# Patient Record
Sex: Male | Born: 1955 | Race: White | Hispanic: No | Marital: Single | Smoking: Former smoker
Health system: Southern US, Community
[De-identification: ages and names within clinical notes are randomized; demographics above are authoritative.]

## PROBLEM LIST (undated history)

## (undated) DIAGNOSIS — E271 Primary adrenocortical insufficiency: Secondary | ICD-10-CM

## (undated) DIAGNOSIS — L97509 Non-pressure chronic ulcer of other part of unspecified foot with unspecified severity: Secondary | ICD-10-CM

## (undated) DIAGNOSIS — E11621 Type 2 diabetes mellitus with foot ulcer: Secondary | ICD-10-CM

## (undated) DIAGNOSIS — E119 Type 2 diabetes mellitus without complications: Secondary | ICD-10-CM

## (undated) DIAGNOSIS — K219 Gastro-esophageal reflux disease without esophagitis: Secondary | ICD-10-CM

## (undated) DIAGNOSIS — F319 Bipolar disorder, unspecified: Secondary | ICD-10-CM

## (undated) DIAGNOSIS — IMO0001 Reserved for inherently not codable concepts without codable children: Secondary | ICD-10-CM

## (undated) DIAGNOSIS — F4310 Post-traumatic stress disorder, unspecified: Secondary | ICD-10-CM

## (undated) DIAGNOSIS — M549 Dorsalgia, unspecified: Secondary | ICD-10-CM

## (undated) DIAGNOSIS — E1142 Type 2 diabetes mellitus with diabetic polyneuropathy: Secondary | ICD-10-CM

## (undated) DIAGNOSIS — Z8719 Personal history of other diseases of the digestive system: Secondary | ICD-10-CM

## (undated) DIAGNOSIS — G43909 Migraine, unspecified, not intractable, without status migrainosus: Secondary | ICD-10-CM

## (undated) DIAGNOSIS — G4733 Obstructive sleep apnea (adult) (pediatric): Secondary | ICD-10-CM

## (undated) DIAGNOSIS — Z8739 Personal history of other diseases of the musculoskeletal system and connective tissue: Secondary | ICD-10-CM

## (undated) DIAGNOSIS — J42 Unspecified chronic bronchitis: Secondary | ICD-10-CM

## (undated) DIAGNOSIS — F32A Depression, unspecified: Secondary | ICD-10-CM

## (undated) DIAGNOSIS — I10 Essential (primary) hypertension: Secondary | ICD-10-CM

## (undated) DIAGNOSIS — J449 Chronic obstructive pulmonary disease, unspecified: Secondary | ICD-10-CM

## (undated) DIAGNOSIS — Z8711 Personal history of peptic ulcer disease: Secondary | ICD-10-CM

## (undated) DIAGNOSIS — G8929 Other chronic pain: Secondary | ICD-10-CM

## (undated) DIAGNOSIS — F419 Anxiety disorder, unspecified: Secondary | ICD-10-CM

## (undated) DIAGNOSIS — M869 Osteomyelitis, unspecified: Secondary | ICD-10-CM

## (undated) DIAGNOSIS — F209 Schizophrenia, unspecified: Secondary | ICD-10-CM

## (undated) DIAGNOSIS — S069XAA Unspecified intracranial injury with loss of consciousness status unknown, initial encounter: Secondary | ICD-10-CM

## (undated) DIAGNOSIS — Z9989 Dependence on other enabling machines and devices: Secondary | ICD-10-CM

## (undated) DIAGNOSIS — S069X9A Unspecified intracranial injury with loss of consciousness of unspecified duration, initial encounter: Secondary | ICD-10-CM

## (undated) DIAGNOSIS — M1990 Unspecified osteoarthritis, unspecified site: Secondary | ICD-10-CM

## (undated) DIAGNOSIS — F329 Major depressive disorder, single episode, unspecified: Secondary | ICD-10-CM

## (undated) HISTORY — DX: Post-traumatic stress disorder, unspecified: F43.10

## (undated) HISTORY — PX: WRIST FRACTURE SURGERY: SHX121

## (undated) HISTORY — DX: Schizophrenia, unspecified: F20.9

## (undated) HISTORY — DX: Osteomyelitis, unspecified: M86.9

## (undated) HISTORY — DX: Anxiety disorder, unspecified: F41.9

## (undated) HISTORY — DX: Type 2 diabetes mellitus with foot ulcer: E11.621

## (undated) HISTORY — PX: ANKLE SURGERY: SHX546

## (undated) HISTORY — DX: Gastro-esophageal reflux disease without esophagitis: K21.9

## (undated) HISTORY — PX: BELOW KNEE LEG AMPUTATION: SUR23

## (undated) HISTORY — DX: Non-pressure chronic ulcer of other part of unspecified foot with unspecified severity: L97.509

## (undated) HISTORY — DX: Depression, unspecified: F32.A

## (undated) HISTORY — DX: Major depressive disorder, single episode, unspecified: F32.9

---

## 1998-04-15 ENCOUNTER — Emergency Department (HOSPITAL_COMMUNITY): Admission: EM | Admit: 1998-04-15 | Discharge: 1998-04-15 | Payer: Self-pay | Admitting: Emergency Medicine

## 1998-10-03 ENCOUNTER — Encounter: Payer: Self-pay | Admitting: Emergency Medicine

## 1998-10-03 ENCOUNTER — Emergency Department (HOSPITAL_COMMUNITY): Admission: EM | Admit: 1998-10-03 | Discharge: 1998-10-03 | Payer: Self-pay | Admitting: Emergency Medicine

## 1998-10-09 ENCOUNTER — Encounter: Admission: RE | Admit: 1998-10-09 | Discharge: 1998-10-28 | Payer: Self-pay | Admitting: Sports Medicine

## 1998-10-24 ENCOUNTER — Encounter: Payer: Self-pay | Admitting: Sports Medicine

## 1998-10-24 ENCOUNTER — Ambulatory Visit (HOSPITAL_COMMUNITY): Admission: RE | Admit: 1998-10-24 | Discharge: 1998-10-24 | Payer: Self-pay | Admitting: Sports Medicine

## 1998-11-01 ENCOUNTER — Encounter: Payer: Self-pay | Admitting: Sports Medicine

## 1998-11-01 ENCOUNTER — Ambulatory Visit (HOSPITAL_COMMUNITY): Admission: RE | Admit: 1998-11-01 | Discharge: 1998-11-01 | Payer: Self-pay | Admitting: Sports Medicine

## 1998-11-13 ENCOUNTER — Encounter: Admission: RE | Admit: 1998-11-13 | Discharge: 1998-12-03 | Payer: Self-pay | Admitting: Sports Medicine

## 1998-12-13 ENCOUNTER — Inpatient Hospital Stay (HOSPITAL_COMMUNITY): Admission: EM | Admit: 1998-12-13 | Discharge: 1998-12-17 | Payer: Self-pay | Admitting: Emergency Medicine

## 1998-12-13 ENCOUNTER — Encounter: Payer: Self-pay | Admitting: Emergency Medicine

## 1998-12-30 ENCOUNTER — Encounter: Admission: RE | Admit: 1998-12-30 | Discharge: 1999-02-26 | Payer: Self-pay | Admitting: Sports Medicine

## 1998-12-31 ENCOUNTER — Encounter: Admission: RE | Admit: 1998-12-31 | Discharge: 1998-12-31 | Payer: Self-pay | Admitting: Family Medicine

## 1999-05-20 ENCOUNTER — Ambulatory Visit (HOSPITAL_COMMUNITY): Admission: RE | Admit: 1999-05-20 | Discharge: 1999-05-20 | Payer: Self-pay | Admitting: Internal Medicine

## 1999-05-20 ENCOUNTER — Encounter: Payer: Self-pay | Admitting: Internal Medicine

## 1999-10-08 ENCOUNTER — Encounter: Payer: Self-pay | Admitting: Oral Surgery

## 1999-10-08 ENCOUNTER — Encounter: Admission: RE | Admit: 1999-10-08 | Discharge: 1999-10-08 | Payer: Self-pay | Admitting: Oral Surgery

## 2000-02-09 ENCOUNTER — Ambulatory Visit (HOSPITAL_COMMUNITY): Admission: RE | Admit: 2000-02-09 | Discharge: 2000-02-09 | Payer: Self-pay | Admitting: *Deleted

## 2004-01-20 ENCOUNTER — Ambulatory Visit: Payer: Self-pay | Admitting: Family Medicine

## 2004-05-10 ENCOUNTER — Ambulatory Visit: Payer: Self-pay | Admitting: Family Medicine

## 2010-10-01 ENCOUNTER — Emergency Department (HOSPITAL_COMMUNITY): Payer: Medicaid Other

## 2010-10-01 ENCOUNTER — Emergency Department (HOSPITAL_COMMUNITY)
Admission: EM | Admit: 2010-10-01 | Discharge: 2010-10-01 | Disposition: A | Discharge status: Home / Self Care | Payer: Medicaid Other | Attending: Emergency Medicine | Admitting: Emergency Medicine

## 2010-10-01 DIAGNOSIS — Z5900 Homelessness unspecified: Secondary | ICD-10-CM | POA: Insufficient documentation

## 2010-10-01 DIAGNOSIS — E1149 Type 2 diabetes mellitus with other diabetic neurological complication: Secondary | ICD-10-CM | POA: Insufficient documentation

## 2010-10-01 DIAGNOSIS — E1142 Type 2 diabetes mellitus with diabetic polyneuropathy: Secondary | ICD-10-CM | POA: Insufficient documentation

## 2010-10-01 LAB — DIFFERENTIAL
Basophils Absolute: 0 10*3/uL (ref 0.0–0.1)
Basophils Relative: 0 % (ref 0–1)
Eosinophils Absolute: 0.1 10*3/uL (ref 0.0–0.7)
Eosinophils Relative: 1 % (ref 0–5)
Lymphocytes Relative: 19 % (ref 12–46)
Lymphs Abs: 1.5 10*3/uL (ref 0.7–4.0)
Monocytes Absolute: 0.5 10*3/uL (ref 0.1–1.0)
Monocytes Relative: 6 % (ref 3–12)
Neutro Abs: 5.8 10*3/uL (ref 1.7–7.7)
Neutrophils Relative %: 74 % (ref 43–77)

## 2010-10-01 LAB — COMPREHENSIVE METABOLIC PANEL
ALT: 16 U/L (ref 0–53)
Alkaline Phosphatase: 92 U/L (ref 39–117)
BUN: 9 mg/dL (ref 6–23)
CO2: 22 mEq/L (ref 19–32)
Chloride: 86 mEq/L — ABNORMAL LOW (ref 96–112)
GFR calc Af Amer: >60 mL/min (ref >60)
GFR calc non Af Amer: >60 mL/min (ref >60)
Glucose, Bld: 590 mg/dL (ref 70–99)
Potassium: 4.4 mEq/L (ref 3.5–5.1)
Sodium: 122 mEq/L — ABNORMAL LOW (ref 135–145)
Total Bilirubin: 0.4 mg/dL (ref 0.3–1.2)
Total Protein: 6.3 g/dL (ref 6.0–8.3)

## 2010-10-01 LAB — CBC
HCT: 34.7 % — ABNORMAL LOW (ref 39.0–52.0)
Hemoglobin: 12 g/dL — ABNORMAL LOW (ref 13.0–17.0)
MCH: 28.8 pg (ref 26.0–34.0)
MCHC: 34.6 g/dL (ref 30.0–36.0)
MCV: 83.4 fL (ref 78.0–100.0)
Platelets: 356 10*3/uL (ref 150–400)
RBC: 4.16 MIL/uL — ABNORMAL LOW (ref 4.22–5.81)
RDW: 13.3 % (ref 11.5–15.5)
WBC: 7.9 10*3/uL (ref 4.0–10.5)

## 2010-10-01 LAB — URINALYSIS, ROUTINE W REFLEX MICROSCOPIC
Bilirubin Urine: NEGATIVE
Hgb urine dipstick: NEGATIVE
Ketones, ur: NEGATIVE mg/dL
Nitrite: NEGATIVE
Protein, ur: NEGATIVE mg/dL
Urobilinogen, UA: 0.2 mg/dL (ref 0.0–1.0)

## 2010-10-01 LAB — GLUCOSE, CAPILLARY: Glucose-Capillary: 200 mg/dL — ABNORMAL HIGH (ref 70–99)

## 2010-10-01 LAB — ETHANOL: Alcohol, Ethyl (B): <11 mg/dL (ref 0–11)

## 2010-10-01 LAB — RAPID URINE DRUG SCREEN, HOSP PERFORMED
Amphetamines: NOT DETECTED
Barbiturates: NOT DETECTED
Opiates: NOT DETECTED
Tetrahydrocannabinol: NOT DETECTED

## 2010-10-15 ENCOUNTER — Emergency Department (HOSPITAL_COMMUNITY): Payer: Medicaid Other

## 2010-10-15 ENCOUNTER — Inpatient Hospital Stay (HOSPITAL_COMMUNITY)
Admission: EM | Admit: 2010-10-15 | Discharge: 2010-10-24 | DRG: 041 | Disposition: A | Discharge status: Skilled Nursing Facility | Payer: Medicaid Other | Attending: Family Medicine | Admitting: Family Medicine

## 2010-10-15 DIAGNOSIS — F410 Panic disorder [episodic paroxysmal anxiety] without agoraphobia: Secondary | ICD-10-CM | POA: Diagnosis present

## 2010-10-15 DIAGNOSIS — A4901 Methicillin susceptible Staphylococcus aureus infection, unspecified site: Secondary | ICD-10-CM | POA: Diagnosis present

## 2010-10-15 DIAGNOSIS — L97409 Non-pressure chronic ulcer of unspecified heel and midfoot with unspecified severity: Secondary | ICD-10-CM | POA: Diagnosis present

## 2010-10-15 DIAGNOSIS — L02619 Cutaneous abscess of unspecified foot: Secondary | ICD-10-CM | POA: Diagnosis present

## 2010-10-15 DIAGNOSIS — E1149 Type 2 diabetes mellitus with other diabetic neurological complication: Principal | ICD-10-CM | POA: Diagnosis present

## 2010-10-15 DIAGNOSIS — F209 Schizophrenia, unspecified: Secondary | ICD-10-CM | POA: Diagnosis present

## 2010-10-15 DIAGNOSIS — K219 Gastro-esophageal reflux disease without esophagitis: Secondary | ICD-10-CM | POA: Diagnosis present

## 2010-10-15 DIAGNOSIS — Z7982 Long term (current) use of aspirin: Secondary | ICD-10-CM

## 2010-10-15 DIAGNOSIS — E871 Hypo-osmolality and hyponatremia: Secondary | ICD-10-CM | POA: Diagnosis present

## 2010-10-15 DIAGNOSIS — I10 Essential (primary) hypertension: Secondary | ICD-10-CM | POA: Diagnosis present

## 2010-10-15 DIAGNOSIS — L03119 Cellulitis of unspecified part of limb: Secondary | ICD-10-CM | POA: Diagnosis present

## 2010-10-15 DIAGNOSIS — D72829 Elevated white blood cell count, unspecified: Secondary | ICD-10-CM | POA: Diagnosis present

## 2010-10-15 DIAGNOSIS — Z794 Long term (current) use of insulin: Secondary | ICD-10-CM

## 2010-10-15 DIAGNOSIS — M9080 Osteopathy in diseases classified elsewhere, unspecified site: Secondary | ICD-10-CM | POA: Diagnosis present

## 2010-10-15 DIAGNOSIS — E876 Hypokalemia: Secondary | ICD-10-CM | POA: Diagnosis present

## 2010-10-15 DIAGNOSIS — IMO0002 Reserved for concepts with insufficient information to code with codable children: Secondary | ICD-10-CM | POA: Diagnosis present

## 2010-10-15 DIAGNOSIS — F4310 Post-traumatic stress disorder, unspecified: Secondary | ICD-10-CM | POA: Diagnosis present

## 2010-10-15 DIAGNOSIS — D62 Acute posthemorrhagic anemia: Secondary | ICD-10-CM | POA: Diagnosis not present

## 2010-10-15 DIAGNOSIS — G4733 Obstructive sleep apnea (adult) (pediatric): Secondary | ICD-10-CM | POA: Diagnosis present

## 2010-10-15 DIAGNOSIS — M869 Osteomyelitis, unspecified: Secondary | ICD-10-CM | POA: Diagnosis present

## 2010-10-15 DIAGNOSIS — E1142 Type 2 diabetes mellitus with diabetic polyneuropathy: Secondary | ICD-10-CM | POA: Diagnosis present

## 2010-10-15 DIAGNOSIS — E1165 Type 2 diabetes mellitus with hyperglycemia: Secondary | ICD-10-CM | POA: Diagnosis present

## 2010-10-15 LAB — CBC
Hemoglobin: 13.4 g/dL (ref 13.0–17.0)
Platelets: 403 10*3/uL — ABNORMAL HIGH (ref 150–400)
RBC: 4.59 MIL/uL (ref 4.22–5.81)
WBC: 16.5 10*3/uL — ABNORMAL HIGH (ref 4.0–10.5)

## 2010-10-15 LAB — DIFFERENTIAL
Basophils Relative: 0 % (ref 0–1)
Eosinophils Absolute: 0 10*3/uL (ref 0.0–0.7)
Neutro Abs: 13.9 10*3/uL — ABNORMAL HIGH (ref 1.7–7.7)
Neutrophils Relative %: 84 % — ABNORMAL HIGH (ref 43–77)

## 2010-10-15 LAB — URINALYSIS, ROUTINE W REFLEX MICROSCOPIC
Glucose, UA: >1000 mg/dL — AB
Hgb urine dipstick: NEGATIVE
Ketones, ur: 40 mg/dL — AB
Leukocytes, UA: NEGATIVE
Protein, ur: NEGATIVE mg/dL
pH: 5 (ref 5.0–8.0)

## 2010-10-15 LAB — URINE MICROSCOPIC-ADD ON

## 2010-10-15 LAB — BASIC METABOLIC PANEL
CO2: 25 mEq/L (ref 19–32)
Chloride: 83 mEq/L — ABNORMAL LOW (ref 96–112)
Glucose, Bld: 418 mg/dL — ABNORMAL HIGH (ref 70–99)
Potassium: 4.2 mEq/L (ref 3.5–5.1)
Sodium: 121 mEq/L — ABNORMAL LOW (ref 135–145)

## 2010-10-15 LAB — POCT I-STAT 3, VENOUS BLOOD GAS (G3P V)
Bicarbonate: 27.4 mEq/L — ABNORMAL HIGH (ref 20.0–24.0)
TCO2: 29 mmol/L (ref 0–100)
pCO2, Ven: 40 mmHg — ABNORMAL LOW (ref 45.0–50.0)
pH, Ven: 7.444 — ABNORMAL HIGH (ref 7.250–7.300)
pO2, Ven: 29 mmHg — CL (ref 30.0–45.0)

## 2010-10-16 LAB — BASIC METABOLIC PANEL
BUN: 9 mg/dL (ref 6–23)
CO2: 27 mEq/L (ref 19–32)
Calcium: 8.7 mg/dL (ref 8.4–10.5)
Calcium: 8.6 mg/dL (ref 8.4–10.5)
Chloride: 92 mEq/L — ABNORMAL LOW (ref 96–112)
Creatinine, Ser: 0.53 mg/dL (ref 0.50–1.35)
Creatinine, Ser: 0.56 mg/dL (ref 0.50–1.35)
GFR calc Af Amer: >60 mL/min (ref >60)
GFR calc non Af Amer: >60 mL/min (ref >60)

## 2010-10-16 LAB — GLUCOSE, CAPILLARY
Glucose-Capillary: 251 mg/dL — ABNORMAL HIGH (ref 70–99)
Glucose-Capillary: 179 mg/dL — ABNORMAL HIGH (ref 70–99)
Glucose-Capillary: 151 mg/dL — ABNORMAL HIGH (ref 70–99)
Glucose-Capillary: 281 mg/dL — ABNORMAL HIGH (ref 70–99)
Glucose-Capillary: 284 mg/dL — ABNORMAL HIGH (ref 70–99)
Glucose-Capillary: 207 mg/dL — ABNORMAL HIGH (ref 70–99)

## 2010-10-16 LAB — HEMOGLOBIN A1C
Hgb A1c MFr Bld: 14.1 % — ABNORMAL HIGH
Mean Plasma Glucose: 358 mg/dL — ABNORMAL HIGH

## 2010-10-16 LAB — LIPID PANEL
Cholesterol: 129 mg/dL (ref 0–200)
LDL Cholesterol: 74 mg/dL (ref 0–99)
VLDL: 13 mg/dL (ref 0–40)

## 2010-10-16 LAB — URIC ACID: Uric Acid, Serum: 2.1 mg/dL — ABNORMAL LOW (ref 4.0–7.8)

## 2010-10-16 LAB — CORTISOL-AM, BLOOD: Cortisol - AM: 14.4 ug/dL (ref 4.3–22.4)

## 2010-10-16 LAB — OSMOLALITY
Osmolality: 268 mosm/kg — ABNORMAL LOW (ref 275–300)
Osmolality: 272 mosm/kg — ABNORMAL LOW (ref 275–300)

## 2010-10-17 ENCOUNTER — Inpatient Hospital Stay (HOSPITAL_COMMUNITY): Payer: Medicaid Other

## 2010-10-17 LAB — GLUCOSE, CAPILLARY
Glucose-Capillary: 146 mg/dL — ABNORMAL HIGH (ref 70–99)
Glucose-Capillary: 194 mg/dL — ABNORMAL HIGH (ref 70–99)

## 2010-10-17 LAB — COMPREHENSIVE METABOLIC PANEL
ALT: 8 U/L (ref 0–53)
Calcium: 8.7 mg/dL (ref 8.4–10.5)
Creatinine, Ser: 0.57 mg/dL (ref 0.50–1.35)
GFR calc Af Amer: >60 mL/min (ref >60)
Glucose, Bld: 241 mg/dL — ABNORMAL HIGH (ref 70–99)
Sodium: 133 mEq/L — ABNORMAL LOW (ref 135–145)
Total Protein: 5.7 g/dL — ABNORMAL LOW (ref 6.0–8.3)

## 2010-10-17 LAB — CBC
Hemoglobin: 12 g/dL — ABNORMAL LOW (ref 13.0–17.0)
MCH: 28.8 pg (ref 26.0–34.0)
MCHC: 35 g/dL (ref 30.0–36.0)
MCV: 82.5 fL (ref 78.0–100.0)
Platelets: 387 10*3/uL (ref 150–400)

## 2010-10-17 MED ORDER — GADOBENATE DIMEGLUMINE 529 MG/ML IV SOLN
15.0000 mL | Freq: Once | INTRAVENOUS | Status: AC | PRN
  Administered 2010-10-17: 15 mL via INTRAVENOUS

## 2010-10-18 ENCOUNTER — Inpatient Hospital Stay (HOSPITAL_COMMUNITY): Payer: Medicaid Other

## 2010-10-18 LAB — PROTIME-INR
INR: 1.01 (ref 0.00–1.49)
Prothrombin Time: 13.5 seconds (ref 11.6–15.2)

## 2010-10-18 LAB — BASIC METABOLIC PANEL
BUN: 4 mg/dL — ABNORMAL LOW (ref 6–23)
CO2: 27 mEq/L (ref 19–32)
Calcium: 8.7 mg/dL (ref 8.4–10.5)
Creatinine, Ser: 0.54 mg/dL (ref 0.50–1.35)
GFR calc non Af Amer: >60 mL/min (ref >60)
Glucose, Bld: 194 mg/dL — ABNORMAL HIGH (ref 70–99)
Sodium: 134 mEq/L — ABNORMAL LOW (ref 135–145)

## 2010-10-18 LAB — CBC
HCT: 33.5 % — ABNORMAL LOW (ref 39.0–52.0)
Hemoglobin: 11.6 g/dL — ABNORMAL LOW (ref 13.0–17.0)
MCH: 28.6 pg (ref 26.0–34.0)
MCHC: 34.6 g/dL (ref 30.0–36.0)
MCV: 82.5 fL (ref 78.0–100.0)
RBC: 4.06 MIL/uL — ABNORMAL LOW (ref 4.22–5.81)

## 2010-10-18 LAB — WOUND CULTURE

## 2010-10-18 LAB — GLUCOSE, CAPILLARY: Glucose-Capillary: 253 mg/dL — ABNORMAL HIGH (ref 70–99)

## 2010-10-18 LAB — APTT: aPTT: 30 seconds (ref 24–37)

## 2010-10-18 LAB — LIPID PANEL
LDL Cholesterol: 73 mg/dL (ref 0–99)
VLDL: 20 mg/dL (ref 0–40)

## 2010-10-19 ENCOUNTER — Other Ambulatory Visit: Payer: Self-pay | Admitting: Orthopedic Surgery

## 2010-10-19 DIAGNOSIS — L02619 Cutaneous abscess of unspecified foot: Secondary | ICD-10-CM

## 2010-10-19 DIAGNOSIS — E1169 Type 2 diabetes mellitus with other specified complication: Secondary | ICD-10-CM

## 2010-10-19 DIAGNOSIS — L03119 Cellulitis of unspecified part of limb: Secondary | ICD-10-CM

## 2010-10-19 HISTORY — PX: FOOT TENDON TRANSFER: SHX1671

## 2010-10-19 HISTORY — PX: BONE RESECTION: SHX897

## 2010-10-19 LAB — CBC
Platelets: 435 10*3/uL — ABNORMAL HIGH (ref 150–400)
RBC: 3.91 MIL/uL — ABNORMAL LOW (ref 4.22–5.81)
WBC: 6.7 10*3/uL (ref 4.0–10.5)

## 2010-10-19 LAB — BASIC METABOLIC PANEL
CO2: 26 mEq/L (ref 19–32)
Chloride: 102 mEq/L (ref 96–112)
Potassium: 3 mEq/L — ABNORMAL LOW (ref 3.5–5.1)
Sodium: 137 mEq/L (ref 135–145)

## 2010-10-19 LAB — GLUCOSE, CAPILLARY
Glucose-Capillary: 138 mg/dL — ABNORMAL HIGH (ref 70–99)
Glucose-Capillary: 148 mg/dL — ABNORMAL HIGH (ref 70–99)
Glucose-Capillary: 184 mg/dL — ABNORMAL HIGH (ref 70–99)
Glucose-Capillary: 177 mg/dL — ABNORMAL HIGH (ref 70–99)

## 2010-10-19 NOTE — H&P (Signed)
NAME:  [name], [name] NO.:  [id number]  MEDICAL RECORD NO.:  [id number]  LOCATION:  [facility]                         FACILITY:  [facility]  PHYSICIAN:  Eduard Clos, MDDATE OF BIRTH:  Aug 30, 1955  DATE OF ADMISSION:  10/15/2010 DATE OF DISCHARGE:                             HISTORY & PHYSICAL   The patient's primary care physician is Dr. Della Goo.  CHIEF COMPLAINT:  Left foot swelling.  HISTORY OF PRESENT ILLNESS:  A 55 year old male with known history of diabetes mellitus type 2, hypertension, neuropathy, post traumatic stress disorder, schizophrenia, sleep apnea, not on CPAP presented to the ER because of increasing swelling of the left foot over the last 2 days.  In the ER the patient was noticed to have swelling which the ER PA, Josh Geiple did I and D.  He stated minimal pus came out but the whole lesion was around 5 cm in diameter.  X-ray did not show anything involving the bone.  The patient had leukocytosis of 16 and blood sugar of 418 on admission.  The patient is admitted for further workup on his left foot cellulitis and abscess with uncontrolled diabetes.  The patient states that he has been taking his medications regularly. He said he did have some subjective feeling of fever or chills.  Denies any chest pain, shortness of breath, nausea, vomiting, abdominal pain, dysuria, discharge, or diarrhea.  Denies any focal deficit, headache, or visual symptoms.  PAST MEDICAL HISTORY:  History of OSA.  The patient has not been using his CPAP.  History of migraine headache, history of diabetic neuropathy, history of panic attack, history of post-traumatic stress disorder, history of schizophrenia, history of diabetes mellitus type 2, and history of hypertension.  PAST SURGICAL HISTORY:  Right ankle surgery for fracture.  MEDICATIONS ON ADMISSION:  The patient states that he takes lisinopril, metformin 500 mg in the morning and 1000 at bedtime,  Nexium, fluoxetine, Benadryl, amitriptyline.  We need to verify all the medication doses.  ALLERGIES:  No known drug allergies.  FAMILY HISTORY:  Positive for lung cancer and heart attack and diabetes in his dad.  SOCIAL HISTORY:  The patient lives with his mom.  Denies smoking cigarettes, drinking alcohol, or using illegal drugs.  REVIEW OF SYSTEMS:  As per the history of present illness, nothing else significant.  PHYSICAL EXAMINATION:  GENERAL:  The patient examined at bedside not in acute distress. VITAL SIGNS:  Blood pressure 149/90, pulse 85 per minute, temperature 97.3, respirations 18 per minute, O2 sat 100%. HEENT: Anicteric.  No pallor.  No discharge from ears, eyes, nose, or mouth. CHEST:  Bilateral air entry present.  No rhonchi.  No crepitation. HEART:  S1 and S2. ABDOMEN:  Soft and nontender.  Bowel sounds heard. CNS:  Patient is alert, awake, and oriented to time, place, and person. Moves upper and lower extremities 5/5.  EXTREMITIES:  There is mild swelling of his left foot, on the dorsal aspect which is already addressed, After I and D., it is mildly tender to touch.  There is no acute ischemic changes, cyanosis, or clubbing.  The right foot looks normal.  LABORATORY DATA:  CBC:  WBC 16.5, hemoglobin is  13.4, hematocrit 37.3, platelets 403, neutrophils 84%.  Basic metabolic panel sodium 121, potassium 4.2, chloride 83, carbon dioxide 25, glucose 418 anion gap is 13, BUN 10, creatinine 0.6, calcium 9.4.  UA appears cloudy more than 1000 glucose, ketones 40, negative for nitrites and leukocytes.  X-ray of the left foot shows no acute fracture or subluxation.  No periosteal reaction or bony erosion.  Soft tissue swelling adjacent to the base of the fifth metatarsal the patient did have a CT of the head without contrast on October 01, 2010, that was negative for anything acute.  ASSESSMENT: 1. Left foot abscess and cellulitis. 2. Severe hyperglycemia.  Secondary  to uncontrolled diabetes mellitus     type 2, not in diabetic ketoacidosis. 3. Hyponatremia. 4  History of schizophrenia, depression, anxiety and post-traumatic stress disorder. 1. History of obstructive sleep apnea, he has not been using CPAP. 2. History of hypertension. 3. History of  migraine headaches.  PLAN: 1. At this time, we will admit the patient to telemetry. 2. For his left foot abscess and cellulitis.  The patient already had     an I and D.  I am going to get an MRI of the left foot to make sure     there is no osteomyelitis.  We will continue with the Zenaida Niece and Zosyn     and the I and D specimen is sent for cultures and 70 which we will     follow. 3. For his severe hyperglycemia with uncontrolled diabetes.  The     patient was on Glucommander and his blood sugar had not decreased     to 179 and we have stopped the Glucommander.  I am going to give     Lantus 10 units.  We will check a hemoglobin A1c at this time the     patient will be on CBG coverage with moderate sliding scale with no     bedtime coverage. 4. Severe hyponatremia.  I do not know the exact cause for it at this     time, I am hydrating the patient with normal saline.  We are going     to check a stat urine sodium osmolality and chloride and we will     also check plasma protein and cortisol level and uric acid level.     I am going to get a stat BMET and we will be following BMET q.4     hourly for next 4 times to see how his sodium is trending.  Based     on these, we will see if patient needs to     be restricted fluids or to be given IV fluids. 5. Hypertension.  We need to verify his home medication at this time,     we will keep patient n.p.o. on IV hydralazine.  Further     recommendation based on test order and clinic course.     Eduard Clos, MD     ANK/MEDQ  D:  10/16/2010  T:  10/16/2010  Job:  [id number]  cc:   Della Goo, M.D.  Electronically Signed by Midge Minium MD  on 10/19/2010 09:49:48 AM

## 2010-10-20 HISTORY — PX: INCISE AND DRAIN ABCESS: PRO64

## 2010-10-20 LAB — CBC
Hemoglobin: 10.1 g/dL — ABNORMAL LOW (ref 13.0–17.0)
MCV: 84 fL (ref 78.0–100.0)
Platelets: 386 10*3/uL (ref 150–400)
RBC: 3.5 MIL/uL — ABNORMAL LOW (ref 4.22–5.81)
WBC: 8.1 10*3/uL (ref 4.0–10.5)

## 2010-10-20 LAB — BASIC METABOLIC PANEL
CO2: 26 mEq/L (ref 19–32)
Calcium: 8.8 mg/dL (ref 8.4–10.5)
Creatinine, Ser: 0.6 mg/dL (ref 0.50–1.35)
Glucose, Bld: 138 mg/dL — ABNORMAL HIGH (ref 70–99)

## 2010-10-20 LAB — GLUCOSE, CAPILLARY
Glucose-Capillary: 147 mg/dL — ABNORMAL HIGH (ref 70–99)
Glucose-Capillary: 145 mg/dL — ABNORMAL HIGH (ref 70–99)
Glucose-Capillary: 96 mg/dL (ref 70–99)

## 2010-10-20 NOTE — Op Note (Signed)
[name], [name] NO.:  [id number]  MEDICAL RECORD NO.:  [id number]  LOCATION:  2036                         FACILITY:  [facility]  PHYSICIAN:  Toni Arthurs, MD        DATE OF BIRTH:  1955-04-08  DATE OF PROCEDURE:  10/19/2010 DATE OF DISCHARGE:                              OPERATIVE REPORT   DATE OF SURGERY:  October 19, 2010.  PREOPERATIVE DIAGNOSES: 1. Left fifth metatarsal base osteomyelitis. 2. Left foot diabetic ulcer. 3. Left foot deep abscess.  POSTOPERATIVE DIAGNOSES: 1. Left fifth metatarsal base osteomyelitis. 2. Left foot diabetic ulcer. 3. Left foot deep abscess.  PROCEDURE: 1. Left ankle peroneus brevis to peroneus longus tendon transfer. 2. Left foot ulcer I and D of superficial and deep tissue including     bone. 3. Left fifth metatarsal base excision. 4. Application of wound VAC to left foot wound.  SURGEON:  Toni Arthurs, M.D.  ANESTHESIA:  General.  IV FLUIDS:  See anesthesia record.  ESTIMATED BLOOD LOSS:  50 mL.  TOURNIQUET TIME:  3 minutes with an ankle Esmarch.  COMPLICATIONS:  None apparent.  DISPOSITION:  Extubated, awake, and stable to recovery.  SPECIMENS:  Deep tissue to Microbiology for culture and Gram stain.  DISPOSITION:  Extubated, awake, and stable to recovery.  INDICATIONS FOR PROCEDURE:  The patient is a 55 year old male with a past medical history significant for type 2 diabetes.  He was admitted to the Internal Medicine Service with cellulitis and a diabetic- foot ulcer. An MRI was obtained showing osteomyelitis of the base of the fifth metatarsal as well as subcutaneous and deep abscesses around the fifth metatarsal base.  He presents now for operative treatment of this condition.  He understands risks of bleeding, infection, nerve damage, blood clots, need for additional surgery, failure to heal, amputation, and death.  He elects to proceed.  PROCEDURE IN DETAIL:  After preoperative consent was  obtained, the correct operative site was identified, the patient was brought to the operating room and placed supine on the operating table and general anesthesia was induced.  The patient was already on therapeutic antibiotics from the Inpatient Ward.  Surgical time-out was taken.  A left lower extremity was then prepped and draped in standard sterile fashion.  The peroneal tendon sheath was identified.  A longitudinal incision was made over the tendon sheath and dissection was carried down through the subcutaneous tissue to the level of the sheath.  The sheath was opened.  Peroneus brevis and peroneus longus tendons were identified.  A curette was used to roughen the surfaces of both tendons. They were then sutured together with horizontal mattress sutures of 2-0 Ethibond.  The wound was irrigated.  The subcutaneous tissue was closed with inverted simple sutures of 3-0 Monocryl.  The skin was closed with horizontal sutures of 2-0 nylon.  At this point, attention was then turned to the ulcer.  This was noted to be 2 cm x 2 cm over the base of the fifth metatarsal.  There is gross purulence noted from the area of the I and D performed in the emergency room.  The wound was ellipsed out with a #10 blade carrying  the incision down to the level of bone at the lateral aspect of the base of the fifth metatarsal.  Subperiosteal dissection was carried along the fifth metatarsal base plantarly and dorsally.  Sagittal saw was then used to transect the fifth metatarsal at its mid-shaft.  The base was then removed in its entirety and passed off the field as a specimen. Portions of the fifth metatarsal base were then taken with a rongeur and passed off as a specimen to Microbiology.  The subcutaneous tissue was then explored and debrided of all necrotic tissue.  The wound was irrigated copiously with 3 liters of normal saline.  Hemostasis was achieved.  A wound VAC sponge was placed into the depths  of the wound and the occlusive dressing was placed over the sponge.  Vacuum was applied and the sponge was noted to have the appropriate seal.  Sterile dressings were applied over the lateral incision.  The entire foot and ankle were then wrapped with Webril and Ace wrap.  The patient was then awakened by Anesthesia and transported to recovery room in stable condition.  FOLLOWUP PLAN:  The patient will remain on therapeutic antibiotics.  He will need a return trip to the operating room in 2-3 days for repeat I and D, and hopefully closure of the wound.     Toni Arthurs, MD     JH/MEDQ  D:  10/19/2010  T:  10/19/2010  Job:  [id number]  Electronically Signed by Toni Arthurs  on 10/20/2010 09:21:35 PM

## 2010-10-21 ENCOUNTER — Inpatient Hospital Stay (HOSPITAL_COMMUNITY): Payer: Medicaid Other

## 2010-10-21 LAB — GLUCOSE, CAPILLARY
Glucose-Capillary: 160 mg/dL — ABNORMAL HIGH (ref 70–99)
Glucose-Capillary: 203 mg/dL — ABNORMAL HIGH (ref 70–99)
Glucose-Capillary: 241 mg/dL — ABNORMAL HIGH (ref 70–99)
Glucose-Capillary: 282 mg/dL — ABNORMAL HIGH (ref 70–99)
Glucose-Capillary: 295 mg/dL — ABNORMAL HIGH (ref 70–99)
Glucose-Capillary: 319 mg/dL — ABNORMAL HIGH (ref 70–99)

## 2010-10-22 ENCOUNTER — Inpatient Hospital Stay (HOSPITAL_COMMUNITY): Payer: Medicaid Other

## 2010-10-22 LAB — COMPREHENSIVE METABOLIC PANEL
Albumin: 2.5 g/dL — ABNORMAL LOW (ref 3.5–5.2)
BUN: 6 mg/dL (ref 6–23)
Creatinine, Ser: 0.57 mg/dL (ref 0.50–1.35)
Total Bilirubin: 0.1 mg/dL — ABNORMAL LOW (ref 0.3–1.2)
Total Protein: 5.5 g/dL — ABNORMAL LOW (ref 6.0–8.3)

## 2010-10-22 LAB — GLUCOSE, CAPILLARY
Glucose-Capillary: 125 mg/dL — ABNORMAL HIGH (ref 70–99)
Glucose-Capillary: 171 mg/dL — ABNORMAL HIGH (ref 70–99)

## 2010-10-22 LAB — TISSUE CULTURE

## 2010-10-22 LAB — CBC
HCT: 28.8 % — ABNORMAL LOW (ref 39.0–52.0)
MCH: 28.7 pg (ref 26.0–34.0)
MCHC: 34.7 g/dL (ref 30.0–36.0)
MCV: 82.8 fL (ref 78.0–100.0)
RDW: 13.5 % (ref 11.5–15.5)

## 2010-10-23 LAB — GLUCOSE, CAPILLARY: Glucose-Capillary: 331 mg/dL — ABNORMAL HIGH (ref 70–99)

## 2010-10-23 NOTE — Op Note (Signed)
[name], COWGILL NO.:  [id number]  MEDICAL RECORD NO.:  [id number]  LOCATION:  2036                         FACILITY:  [facility]  PHYSICIAN:  Toni Arthurs, MD        DATE OF BIRTH:  1955/06/14  DATE OF PROCEDURE:  10/22/2010 DATE OF DISCHARGE:                              OPERATIVE REPORT   PREOPERATIVE DIAGNOSIS:  Left foot diabetic ulcer with underlying osteomyelitis status post incision and drainage and resection of the fifth metatarsal base with wound VAC application on October 19, 2010.  PROCEDURES: 1. Repeat I and D of left foot wound. 2. Application of wound VAC to left foot wound (1 cm x 3 cm).  SURGEON:  Toni Arthurs, MD  ANESTHESIA:  General.  IV FLUIDS:  See anesthesia record.  ESTIMATED BLOOD LOSS:  Minimal.  TOURNIQUET TIME:  0.  COMPLICATIONS:  None apparent.  DISPOSITION:  Extubated awake and stable to recovery.  INDICATIONS FOR PROCEDURE:  The patient is a 55 year old male with uncontrolled diabetes who presented earlier in a week for diabetic foot ulcer with surrounding cellulitis.  I took him to surgery on October 19, 2010, and I and D'ed the diabetic ulcer and resected the fifth metatarsal base which had osteomyelitis per MRI.  He also had a wound VAC placed at that time, he presents now for a planned return to the operating room to I and D the wound again and possibly close it versus place a wound VAC back on the wound.  He understands the risks and benefits of this procedure and elects to proceed.  Specifically, he understands the risks of bleeding, infection, nerve damage, blood clots, need for additional surgery, amputation, and death.  PROCEDURE IN DETAIL:  After preoperative consent was obtained and the correct operative site was identified, the patient was brought to the operating room and placed supine on the operating table.  General anesthesia was induced.  Preoperative antibiotics were administered. Surgical  time-out was taken.  The left lower extremity was prepped and draped in standard sterile fashion.  The patient's previous diabetic ulcer was identified.  The wound was circumferentially debrided from superficial to deep including skin, subcutaneous tissue, muscle, and bone.  The wound was irrigated copiously with 3 liters of normal saline. The underlying soft tissue was closed with inverted simple sutures of 2- 0 Monocryl.  The distal half of the wound was closed with horizontal mattress sutures of 2-0 nylon, proximal end the wound could not be closed in a tension-free manner.  A wound VAC sponge was cut to fit this section of the wound which measured 1 cm x 3 cm and was approximately a centimeter deep.  This wound VAC was placed into the wound and Adaptic was placed over the distal close incision.  A wound VAC sponge was placed over this as well and occlusive dressing was applied.  Vacuum of 125 mmHg was applied through the wound VAC apparatus and this achieved an adequate seal.  Sterile dressings were applied followed by a Cam walker boot.  The patient was then awakened by Anesthesia and transported to recovery room in stable condition.  FOLLOWUP PLAN:  The patient will have wound VAC  changes Monday, Wednesday, Friday.  He will stay on antibiotics per the Primary Team and he will followup with me in clinic in approximately 2 weeks for a wound check.     Toni Arthurs, MD     JH/MEDQ  D:  10/22/2010  T:  10/22/2010  Job:  [id number]  Electronically Signed by Jonny Ruiz Elverda Wendel  on 10/23/2010 08:51:28 AM

## 2010-10-24 DIAGNOSIS — E11621 Type 2 diabetes mellitus with foot ulcer: Secondary | ICD-10-CM

## 2010-10-24 DIAGNOSIS — M869 Osteomyelitis, unspecified: Secondary | ICD-10-CM

## 2010-10-24 HISTORY — DX: Type 2 diabetes mellitus with foot ulcer: E11.621

## 2010-10-24 HISTORY — DX: Osteomyelitis, unspecified: M86.9

## 2010-10-24 LAB — GLUCOSE, CAPILLARY: Glucose-Capillary: 279 mg/dL — ABNORMAL HIGH (ref 70–99)

## 2010-10-24 LAB — ANAEROBIC CULTURE: Gram Stain: NONE SEEN

## 2010-10-29 DIAGNOSIS — E11621 Type 2 diabetes mellitus with foot ulcer: Secondary | ICD-10-CM | POA: Insufficient documentation

## 2010-10-29 DIAGNOSIS — M869 Osteomyelitis, unspecified: Secondary | ICD-10-CM | POA: Insufficient documentation

## 2010-11-06 NOTE — Discharge Summary (Signed)
NAME:  [name], [name] NO.:  [id number]  MEDICAL RECORD NO.:  [id number]  LOCATION:                                 FACILITY:  PHYSICIAN:  Pleas Koch, MD        DATE OF BIRTH:  1955-08-13  DATE OF ADMISSION:  10/16/2010 DATE OF DISCHARGE:                              DISCHARGE SUMMARY   DISCHARGE DIAGNOSES: 1. Left fifth metatarsal base osteomyelitis with left diabetic foot     ulcer and deep abscess status post I and D October 20, 2010 with     revision and closure and wound VAC placement October 22, 2010. 2. Diabetes mellitus with a HbA1c of 48.1. 3. Transient hypokalemia. 4. Gastroesophageal reflux disease on Nexium. 5. Obstructive sleep apnea. 6. History of posttraumatic stress disorder and long psychiatric     history. 7. Hypotension which was transient and now resolved after decreasing     his dose of lisinopril. 8. Status post placement of PICC line, right upper extremity for long-     term antibiotics, currently on day #4 out of 56 of Ancef 2 grams     daily.  DISCHARGE MEDICATIONS: 1. Amitriptyline 100 mg 1 tab q.p.m. 2. Fluoxetine 40 mg 1 tab q.p.m. 3. Benadryl 25 mg 1 tab q.p.m. 4. Flonase 2 sprays b.i.d. 5. Please note change in medication from lisinopril 10 mg to     lisinopril 5 mg secondary to mild hypotension. 6. Aspirin 81 mg q.p.m. 7. Nexium 40 mg 1 cap q.a.m.  MEDICATIONS DISCONTINUED:  Aleve, Bonine, and over-the-counter sleep medicine.  NEW MEDICATIONS: 1. Hydromorphone 0.5 mg q.6 p.r.n., quantity sufficient for 4 days,     prescription written. 2. Hydrocodone/APAP 1 tab p.o. q.6 p.r.n., quantity sufficient for 5     days, 20 tablets given. 3. NovoLog insulin 1-15 units per sensitivity sliding scale coverage     t.i.d. with meals. 4. Lantus 8 units q.p.m. 5. Cefazolin 2 grams q.8 hourly for duration of 52 days to complete a     full 8-week course of antibiotics for osteomyelitis of the foot.  PERTINENT IMAGING  STUDIES: 1. X-ray of foot October 15, 2010 showed no acute fracture subluxation.     No periosteal reaction or bony erosions.  Subsequent soft tissue     swelling adjacent to base of left fifth metatarsal. 2. MRI of foot October 17, 2010 showed cellulitis and myositis.     a.     Soft tissue abscess and osteomyelitis involving the fifth      metatarsal base laterally. 3. Chest x-ray portable October 18, 2010 showed low lung volumes,     minimal left basilar atelectasis.  Final chest x-ray on October 22, 2010 showed no acute cardiopulmonary abnormality.  Right PICC     line in place.  PERTINENT CONSULTS:  Dr. Toni Arthurs of Orthopedics and Infectious Disease both Dr. Paulette Blanch Dam and Dr. Enedina Finner.  This is a very pleasant 55 year old male with multiple medical issues who presented to the emergency room because swelling of foot over the last 2 days.  He was noted to have swelling in the ED and the PA did  an I and D which showed minimal pus but the whole lesion is 5 cm in diameter.  He had leukocytosis of 16, blood sugar of 418, and was admitted for left foot cellulitis and abscess and uncontrolled diabetes mellitus.  The patient endorsed taking medications regularly.  He did have some subjective chills, fever.  No chest pain, shortness of breath. No nausea, vomiting, no discharge, no diarrhea.  No focal deficit or visual symptoms.  VITAL SIGNS ON ADMISSION:  Not in acute distress, blood pressure 149/90, pulse 85, temperature 97.3, respirations 18, O2 sats 100%.  PERTINENT POSITIVES ON ADMISSION:  Mild swelling of left foot on dorsal aspect which already was dressed after I and D.  It is mildly tender to touch.  No acute ischemic changes, cyanosis, clubbing.  Right foot looks normal.  PERTINENT LAB DATA ON ADMISSION:  WBC 16.5, sodium 121, anion gap 13, BUN and creatinine 10/0.6, CO2 is 25.  Pertinent positives above for imaging.  HOSPITAL COURSE ACCORDING TO  ISSUES: 1. Cellulitis/abscess/osteomyelitis.  Dr. Toni Arthurs was consulted     regarding the above issues and performed left ankle peroneus brevis     to peroneus longus tendon transfer.     a.     Left foot ulcer I and D of superficial and deep tissue      including bone.     b.     Left fifth metatarsal base excision.     c.     Application of wound VAC to left foot wound on October 20, 2010.  He did a revision surgery on October 22, 2010 with      application of wound VAC on left wound and is to be seen in about      2 weeks for wound check and is to have wound changes Monday,      Wednesday, Friday per his explicit instructions.  The patient is      doing well from this standpoint.  His WBC today is 6.3 and he is      now on day #4 of day 55 of Ancef q.8.  The duration of antibiotics      and the course of antibiotics were streamlined with the help of      Infectious Disease and the patient will need close followup with      Infectious Disease in about 1-2 weeks.  The patient will need to      follow up in Infectious Disease Clinic at phone number [phone] as an      outpatient and this should be arranged and coordinated through  nursing home physician as well the patient will need to follow up      with Orthopedics, Dr. Victorino Dike in 10 days to 2 weeks at phone number      [phone]. 2. Uncontrolled diabetes mellitus.  A1c of 14.1.  The patient had a     nutrition counseling and there was gross deficit of knowledge with     regards to diet as well as other issues.  The patient will be     discharged to nursing facility.  It is noted that the patient has     very poor vision secondary to not being able to have his reading     glasses and cannot drop insulin on his own and it would be     appropriate to have him monitored closely for insulin needs and     wound needs  at skilled nursing facility as there is a potential for     his wound to decompensate and ultimately lose his left  lower     extremity if tight blood sugar control is not entailed. 3. Hyponatremia, hypokalemia, and other volume related issues were     likely secondary to uncontrolled diabetes mellitus.  His last     sodium is 138 on September5, 2012. 4. GERD.  The patient will continue on Nexium 40 mg daily as a     preferred medication as this helps him better than Protonix. 5. Obstructive sleep apnea.  The patient has a diagnosis of the same     but cannot tolerate CPAP and does use Flonase b.i.d. for this     purpose.  We will continue the same. 6. Multiple psychiatric illnesses.  The patient has PTSD and will be     discharged on his home Prozac and Elavil doses as an outpatient.     He may need some followup with the psychiatrist then.  The patient     was very pleasant and cooperative during this hospitalization. 7. Hypertension.  The patient actually had 1 episode of slight     hypotension which prompted [place] to lower his dose of lisinopril.  He     is doing well currently and is euvolemic.  The patient was seen on day of discharge, was doing well, had no nausea, no vomiting, no chest pain.  He was tolerating full meals.  He was concerned about his foot and the patient was reassured by Dr. Victorino Dike and myself with regards to the fact that he will probably do well if his blood sugars are well controlled.  He will need a nursing facility to monitor him closely and the patient is stable for discharge currently.  DISCHARGE EXAMINATION:  Anxious affect.  No pallor.  No icterus.  Neck is soft, non-supple, nontender.  Chest clinically clear.  No tactile vocal fremitus or resonance.  Abdomen is soft, nontender, nondistended. Wound is wrapped in air stirrup and brace and wound was examined this morning by Dr. Victorino Dike and looked clean to him with the wound VAC not having any significant drainage.  The patient is ambulant with minimal assistance with a walker.  The patient will be discharged to skilled  nursing facility [facility] when bed is available.  It was a pleasure taking care of this patient.  Please note that this dictation is not finalized until signed by dictating physician.          ______________________________ Pleas Koch, MD     JS/MEDQ  D:  10/23/2010  T:  10/23/2010  Job:  [id number]  cc:   Della Goo, M.D. Toni Arthurs, MD Lacretia Leigh. Ninetta Lights, M.D. Acey Lav, MD  Electronically Signed by Pleas Koch MD on 11/06/2010 09:51:30 PM

## 2010-11-06 NOTE — Discharge Summary (Signed)
  NAME:  [name], [name] NO.:  [id number]  MEDICAL RECORD NO.:  [id number]  LOCATION:                                 FACILITY:  PHYSICIAN:  Pleas Koch, MD        DATE OF BIRTH:  12-06-1955  DATE OF ADMISSION: DATE OF DISCHARGE:                              DISCHARGE SUMMARY   ADDENDUM  The patient stayed 1 more day as there was no wound VAC available at nursing facility.  The patient was seen on the day of discharge and was doing great.  Vitals as follows:  Temperature 98.3, pulse 69, respirations 18, blood pressure 120-118/74-80.  He had no concerns and no other issues and will follow up as an outpatient.  He will get wet-to- dry dressings before leaving and will be discharged with PICC line and may discontinue Foley.          ______________________________ Pleas Koch, MD     JS/MEDQ  D:  10/24/2010  T:  10/24/2010  Job:  [id number]  Electronically Signed by Pleas Koch MD on 11/06/2010 09:51:37 PM

## 2010-11-08 ENCOUNTER — Ambulatory Visit (INDEPENDENT_AMBULATORY_CARE_PROVIDER_SITE_OTHER): Payer: Medicaid Other | Admitting: Infectious Disease

## 2010-11-08 ENCOUNTER — Encounter: Payer: Self-pay | Admitting: Infectious Disease

## 2010-11-08 DIAGNOSIS — A4901 Methicillin susceptible Staphylococcus aureus infection, unspecified site: Secondary | ICD-10-CM | POA: Insufficient documentation

## 2010-11-08 DIAGNOSIS — L97509 Non-pressure chronic ulcer of other part of unspecified foot with unspecified severity: Secondary | ICD-10-CM

## 2010-11-08 DIAGNOSIS — L03119 Cellulitis of unspecified part of limb: Secondary | ICD-10-CM

## 2010-11-08 DIAGNOSIS — M869 Osteomyelitis, unspecified: Secondary | ICD-10-CM

## 2010-11-08 DIAGNOSIS — L02612 Cutaneous abscess of left foot: Secondary | ICD-10-CM | POA: Insufficient documentation

## 2010-11-08 DIAGNOSIS — E1169 Type 2 diabetes mellitus with other specified complication: Secondary | ICD-10-CM

## 2010-11-08 DIAGNOSIS — E11621 Type 2 diabetes mellitus with foot ulcer: Secondary | ICD-10-CM

## 2010-11-08 NOTE — Progress Notes (Signed)
  Subjective:    Patient ID: [name], male    DOB: [birth year], 55 y.o.   MRN: [id number]  HPI   55 year old male with known history of diabetes mellitus type 2, hypertension, neuropathy, post traumatic   stress disorder, schizophrenia, sleep apnea who was admitted to [facility] with osteomyelitis of the foot. He underwent two separate I and D of bone by Dr Victorino Dike with I and D of left foot abscess and resection of part of the 5th metatarsal with MSSA growing from cultures. He was narrowed to cefazolin and remains on this since dc in late September. He has been followed closely by Dr. Victorino Dike who has changed him to a wound vaccuum dressing. His orthopedic situation is tenuous and he may in fact ultimately require a BKA for cure. He is planned for 8 weeks of parenteral antibiotics and presents to clinic today for followup. His vaccuum dressing was already deployed and not taken down in anticipation of his visit something that I woul like done for the next visit. Otherwise he appears to be doing relatively well without fevers, chills nausea or systemic symptoms  Review of Systems  Constitutional: Negative for fever, chills, diaphoresis, activity change, appetite change, fatigue and unexpected weight change.  HENT: Negative for congestion, sore throat, rhinorrhea, sneezing, trouble swallowing and sinus pressure.   Eyes: Negative for photophobia and visual disturbance.  Respiratory: Negative for cough, chest tightness, shortness of breath, wheezing and stridor.   Cardiovascular: Negative for chest pain, palpitations and leg swelling.  Gastrointestinal: Negative for nausea, vomiting, abdominal pain, diarrhea, constipation, blood in stool, abdominal distention and anal bleeding.  Genitourinary: Negative for dysuria, hematuria, flank pain and difficulty urinating.  Musculoskeletal: Negative for myalgias, back pain, joint swelling, arthralgias and gait problem.  Skin: Positive for wound. Negative for  color change, pallor and rash.  Neurological: Negative for dizziness, tremors, weakness and light-headedness.  Hematological: Negative for adenopathy. Does not bruise/bleed easily.  Psychiatric/Behavioral: Negative for behavioral problems, confusion, sleep disturbance, dysphoric mood, decreased concentration and agitation.       Objective:   Physical Exam  Constitutional: He is oriented to person, place, and time. He appears well-nourished. No distress.  HENT:  Head: Normocephalic and atraumatic.  Mouth/Throat: Oropharynx is clear and moist. No oropharyngeal exudate.  Eyes: Conjunctivae and EOM are normal. Pupils are equal, round, and reactive to light. No scleral icterus.  Neck: Normal range of motion. Neck supple. No JVD present.  Cardiovascular: Normal rate, regular rhythm and normal heart sounds.  Exam reveals no gallop and no friction rub.   No murmur heard. Pulmonary/Chest: Effort normal and breath sounds normal. No respiratory distress. He has no wheezes. He has no rales. He exhibits no tenderness.  Abdominal: He exhibits no distension and no mass. There is no tenderness. There is no rebound and no guarding.  Musculoskeletal: He exhibits no edema and no tenderness.       Feet:       vaccuum dressing in place. Toes well perfused. Musty odor from dressing  Lymphadenopathy:    He has no cervical adenopathy.  Neurological: He is alert and oriented to person, place, and time. He has normal reflexes. He exhibits normal muscle tone. Coordination normal.  Skin: Skin is warm and dry. He is not diaphoretic. There is erythema. No pallor.  Psychiatric: He has a normal mood and affect. His behavior is normal. Judgment and thought content normal.          Assessment & Plan:

## 2010-11-08 NOTE — Patient Instructions (Addendum)
We will continue the iv antibiotics for now Return to see dr Zenaida Niece dam in late October  if wound vaccuum still being used i would like it taken down before visit with me

## 2010-11-08 NOTE — Assessment & Plan Note (Signed)
Continue IV ancef for minimum of 8 weeks. Bring back to see me at end of OCtober and examine without wound vaccuum, with recheck esr, crp and consider change to po antibiotics. If he failes this approach he will likely need bka

## 2010-11-08 NOTE — Assessment & Plan Note (Signed)
See above plan. 

## 2010-11-08 NOTE — Assessment & Plan Note (Signed)
Consider decontamination regimen int the future

## 2010-11-08 NOTE — Assessment & Plan Note (Signed)
Defer dm to Dr. Leanord Hawking at [facility] : [facility]

## 2010-11-25 ENCOUNTER — Emergency Department (HOSPITAL_COMMUNITY): Payer: Medicaid Other

## 2010-11-25 ENCOUNTER — Emergency Department (HOSPITAL_COMMUNITY)
Admission: EM | Admit: 2010-11-25 | Discharge: 2010-11-25 | Disposition: A | Discharge status: Home / Self Care | Payer: Medicaid Other | Attending: Emergency Medicine | Admitting: Emergency Medicine

## 2010-11-25 DIAGNOSIS — M25519 Pain in unspecified shoulder: Secondary | ICD-10-CM | POA: Insufficient documentation

## 2010-11-25 DIAGNOSIS — F3289 Other specified depressive episodes: Secondary | ICD-10-CM | POA: Insufficient documentation

## 2010-11-25 DIAGNOSIS — F329 Major depressive disorder, single episode, unspecified: Secondary | ICD-10-CM | POA: Insufficient documentation

## 2010-11-25 DIAGNOSIS — Z79899 Other long term (current) drug therapy: Secondary | ICD-10-CM | POA: Insufficient documentation

## 2010-11-25 DIAGNOSIS — E119 Type 2 diabetes mellitus without complications: Secondary | ICD-10-CM | POA: Insufficient documentation

## 2010-11-25 DIAGNOSIS — I10 Essential (primary) hypertension: Secondary | ICD-10-CM | POA: Insufficient documentation

## 2010-12-06 ENCOUNTER — Encounter: Payer: Self-pay | Admitting: Infectious Disease

## 2010-12-08 ENCOUNTER — Encounter: Payer: Self-pay | Admitting: Infectious Disease

## 2010-12-08 ENCOUNTER — Ambulatory Visit (INDEPENDENT_AMBULATORY_CARE_PROVIDER_SITE_OTHER): Payer: Medicaid Other | Admitting: Infectious Disease

## 2010-12-08 VITALS — BP 68/34 | HR 96 | Temp 97.5°F | Wt 155.0 lb

## 2010-12-08 DIAGNOSIS — E1140 Type 2 diabetes mellitus with diabetic neuropathy, unspecified: Secondary | ICD-10-CM

## 2010-12-08 DIAGNOSIS — E1149 Type 2 diabetes mellitus with other diabetic neurological complication: Secondary | ICD-10-CM

## 2010-12-08 DIAGNOSIS — L02612 Cutaneous abscess of left foot: Secondary | ICD-10-CM

## 2010-12-08 DIAGNOSIS — M869 Osteomyelitis, unspecified: Secondary | ICD-10-CM

## 2010-12-08 DIAGNOSIS — A4901 Methicillin susceptible Staphylococcus aureus infection, unspecified site: Secondary | ICD-10-CM

## 2010-12-08 DIAGNOSIS — I10 Essential (primary) hypertension: Secondary | ICD-10-CM | POA: Insufficient documentation

## 2010-12-08 DIAGNOSIS — L02619 Cutaneous abscess of unspecified foot: Secondary | ICD-10-CM

## 2010-12-08 DIAGNOSIS — I959 Hypotension, unspecified: Secondary | ICD-10-CM

## 2010-12-08 DIAGNOSIS — E1142 Type 2 diabetes mellitus with diabetic polyneuropathy: Secondary | ICD-10-CM

## 2010-12-08 NOTE — Assessment & Plan Note (Signed)
Status post I&D seems to be improving we'll set to see this does off antibiotics ultimately.

## 2010-12-08 NOTE — Assessment & Plan Note (Signed)
Check sedimentation rate C-reactive protein today complete 8 weeks of IV Ancef then see him back a month after that.

## 2010-12-08 NOTE — Assessment & Plan Note (Signed)
Needs close followup by his primary care physician

## 2010-12-08 NOTE — Assessment & Plan Note (Signed)
Culprit pathogen

## 2010-12-08 NOTE — Patient Instructions (Signed)
Make sure that you drink plenty of fluids such as gatorade to keep yourself hydrated

## 2010-12-08 NOTE — Assessment & Plan Note (Addendum)
Currently asymptomatic although as far multiple times in the past he has learned to stand closed carefully and slowly. He states he has had chronic low blood pressures which makes her wonder why he was given an ACE inhibitor. He is no longer on lisinopril. I will check a serum cortisol as I wondered whether this patient could have some adrenal insufficiency. We attempted bolus of fluids in the clinic but could not achieve IV access in left arm and could not bolus him through his PICC line per protocol clinic. On repeat blood pressure check of his left leg his blood pressure is noted in the one teens systolic. His infection is well controlled at present.

## 2010-12-08 NOTE — Progress Notes (Signed)
Subjective:    Patient ID: [name], male    DOB: [birth year], 55 y.o.   MRN: [id number]  HPI  55 year old male with known history of diabetes mellitus type 2, hypertension, neuropathy, post traumatic  stress disorder, schizophrenia, sleep apnea who was admitted to [facility] with osteomyelitis of the foot. He underwent two separate I and D of bone by Dr Victorino Dike with I and D of left foot abscess and resection of part of the 5th metatarsal with MSSA growing from cultures. He was narrowed to cefazolin and remains on this since dc in late September. He has been followed closely by Dr. Victorino Dike who has changed him to a wound vaccuum dressing. His orthopedic situation has been tenuous and he may in fact ultimately require a BKA for cure. He was planned for 8 weeks of parenteral antibiotics. I saw him a month ago. He is been seen since by Dr. Londell Moh who is unhappy with how the patient is progressing. Vacuum is been removed. Today his BP initially was in 60s initially on check here in arm but on recheck was in the 110s. He is without symptoms today. He does have a history of multiple falls prior to his admission in [facility]. We spent greater than 50% of time in face to face counselling of the pt and in coordination of his care including atttempts to place an IV and bolus him with IVF prior to the bp check in the leg.   Review of Systems  Constitutional: Negative for fever, chills, diaphoresis, activity change, appetite change, fatigue and unexpected weight change.  HENT: Negative for congestion, sore throat, rhinorrhea, sneezing, trouble swallowing and sinus pressure.   Eyes: Negative for photophobia and visual disturbance.  Respiratory: Negative for cough, chest tightness, shortness of breath, wheezing and stridor.   Cardiovascular: Negative for chest pain, palpitations and leg swelling.  Gastrointestinal: Negative for nausea, vomiting, abdominal pain, diarrhea, constipation, blood in stool, abdominal  distention and anal bleeding.  Genitourinary: Negative for dysuria, hematuria, flank pain and difficulty urinating.  Musculoskeletal: Negative for myalgias, back pain, joint swelling, arthralgias and gait problem.  Skin: Negative for color change, pallor, rash and wound.  Neurological: Negative for dizziness, tremors, weakness and light-headedness.  Hematological: Negative for adenopathy. Does not bruise/bleed easily.  Psychiatric/Behavioral: Negative for behavioral problems, confusion, sleep disturbance, dysphoric mood, decreased concentration and agitation.       Objective:   Physical Exam  Constitutional: He is oriented to person, place, and time. He appears well-developed and well-nourished. No distress.  HENT:  Head: Normocephalic and atraumatic.  Mouth/Throat: Oropharynx is clear and moist. No oropharyngeal exudate.  Eyes: Conjunctivae and EOM are normal. Pupils are equal, round, and reactive to light. No scleral icterus.  Neck: Normal range of motion. Neck supple. No JVD present.  Cardiovascular: Normal rate, regular rhythm and normal heart sounds.  Exam reveals no gallop and no friction rub.   No murmur heard. Pulmonary/Chest: Effort normal and breath sounds normal. No respiratory distress. He has no wheezes. He has no rales. He exhibits no tenderness.  Abdominal: He exhibits no distension and no mass. There is no tenderness. There is no rebound and no guarding.  Musculoskeletal: He exhibits no edema and no tenderness.       Left ankle: He exhibits swelling. tenderness.       Feet:  Lymphadenopathy:    He has no cervical adenopathy.  Neurological: He is alert and oriented to person, place, and time. He has normal reflexes. He exhibits  normal muscle tone. Coordination normal.  Skin: Skin is warm and dry. He is not diaphoretic. There is erythema. No pallor.  Psychiatric: He has a normal mood and affect. His behavior is normal. Judgment and thought content normal.            Assessment & Plan:  Hypotension Currently asymptomatic although as far multiple times in the past he has learned to stand closed carefully and slowly. He states he has had chronic low blood pressures which makes her wonder why he was given an ACE inhibitor. He is no longer on lisinopril. I will check a serum cortisol as I wondered whether this patient could have some adrenal insufficiency. We attempted bolus of fluids in the clinic but could not achieve IV access in left arm and could not bolus him through his PICC line per protocol clinic. On repeat blood pressure check of his left leg his blood pressure is noted in the one teens systolic. His infection is well controlled at present.  Osteomyelitis of toe of left foot Check sedimentation rate C-reactive protein today complete 8 weeks of IV Ancef then see him back a month after that.  Foot abscess, left Status post I&D seems to be improving we'll set to see this does off antibiotics ultimately.  MSSA (methicillin susceptible Staphylococcus aureus) infection Culprit pathogen  Diabetes mellitus with neuropathy Needs close followup by his primary care physician

## 2010-12-09 LAB — CORTISOL: Cortisol, Plasma: 14.8 ug/dL

## 2010-12-09 LAB — SEDIMENTATION RATE: Sed Rate: 4 mm/hr (ref 0–16)

## 2010-12-09 LAB — C-REACTIVE PROTEIN: CRP: 0.56 mg/dL (ref <0.60)

## 2010-12-13 ENCOUNTER — Encounter: Payer: Self-pay | Admitting: Infectious Disease

## 2010-12-13 ENCOUNTER — Telehealth: Payer: Self-pay | Admitting: *Deleted

## 2010-12-13 NOTE — Telephone Encounter (Signed)
Angie, a nurse from [facility] called for orders to pull PICC tomorrow after last dose. Her # is [phone]  Also ,fyi, pt is refusing last set of labs  To Dr. Ninetta Lights as Dr. Daiva Eves is away

## 2010-12-13 NOTE — Telephone Encounter (Signed)
I gave Angie the order to pull the picc after the last dose per Dr. Ninetta Lights. Asked her to have hiim call tomorrow to make an appt for 1 month from now with Dr,. [facility]

## 2011-01-10 ENCOUNTER — Ambulatory Visit (INDEPENDENT_AMBULATORY_CARE_PROVIDER_SITE_OTHER): Payer: Medicaid Other | Admitting: Infectious Disease

## 2011-01-10 ENCOUNTER — Encounter: Payer: Self-pay | Admitting: Infectious Disease

## 2011-01-10 ENCOUNTER — Ambulatory Visit (HOSPITAL_COMMUNITY)
Admission: RE | Admit: 2011-01-10 | Discharge: 2011-01-10 | Disposition: A | Discharge status: Home / Self Care | Payer: Medicaid Other | Source: Ambulatory Visit | Attending: Infectious Disease | Admitting: Infectious Disease

## 2011-01-10 VITALS — BP 78/49 | HR 96 | Temp 97.3°F | Wt 162.1 lb

## 2011-01-10 DIAGNOSIS — E119 Type 2 diabetes mellitus without complications: Secondary | ICD-10-CM | POA: Insufficient documentation

## 2011-01-10 DIAGNOSIS — L02612 Cutaneous abscess of left foot: Secondary | ICD-10-CM

## 2011-01-10 DIAGNOSIS — M869 Osteomyelitis, unspecified: Secondary | ICD-10-CM

## 2011-01-10 DIAGNOSIS — L02619 Cutaneous abscess of unspecified foot: Secondary | ICD-10-CM

## 2011-01-10 LAB — CBC WITH DIFFERENTIAL/PLATELET
Basophils Absolute: 0 10*3/uL (ref 0.0–0.1)
Eosinophils Relative: 3 % (ref 0–5)
Lymphocytes Relative: 23 % (ref 12–46)
Neutro Abs: 4.3 10*3/uL (ref 1.7–7.7)
Neutrophils Relative %: 69 % (ref 43–77)
Platelets: 436 10*3/uL — ABNORMAL HIGH (ref 150–400)
RDW: 15 % (ref 11.5–15.5)
WBC: 6.2 10*3/uL (ref 4.0–10.5)

## 2011-01-10 LAB — BASIC METABOLIC PANEL WITH GFR
Calcium: 9.3 mg/dL (ref 8.4–10.5)
GFR, Est African American: >89 mL/min
Glucose, Bld: 451 mg/dL — ABNORMAL HIGH (ref 70–99)
Sodium: 129 mEq/L — ABNORMAL LOW (ref 135–145)

## 2011-01-10 LAB — SEDIMENTATION RATE: Sed Rate: 1 mm/hr (ref 0–16)

## 2011-01-10 LAB — C-REACTIVE PROTEIN: CRP: 0.09 mg/dL (ref <0.60)

## 2011-01-10 NOTE — Assessment & Plan Note (Signed)
See above plan. He is to see Dr. Samella Parr in mid December

## 2011-01-10 NOTE — Progress Notes (Signed)
  Subjective:    Patient ID: [name], male    DOB: 13-Feb-1956, 55 y.o.   MRN: [id number]  HPI  55 year old male with known history of diabetes mellitus type 2, hypertension, neuropathy, post traumatic  stress disorder, schizophrenia, sleep apnea who was admitted to [facility] with osteomyelitis of the foot. He underwent two separate I and D of bone by Dr Victorino Dike with I and D of left foot abscess and resection of part of the 5th metatarsal with MSSA growing from cultures. He was narrowed to cefazolin and remained  on this since dc in late September. Into October when they were stopped. Since then he continues to have pain in his foot although has not worsened. He has had no fevers or chills return or systemic symptoms. His ankle swelling also persists. Review of Systems  Constitutional: Negative for fever, chills, diaphoresis, activity change, appetite change, fatigue and unexpected weight change.  HENT: Negative for congestion, sore throat, rhinorrhea, sneezing, trouble swallowing and sinus pressure.   Eyes: Negative for photophobia and visual disturbance.  Respiratory: Negative for cough, chest tightness, shortness of breath, wheezing and stridor.   Cardiovascular: Negative for chest pain, palpitations and leg swelling.  Gastrointestinal: Negative for nausea, vomiting, abdominal pain, diarrhea, constipation, blood in stool, abdominal distention and anal bleeding.  Genitourinary: Negative for dysuria, hematuria, flank pain and difficulty urinating.  Musculoskeletal: Positive for myalgias and joint swelling. Negative for back pain, arthralgias and gait problem.  Skin: Positive for wound. Negative for color change, pallor and rash.  Neurological: Negative for dizziness, tremors, weakness and light-headedness.  Hematological: Negative for adenopathy. Does not bruise/bleed easily.  Psychiatric/Behavioral: Negative for behavioral problems, confusion, sleep disturbance, dysphoric mood, decreased  concentration and agitation.       Objective:   Physical Exam  Constitutional: He appears well-developed and well-nourished. No distress.  HENT:  Head: Normocephalic and atraumatic.  Mouth/Throat: Oropharynx is clear and moist. No oropharyngeal exudate.  Eyes: Conjunctivae and EOM are normal. Pupils are equal, round, and reactive to light. No scleral icterus.  Neck: Normal range of motion. Neck supple. No JVD present.  Cardiovascular: Normal rate, regular rhythm and normal heart sounds.  Exam reveals no gallop and no friction rub.   No murmur heard. Pulmonary/Chest: Effort normal and breath sounds normal. No respiratory distress. He has no wheezes. He has no rales. He exhibits no tenderness.  Abdominal: He exhibits no distension and no mass. There is no tenderness. There is no rebound and no guarding.  Musculoskeletal: He exhibits edema and tenderness.       Feet:  Lymphadenopathy:    He has no cervical adenopathy.  Neurological: He is alert. He exhibits normal muscle tone. Coordination normal.  Skin: Skin is warm and dry. He is not diaphoretic. There is erythema. No pallor.  Psychiatric: He has a normal mood and affect. His behavior is normal. Judgment and thought content normal.          Assessment & Plan:  Osteomyelitis of toe of left foot Recheck sedimentation rate C-reactive protein CBC metabolic panel and plain films of the foot and ankle.  Foot abscess, left See above plan. He is to see Dr. Samella Parr in mid December

## 2011-01-10 NOTE — Assessment & Plan Note (Signed)
Recheck sedimentation rate C-reactive protein CBC metabolic panel and plain films of the foot and ankle.

## 2011-02-21 ENCOUNTER — Ambulatory Visit: Payer: Medicaid Other | Admitting: Infectious Disease

## 2011-02-28 ENCOUNTER — Ambulatory Visit: Payer: Medicaid Other | Admitting: Infectious Disease

## 2011-03-04 ENCOUNTER — Ambulatory Visit: Payer: Medicaid Other | Admitting: Infectious Disease

## 2011-03-15 ENCOUNTER — Inpatient Hospital Stay (HOSPITAL_COMMUNITY)
Admission: EM | Admit: 2011-03-15 | Discharge: 2011-03-18 | DRG: 644 | Disposition: A | Discharge status: Home Health | Payer: Medicaid Other | Attending: Internal Medicine | Admitting: Internal Medicine

## 2011-03-15 ENCOUNTER — Inpatient Hospital Stay (HOSPITAL_COMMUNITY): Payer: Medicaid Other

## 2011-03-15 ENCOUNTER — Encounter (HOSPITAL_COMMUNITY): Payer: Self-pay | Admitting: Emergency Medicine

## 2011-03-15 DIAGNOSIS — F209 Schizophrenia, unspecified: Secondary | ICD-10-CM | POA: Diagnosis present

## 2011-03-15 DIAGNOSIS — Z794 Long term (current) use of insulin: Secondary | ICD-10-CM

## 2011-03-15 DIAGNOSIS — K219 Gastro-esophageal reflux disease without esophagitis: Secondary | ICD-10-CM | POA: Diagnosis present

## 2011-03-15 DIAGNOSIS — E1140 Type 2 diabetes mellitus with diabetic neuropathy, unspecified: Secondary | ICD-10-CM | POA: Diagnosis present

## 2011-03-15 DIAGNOSIS — E1142 Type 2 diabetes mellitus with diabetic polyneuropathy: Secondary | ICD-10-CM | POA: Diagnosis present

## 2011-03-15 DIAGNOSIS — E1149 Type 2 diabetes mellitus with other diabetic neurological complication: Secondary | ICD-10-CM | POA: Diagnosis present

## 2011-03-15 DIAGNOSIS — M9080 Osteopathy in diseases classified elsewhere, unspecified site: Secondary | ICD-10-CM | POA: Diagnosis present

## 2011-03-15 DIAGNOSIS — G4733 Obstructive sleep apnea (adult) (pediatric): Secondary | ICD-10-CM | POA: Diagnosis present

## 2011-03-15 DIAGNOSIS — E871 Hypo-osmolality and hyponatremia: Secondary | ICD-10-CM | POA: Diagnosis present

## 2011-03-15 DIAGNOSIS — E236 Other disorders of pituitary gland: Principal | ICD-10-CM | POA: Diagnosis present

## 2011-03-15 DIAGNOSIS — R739 Hyperglycemia, unspecified: Secondary | ICD-10-CM

## 2011-03-15 DIAGNOSIS — E1165 Type 2 diabetes mellitus with hyperglycemia: Secondary | ICD-10-CM | POA: Diagnosis present

## 2011-03-15 DIAGNOSIS — R51 Headache: Secondary | ICD-10-CM | POA: Diagnosis present

## 2011-03-15 DIAGNOSIS — R519 Headache, unspecified: Secondary | ICD-10-CM | POA: Diagnosis present

## 2011-03-15 DIAGNOSIS — Z7982 Long term (current) use of aspirin: Secondary | ICD-10-CM

## 2011-03-15 DIAGNOSIS — F341 Dysthymic disorder: Secondary | ICD-10-CM | POA: Diagnosis present

## 2011-03-15 DIAGNOSIS — R531 Weakness: Secondary | ICD-10-CM | POA: Diagnosis present

## 2011-03-15 DIAGNOSIS — Z23 Encounter for immunization: Secondary | ICD-10-CM

## 2011-03-15 DIAGNOSIS — IMO0002 Reserved for concepts with insufficient information to code with codable children: Secondary | ICD-10-CM | POA: Diagnosis present

## 2011-03-15 DIAGNOSIS — Z79899 Other long term (current) drug therapy: Secondary | ICD-10-CM

## 2011-03-15 DIAGNOSIS — F4310 Post-traumatic stress disorder, unspecified: Secondary | ICD-10-CM | POA: Diagnosis present

## 2011-03-15 DIAGNOSIS — M86679 Other chronic osteomyelitis, unspecified ankle and foot: Secondary | ICD-10-CM | POA: Diagnosis present

## 2011-03-15 LAB — GLUCOSE, CAPILLARY
Glucose-Capillary: 134 mg/dL — ABNORMAL HIGH (ref 70–99)
Glucose-Capillary: 340 mg/dL — ABNORMAL HIGH (ref 70–99)
Glucose-Capillary: 322 mg/dL — ABNORMAL HIGH (ref 70–99)

## 2011-03-15 LAB — BASIC METABOLIC PANEL
CO2: 27 mEq/L (ref 19–32)
Chloride: 86 mEq/L — ABNORMAL LOW (ref 96–112)
Sodium: 122 mEq/L — ABNORMAL LOW (ref 135–145)

## 2011-03-15 LAB — CBC
MCH: 28.4 pg (ref 26.0–34.0)
MCHC: 35.7 g/dL (ref 30.0–36.0)
MCV: 79.3 fL (ref 78.0–100.0)
Platelets: 328 10*3/uL (ref 150–400)
Platelets: 333 10*3/uL (ref 150–400)
RBC: 4.47 MIL/uL (ref 4.22–5.81)
RBC: 4.55 MIL/uL (ref 4.22–5.81)
RDW: 13.4 % (ref 11.5–15.5)
WBC: 4.5 10*3/uL (ref 4.0–10.5)

## 2011-03-15 LAB — SODIUM, URINE, RANDOM: Sodium, Ur: 57 mEq/L

## 2011-03-15 LAB — CREATININE, SERUM: Creatinine, Ser: 0.55 mg/dL (ref 0.50–1.35)

## 2011-03-15 MED ORDER — GABAPENTIN 100 MG PO CAPS
100.0000 mg | ORAL_CAPSULE | Freq: Three times a day (TID) | ORAL | Status: DC
  Administered 2011-03-15 – 2011-03-18 (×9): 100 mg via ORAL
  Filled 2011-03-15 (×11): qty 1

## 2011-03-15 MED ORDER — INSULIN REGULAR HUMAN 100 UNIT/ML IJ SOLN
10.0000 [IU] | Freq: Once | INTRAMUSCULAR | Status: DC
  Filled 2011-03-15: qty 0.1

## 2011-03-15 MED ORDER — DIPHENHYDRAMINE HCL (SLEEP) 25 MG PO TABS: 25.0000 mg | ORAL_TABLET | Freq: Every evening | ORAL | Status: DC | PRN

## 2011-03-15 MED ORDER — INSULIN GLARGINE 100 UNIT/ML ~~LOC~~ SOLN
12.0000 [IU] | Freq: Every day | SUBCUTANEOUS | Status: DC
  Administered 2011-03-15: 12 [IU] via SUBCUTANEOUS
  Filled 2011-03-15: qty 3

## 2011-03-15 MED ORDER — PANTOPRAZOLE SODIUM 40 MG PO TBEC
80.0000 mg | DELAYED_RELEASE_TABLET | Freq: Every day | ORAL | Status: DC
  Administered 2011-03-15 – 2011-03-17 (×3): 80 mg via ORAL
  Filled 2011-03-15 (×3): qty 2

## 2011-03-15 MED ORDER — ONDANSETRON HCL 4 MG PO TABS: 4.0000 mg | ORAL_TABLET | Freq: Four times a day (QID) | ORAL | Status: DC | PRN

## 2011-03-15 MED ORDER — ENOXAPARIN SODIUM 40 MG/0.4ML ~~LOC~~ SOLN
40.0000 mg | SUBCUTANEOUS | Status: DC
  Administered 2011-03-15 – 2011-03-17 (×3): 40 mg via SUBCUTANEOUS
  Filled 2011-03-15 (×4): qty 0.4

## 2011-03-15 MED ORDER — FLUOXETINE HCL 20 MG PO CAPS
60.0000 mg | ORAL_CAPSULE | Freq: Every day | ORAL | Status: DC
  Administered 2011-03-15 – 2011-03-18 (×4): 60 mg via ORAL
  Filled 2011-03-15 (×4): qty 3

## 2011-03-15 MED ORDER — METOCLOPRAMIDE HCL 5 MG/ML IJ SOLN
10.0000 mg | Freq: Once | INTRAMUSCULAR | Status: AC
  Administered 2011-03-15: 10 mg via INTRAVENOUS
  Filled 2011-03-15: qty 2

## 2011-03-15 MED ORDER — INSULIN ASPART 100 UNIT/ML ~~LOC~~ SOLN
0.0000 [IU] | Freq: Every day | SUBCUTANEOUS | Status: DC
  Administered 2011-03-15: 4 [IU] via SUBCUTANEOUS

## 2011-03-15 MED ORDER — ASPIRIN EC 81 MG PO TBEC
81.0000 mg | DELAYED_RELEASE_TABLET | Freq: Every evening | ORAL | Status: DC
  Administered 2011-03-15 – 2011-03-17 (×3): 81 mg via ORAL
  Filled 2011-03-15 (×4): qty 1

## 2011-03-15 MED ORDER — ONDANSETRON HCL 4 MG/2ML IJ SOLN: 4.0000 mg | Freq: Four times a day (QID) | INTRAMUSCULAR | Status: DC | PRN

## 2011-03-15 MED ORDER — INSULIN ASPART 100 UNIT/ML ~~LOC~~ SOLN
10.0000 [IU] | Freq: Once | SUBCUTANEOUS | Status: AC
  Administered 2011-03-15: 10 [IU] via INTRAVENOUS

## 2011-03-15 MED ORDER — INSULIN ASPART 100 UNIT/ML ~~LOC~~ SOLN
5.0000 [IU] | Freq: Three times a day (TID) | SUBCUTANEOUS | Status: DC
  Administered 2011-03-15 – 2011-03-18 (×8): 5 [IU] via SUBCUTANEOUS
  Filled 2011-03-15: qty 3

## 2011-03-15 MED ORDER — DIPHENHYDRAMINE HCL 25 MG PO TABS
25.0000 mg | ORAL_TABLET | Freq: Every evening | ORAL | Status: DC | PRN
  Filled 2011-03-15: qty 1

## 2011-03-15 MED ORDER — HYDROCODONE-ACETAMINOPHEN 5-325 MG PO TABS
1.0000 | ORAL_TABLET | Freq: Four times a day (QID) | ORAL | Status: DC | PRN
  Administered 2011-03-15 – 2011-03-17 (×4): 1 via ORAL
  Filled 2011-03-15 (×4): qty 1

## 2011-03-15 MED ORDER — FLUTICASONE PROPIONATE 50 MCG/ACT NA SUSP
2.0000 | Freq: Every day | NASAL | Status: DC | PRN
  Filled 2011-03-15: qty 16

## 2011-03-15 MED ORDER — INSULIN REGULAR HUMAN 100 UNIT/ML IJ SOLN: 10.0000 [IU] | Freq: Once | INTRAMUSCULAR | Status: DC

## 2011-03-15 MED ORDER — ASPIRIN 81 MG PO TABS: 81.0000 mg | ORAL_TABLET | Freq: Every evening | ORAL | Status: DC

## 2011-03-15 MED ORDER — DIPHENHYDRAMINE HCL 50 MG/ML IJ SOLN
25.0000 mg | Freq: Once | INTRAMUSCULAR | Status: AC
  Administered 2011-03-15: 50 mg via INTRAVENOUS
  Filled 2011-03-15: qty 1

## 2011-03-15 MED ORDER — SODIUM CHLORIDE 0.9 % IV BOLUS (SEPSIS)
1000.0000 mL | Freq: Once | INTRAVENOUS | Status: AC
  Administered 2011-03-15: 1000 mL via INTRAVENOUS

## 2011-03-15 MED ORDER — ACETAMINOPHEN 650 MG RE SUPP: 650.0000 mg | Freq: Four times a day (QID) | RECTAL | Status: DC | PRN

## 2011-03-15 MED ORDER — AMITRIPTYLINE HCL 100 MG PO TABS
100.0000 mg | ORAL_TABLET | Freq: Every day | ORAL | Status: DC
  Filled 2011-03-15: qty 1

## 2011-03-15 MED ORDER — INSULIN ASPART 100 UNIT/ML ~~LOC~~ SOLN
10.0000 [IU] | Freq: Once | SUBCUTANEOUS | Status: DC
  Filled 2011-03-15: qty 1

## 2011-03-15 MED ORDER — ACETAMINOPHEN 325 MG PO TABS: 650.0000 mg | ORAL_TABLET | Freq: Four times a day (QID) | ORAL | Status: DC | PRN

## 2011-03-15 MED ORDER — INSULIN ASPART 100 UNIT/ML ~~LOC~~ SOLN
0.0000 [IU] | Freq: Three times a day (TID) | SUBCUTANEOUS | Status: DC
  Administered 2011-03-15: 11 [IU] via SUBCUTANEOUS
  Administered 2011-03-16: 2 [IU] via SUBCUTANEOUS
  Administered 2011-03-16 – 2011-03-17 (×3): 5 [IU] via SUBCUTANEOUS
  Administered 2011-03-17: 8 [IU] via SUBCUTANEOUS
  Administered 2011-03-17: 15 [IU] via SUBCUTANEOUS
  Administered 2011-03-18: 3 [IU] via SUBCUTANEOUS

## 2011-03-15 NOTE — ED Notes (Signed)
Called 3000 spoke to [place] Minor re: admin of 10 units insulin IV

## 2011-03-15 NOTE — Progress Notes (Signed)
Utilization Review Completed.[name] T1/29/2013   

## 2011-03-15 NOTE — ED Notes (Signed)
States off and on limb numbness and difficulty walking has been getting progressivily worse has diabetic neuropathy has seen dr Lovell Sheehan in the last couple of months and given new rx but has not gotten  It filled,

## 2011-03-15 NOTE — Progress Notes (Signed)
Pt admitted to unit, VSS, oriented to unit and equipment. CBG checked at 1355 and was 134. Awaiting MD Cleotis Lema for further floor orders.  Will cont to monitor.  Minor, Yvette Rack

## 2011-03-15 NOTE — H&P (Addendum)
PCP:  Ron Parker, MD, MD   DOA:  03/15/2011  8:57 AM  Chief Complaint:  Generalized weakness  HPI: This is a 56 years old Caucasian man with history of diabetes mellitus complicated with diabetic neuropathy, presented to the ER today with chief complaint of generalized weakness associated with numbness in his feet and hands which he stated his chronic for years, he was being treated with amitriptyline,  he was given a prescription for amitriptyline by his PCP recently however he was unable to refill it yet. He stated that he had a couple of falls recently because of generalized weakness, he denies any preceding symptoms such as palpitations, chest pain, nausea or dizziness. He  is also complaining of headache,started 2 days ago, similar to previous headaches which he described as cluster with headache. It frontal, currently 3/10, denies any associated visual symptoms.In the ED labs showed sodium level of 122, however his glucose level was 353. We were asked to admit for further management. Allergies: No Known Allergies  Prior to Admission medications   Medication Sig Start Date End Date Taking? Authorizing Provider  amitriptyline (ELAVIL) 100 MG tablet Take 100 mg by mouth at bedtime.     Yes Historical Provider, MD  aspirin 81 MG tablet Take 81 mg by mouth every evening.     Yes Historical Provider, MD  diphenhydrAMINE (SOMINEX) 25 MG tablet Take 25 mg by mouth at bedtime as needed. For sleep   Yes Historical Provider, MD  FLUoxetine (PROZAC) 20 MG capsule Take 60 mg by mouth daily.   Yes Historical Provider, MD  fluticasone (FLONASE) 50 MCG/ACT nasal spray Place 2 sprays into the nose daily as needed. For allergies   Yes Historical Provider, MD  HYDROcodone-acetaminophen (NORCO) 5-325 MG per tablet Take 1 tablet by mouth every 6 (six) hours as needed. For pain   Yes Historical Provider, MD  insulin aspart protamine-insulin aspart (NOVOLOG 70/30) (70-30) 100 UNIT/ML injection Inject 5-15  Units into the skin 3 (three) times daily. Per sliding scale   Yes Historical Provider, MD  insulin glargine (LANTUS) 100 UNIT/ML injection Inject 1-8 Units into the skin daily. Takes before heaviest meal of the day   Yes Historical Provider, MD  omeprazole (PRILOSEC) 40 MG capsule Take 40 mg by mouth daily.   Yes Historical Provider, MD    Past Medical History  Diagnosis Date  . Osteomyelitis of toe of left foot 10/24/10    fifth metatarsal base  . Diabetic toe ulcer 10/24/10    Deep abscess   . Diabetes mellitus   . GERD (gastroesophageal reflux disease)   . Obstructive sleep apnea   . PTSD (post-traumatic stress disorder)   . Depression   . Anxiety   . Schizophrenia   . History of migraine headaches   . Glaucoma     Past Surgical History  Procedure Date  . Incise and drain abcess 10/20/10    left foot  . Bone resection 10/19/10    resection of the fifth metatarsal base w/ VAC application  . Foot tendon transfer 10/19/10    left ankle peroneus brevis to peroneus longus     Social History:  He lives with his mother, denies smoking, drinking  alcohol or use illicit drugs.  History reviewed. No pertinent family history.  Review of Systems:  As above in history of present illness Constitutional: Denies fever, chills, diaphoresis, appetite change and fatigue.  HEENT: Denies photophobia, eye pain, redness, hearing loss, ear pain, congestion, sore throat, rhinorrhea,  sneezing, mouth sores, trouble swallowing, neck pain, neck stiffness and tinnitus.   Respiratory: Denies SOB, DOE, cough, chest tightness,  and wheezing.   Cardiovascular: Denies chest pain, palpitations and leg swelling.  Gastrointestinal: Denies nausea, vomiting, abdominal pain, diarrhea, constipation, blood in stool and abdominal distention.  Genitourinary: Denies dysuria, urgency, frequency, hematuria, flank pain and difficulty urinating.  Neurological: Denies dizziness, seizures, syncope,  light-headedness, numbness  and headaches.     Physical Exam:  Filed Vitals:   03/15/11 0907 03/15/11 1148 03/15/11 1158  BP: 140/81 131/84 118/78  Pulse: 89 71 74  Temp: 97.5 F (36.4 C) 97.8 F (36.6 C)   TempSrc: Oral Oral   Resp: 18 16   SpO2: 100% 100% 97%    Constitutional: Vital signs reviewed.  Patient is  in no acute distress and cooperative with exam. Alert and oriented x3.  Head: Normocephalic and atraumatic Ear: TM normal bilaterally Neck: Supple, Trachea midline normal ROM, No JVD, mass, thyromegaly, or carotid bruit present.  Cardiovascular: RRR, S1 normal, S2 normal, no MRG, pulses symmetric and intact bilaterally Pulmonary/Chest: CTAB, no wheezes, rales, or rhonchi Abdominal: Soft. Non-tender, non-distended, bowel sounds are normal, no masses, organomegaly, or guarding present.  Ext: left foot lateral about 2 cm  indurated area with no drainage ,warmth or erythema .skin abrasion lateral to left third toenail no drainage or erythema noted . No edema and no cyanosis, pulses palpable bilaterally Neurological: A&O x3, Strenght is normal and symmetric bilaterally, cranial nerve II-XII are grossly intact, no focal motor deficit, diminished sensory  to light touch  in feet up the lower leg bilaterally.    Labs on Admission:  Results for orders placed during the hospital encounter of 03/15/11 (from the past 48 hour(s))  CBC     Status: Abnormal   Collection Time   03/15/11  9:43 AM      Component Value Range Comment   WBC 4.5  4.0 - 10.5 (K/uL)    RBC 4.47  4.22 - 5.81 (MIL/uL)    Hemoglobin 12.7 (*) 13.0 - 17.0 (g/dL)    HCT 62.1 (*) 30.8 - 52.0 (%)    MCV 78.5  78.0 - 100.0 (fL)    MCH 28.4  26.0 - 34.0 (pg)    MCHC 36.2 (*) 30.0 - 36.0 (g/dL)    RDW 65.7  84.6 - 96.2 (%)    Platelets 328  150 - 400 (K/uL)   BASIC METABOLIC PANEL     Status: Abnormal   Collection Time   03/15/11  9:43 AM      Component Value Range Comment   Sodium 122 (*) 135 - 145 (mEq/L)    Potassium 4.5  3.5 - 5.1  (mEq/L)    Chloride 86 (*) 96 - 112 (mEq/L)    CO2 27  19 - 32 (mEq/L)    Glucose, Bld 358 (*) 70 - 99 (mg/dL)    BUN 11  6 - 23 (mg/dL)    Creatinine, Ser 9.52  0.50 - 1.35 (mg/dL)    Calcium 9.3  8.4 - 10.5 (mg/dL)    GFR calc non Af Amer >90  >90 (mL/min)    GFR calc Af Amer >90  >90 (mL/min)   GLUCOSE, CAPILLARY     Status: Abnormal   Collection Time   03/15/11  1:52 PM      Component Value Range Comment   Glucose-Capillary 134 (*) 70 - 99 (mg/dL)    Comment 1 Notify RN  Radiological Exams on Admission: No results found.  Assessment/Plan Principal Problem:  *Hyponatremia Chronic, corrected sodium to glucose level is about 127. 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 Patient reported that he has been drinking up to 8 cups of 8 ounces of water per day. We'll send the urine sodium, urine osmolality, add on serum osmolality, check TSH and cortisol level. Will restrict fluid intake for now pending results. Patient is on Prozac and amitriptyline, both can cause SIADH. Active Problems:  Diabetes mellitus with neuropathy Uncontrolled, patient was taking insulin 70/30 and Lantus insulin on scale basis, that was confirmed with the patient. I will increase Lantus insulin to 12 units to take each bedtime. Hold insulin 70/30 for now, continue with NovoLog meal coverage 5 units 3 times a day a.c., and moderate insulin scale. Check hemoglobin A1c, adjust insulin dose as needed. I will consult diabetic educator educated the patient on insulin administration, diet. Her neuropathy, I will start  gabapentin and discontinue amitriptyline given low sodium.  Weakness generalized Multifactorial, probably secondary to combination of hyponatremia and diabetic neuropathy. PT consult Headache  As per patient chronic but worse than previous episodes, will check head CT.  Chronic osteomyelitis of the  left foot He completed course of antibiotics, followed by Dr. Algis Liming as an outpatient. DVT prophylaxis Lovenox   Time Spent on Admission: Approximately 50 minutes  Maude Hettich 03/15/2011, 3:01 PM

## 2011-03-15 NOTE — ED Notes (Signed)
Pt states that he is here for increasing weakness states that he was in hospital  Last year for iv antiobiotics and then he went to assisted living for same. States has trouble walking anyway uses a walker. Is  Thinking that because of his neuropathy he will have to give up his car. Pt aaox4 and is able to stand get to bed and remove his clothes. Also states that he has been looking after his mother

## 2011-03-15 NOTE — ED Provider Notes (Signed)
History     CSN: [id number]  Arrival date & time 03/15/11  4098   First MD Initiated Contact with Patient 03/15/11 [id number]      Chief Complaint  Patient presents with  . Weakness    (Consider location/radiation/quality/duration/timing/severity/associated sxs/prior treatment) Patient is a 56 y.o. male presenting with weakness and headaches. The history is provided by the patient.  Weakness The primary symptoms include headaches, paresthesias and nausea. Primary symptoms do not include fever or vomiting. Episode onset: 17 years ago, worsening. Episode duration: years. The symptoms are worsening. The neurological symptoms are diffuse. Context: "I have diabetic neuropathy"  The headache is associated with paresthesias and weakness.  Additional symptoms include weakness. Medical issues also include diabetes.  Headache  This is a new problem. The current episode started 2 days ago. The problem occurs constantly. The problem has been gradually worsening. The headache is associated with bright light. Pain location: diffuse. The quality of the pain is described as dull. The pain is at a severity of 6/10. Associated symptoms include nausea. Pertinent negatives include no fever, no shortness of breath and no vomiting. Treatments tried: excedrin. The treatment provided no relief.    Past Medical History  Diagnosis Date  . Osteomyelitis of toe of left foot 10/24/10    fifth metatarsal base  . Diabetic toe ulcer 10/24/10    Deep abscess   . Diabetes mellitus   . GERD (gastroesophageal reflux disease)   . Obstructive sleep apnea   . PTSD (post-traumatic stress disorder)   . Depression   . Anxiety   . Schizophrenia   . History of migraine headaches   . Glaucoma     Past Surgical History  Procedure Date  . Incise and drain abcess 10/20/10    left foot  . Bone resection 10/19/10    resection of the fifth metatarsal base w/ VAC application  . Foot tendon transfer 10/19/10    left ankle peroneus  brevis to peroneus longus     No family history on file.  History  Substance Use Topics  . Smoking status: Never Smoker   . Smokeless tobacco: Not on file  . Alcohol Use: No      Review of Systems  Constitutional: Negative for fever.  HENT: Negative for congestion, facial swelling and trouble swallowing.   Respiratory: Negative for cough and shortness of breath.   Cardiovascular: Negative for chest pain.  Gastrointestinal: Positive for nausea. Negative for vomiting, abdominal pain and diarrhea.  Genitourinary: Negative for difficulty urinating.  Skin: Negative for rash.  Neurological: Positive for weakness, headaches and paresthesias.  All other systems reviewed and are negative.    Allergies  Review of patient's allergies indicates no known allergies.  Home Medications   Current Outpatient Rx  Name Route Sig Dispense Refill  . AMITRIPTYLINE HCL 100 MG PO TABS Oral Take 100 mg by mouth at bedtime.      . ASPIRIN 81 MG PO TABS Oral Take 81 mg by mouth every evening.      [name] DIPHENHYDRAMINE HCL (SLEEP) 25 MG PO TABS Oral Take 25 mg by mouth at bedtime.      [name] ESOMEPRAZOLE MAGNESIUM 40 MG PO CPDR Oral Take 40 mg by mouth every morning.      [name] FLUOXETINE HCL 40 MG PO CAPS Oral Take 40 mg by mouth every evening.      [name] FLUTICASONE PROPIONATE 50 MCG/ACT NA SUSP Nasal Place 2 sprays into the nose 2 (two) times daily.      [name]  HYDROCODONE-ACETAMINOPHEN 5-325 MG PO TABS Oral Take 1 tablet by mouth every 6 (six) hours as needed.      [name] NOVOLOG Keys Subcutaneous Inject into the skin 3 (three) times daily. Per sliding scale protocol     . INSULIN GLARGINE 100 UNIT/ML [place] SOLN Subcutaneous Inject 8 Units into the skin at bedtime.        BP 140/81  Pulse 89  Temp(Src) 97.5 F (36.4 C) (Oral)  Resp 18  SpO2 100%  Physical Exam  Nursing note and vitals reviewed. Constitutional: He is oriented to person, place, and time. He appears well-developed and well-nourished. No distress.    HENT:  Head: Normocephalic and atraumatic.  Mouth/Throat: Oropharynx is clear and moist.  Eyes: Conjunctivae are normal. Pupils are equal, round, and reactive to light. No scleral icterus.  Neck: Normal range of motion. Neck supple.  Cardiovascular: Normal rate, regular rhythm, normal heart sounds and intact distal pulses.   No murmur heard. Pulmonary/Chest: Effort normal and breath sounds normal. No stridor. No respiratory distress. He has no wheezes. He has no rales.  Abdominal: Soft. He exhibits no distension. There is no tenderness.  Musculoskeletal: Normal range of motion. He exhibits no edema.  Neurological: He is alert and oriented to person, place, and time. A sensory deficit (decreased soft touch sensation in bilateral feet and hands) is present. No cranial nerve deficit. Gait normal.       Decreased strength in bilateral dorsi and plantar flexion of lower extremities.  Weakness is distractable.  He is able to ambulate with his walker without difficulty.    Weakness in bilateral upper extremities with arm flexion and extension.  This is also distractable.    Skin: Skin is warm and dry. No rash noted.  Psychiatric: He has a normal mood and affect. His behavior is normal.    ED Course  Procedures (including critical care time)  Labs Reviewed  CBC - Abnormal; Notable for the following:    Hemoglobin 12.7 (*)    HCT 35.1 (*)    MCHC 36.2 (*)    All other components within normal limits  BASIC METABOLIC PANEL - Abnormal; Notable for the following:    Sodium 122 (*)    Chloride 86 (*)    Glucose, Bld 358 (*)    All other components within normal limits  GLUCOSE, CAPILLARY - Abnormal; Notable for the following:    Glucose-Capillary 134 (*)    All other components within normal limits  CBC - Abnormal; Notable for the following:    Hemoglobin 12.9 (*)    HCT 36.1 (*)    All other components within normal limits  GLUCOSE, CAPILLARY - Abnormal; Notable for the following:     Glucose-Capillary 340 (*)    All other components within normal limits  CREATININE, SERUM  SODIUM, URINE, RANDOM  OSMOLALITY, URINE  OSMOLALITY  HEMOGLOBIN A1C  BASIC METABOLIC PANEL  CBC  TSH  CORTISOL   No results found.   1. Hyperglycemia   2. Hyponatremia       MDM  56 yo male with hx of diabetes presenting with headache and progressive weakness/numbness in upper and lower extremities.  Headache for past 2 days consistent with prior headaches.  Weakness and numbness have progressed over the past 17 years and he atributes this to diabetic neuropathy.  He recently saw his PCP who he says prescribed gabapentin which he has not gotten filled.  Has bilateral upper and lower extremity weakness on direct testing, but  noted to have good strength and coordination when standing and disrobing.  Will treat headache with migraine cocktail minus dexamethasone.  Do not think he needs neuro imaging based on history and exam.  Will check labwork as he states he has not had good glycemic control recently.    Labwork shows hyperglycemia and hyponatremia (corrected to 125).  Discussed with [facility] hospitalist who has admitted for further management.        Warnell Forester, MD 03/15/11 [phone]

## 2011-03-16 ENCOUNTER — Ambulatory Visit: Payer: Medicaid Other | Admitting: Infectious Disease

## 2011-03-16 LAB — CBC
MCH: 28.3 pg (ref 26.0–34.0)
MCV: 79.3 fL (ref 78.0–100.0)
Platelets: 329 10*3/uL (ref 150–400)
RDW: 13.4 % (ref 11.5–15.5)
WBC: 5.6 10*3/uL (ref 4.0–10.5)

## 2011-03-16 LAB — BASIC METABOLIC PANEL
CO2: 25 mEq/L (ref 19–32)
Calcium: 9.3 mg/dL (ref 8.4–10.5)
Creatinine, Ser: 0.58 mg/dL (ref 0.50–1.35)
GFR calc non Af Amer: >90 mL/min (ref >90)

## 2011-03-16 LAB — HEMOGLOBIN A1C
Hgb A1c MFr Bld: 12.2 % — ABNORMAL HIGH (ref <5.7)
Mean Plasma Glucose: 303 mg/dL — ABNORMAL HIGH (ref <117)

## 2011-03-16 MED ORDER — INSULIN GLARGINE 100 UNIT/ML ~~LOC~~ SOLN
18.0000 [IU] | Freq: Every day | SUBCUTANEOUS | Status: DC
  Administered 2011-03-16: 18 [IU] via SUBCUTANEOUS

## 2011-03-16 MED ORDER — INFLUENZA VIRUS VACC SPLIT PF IM SUSP
0.5000 mL | INTRAMUSCULAR | Status: AC
  Administered 2011-03-16: 0.5 mL via INTRAMUSCULAR
  Filled 2011-03-16: qty 0.5

## 2011-03-16 NOTE — Evaluation (Signed)
Physical Therapy Evaluation Patient Details Name: [name] MRN: [id number] DOB: 10/15/1955 Today's Date: 03/16/2011  Problem List:  Patient Active Problem List  Diagnoses  . Osteomyelitis of toe of left foot  . Diabetic toe ulcer  . Foot abscess, left  . MSSA (methicillin susceptible Staphylococcus aureus) infection  . Hypotension  . Diabetes mellitus with neuropathy  . Hyponatremia  . Weakness generalized  . Headache    Past Medical History:  Past Medical History  Diagnosis Date  . Osteomyelitis of toe of left foot 10/24/10    fifth metatarsal base  . Diabetic toe ulcer 10/24/10    Deep abscess   . Diabetes mellitus   . GERD (gastroesophageal reflux disease)   . Obstructive sleep apnea   . PTSD (post-traumatic stress disorder)   . Depression   . Anxiety   . Schizophrenia   . History of migraine headaches   . Glaucoma    Past Surgical History:  Past Surgical History  Procedure Date  . Incise and drain abcess 10/20/10    left foot  . Bone resection 10/19/10    resection of the fifth metatarsal base w/ VAC application  . Foot tendon transfer 10/19/10    left ankle peroneus brevis to peroneus longus     PT Assessment/Plan/Recommendation PT Assessment Clinical Impression Statement: Pt adm with hyponatremia.  Pt reports frequent falls at home.  Feel pt is close to baseline.  Recommend HHPT. PT Recommendation/Assessment: Patient will need skilled PT in the acute care venue PT Problem List: Decreased strength;Decreased activity tolerance;Decreased balance;Decreased mobility PT Therapy Diagnosis : Difficulty walking;Generalized weakness PT Plan PT Frequency: Min 3X/week PT Treatment/Interventions: Gait training;DME instruction;Functional mobility training;Therapeutic activities;Therapeutic exercise;Balance training PT Recommendation Follow Up Recommendations: Home health PT Equipment Recommended: None recommended by PT PT Goals  Acute Rehab PT Goals PT Goal  Formulation: With patient Pt will go Sit to Stand: with modified independence PT Goal: Sit to Stand - Progress: Goal set today Pt will go Stand to Sit: with modified independence PT Goal: Stand to Sit - Progress: Goal set today Pt will Ambulate: 51 - 150 feet;with modified independence PT Goal: Ambulate - Progress: Goal set today  PT Evaluation Precautions/Restrictions  Precautions Precautions: Fall Required Braces or Orthoses: Yes Other Brace/Splint: CAM boot Lt foot Restrictions Weight Bearing Restrictions: No Prior Functioning  Home Living Lives With: Other (Comment) (mother) Type of Home: Apartment Home Layout: One level Home Access: Level entry Bathroom Shower/Tub: Engineer, manufacturing systems:  (toilet riser) Home Adaptive Equipment: Walker - rolling;Tub transfer bench Prior Function Level of Independence: Independent with transfers;Requires assistive device for independence;Independent with gait;Independent with basic ADLs Driving: Yes Vocation: On disability Cognition Cognition Arousal/Alertness: Awake/alert Overall Cognitive Status: Appears within functional limits for tasks assessed Orientation Level: Oriented X4 Sensation/Coordination Sensation Additional Comments: Pt reports numbness in feet. Extremity Assessment RLE Strength RLE Overall Strength Comments: grossly 4/5 LLE Strength LLE Overall Strength Comments: grossly 4/5 except lt ankle 0/5 Mobility (including Balance) Bed Mobility Bed Mobility: Yes Supine to Sit: 7: Independent Transfers Sit to Stand: 6: Modified independent (Device/Increase time);From bed;With upper extremity assist Stand to Sit: 6: Modified independent (Device/Increase time);With upper extremity assist;To chair/3-in-1;With armrests Ambulation/Gait Ambulation/Gait Assistance: 5: Supervision Ambulation Distance (Feet): 250 Feet Assistive device: Rolling walker Gait Pattern: Decreased dorsiflexion - left;Left genu recurvatum (Pt  with lt leg internally rotated.)  Static Standing Balance Static Standing - Balance Support: No upper extremity supported Static Standing - Level of Assistance: 6: Modified independent (Device/Increase time)  Exercise    End of Session PT - End of Session Equipment Utilized During Treatment: Gait belt Activity Tolerance: Patient tolerated treatment well Patient left: in chair;with call bell in reach (OT with patient)  [name] 03/16/2011, 3:03 PM  [facility] PT [phone]

## 2011-03-16 NOTE — Progress Notes (Signed)
PCP: Ron Parker, MD, MD  Brief HPI:  This is a 56 years old Caucasian man with history of diabetes mellitus complicated with diabetic neuropathy, presented to the ER with chief complaint of generalized weakness associated with numbness in his feet and hands which he stated his chronic for years, he was being treated with amitriptyline, he was given a prescription for amitriptyline by his PCP recently however he was unable to refill it yet. He stated that he had a couple of falls recently because of generalized weakness, he denies any preceding symptoms such as palpitations, chest pain, nausea or dizziness. He also complained of headache, started 2 days ago, similar to previous headaches which he described as cluster with headache.  Consultants: None so far  Procedures: None so far  Subjective: Patient still with weakness and numbness in feet. Also mentions weight loss of about 50lbs in the last 2-3 months for which he is being seen by his PCP.  Objective: Vital signs in last 24 hours: Temp:  [97.3 F (36.3 C)-98.4 F (36.9 C)] 97.3 F (36.3 C) (01/30 0524) Pulse Rate:  [73-97] 97  (01/30 0524) Resp:  [17-18] 18  (01/30 0524) BP: (107-147)/(73-89) 107/73 mmHg (01/30 0524) SpO2:  [98 %-100 %] 99 % (01/30 0524) Weight:  [54.432 kg (120 lb)] 54.432 kg (120 lb) (01/29 2009) Weight change:  Last BM Date: 03/15/11  Intake/Output from previous day: 01/29 0701 - 01/30 0700 In: 300 [P.O.:300] Out: 950 [Urine:950] Intake/Output this shift:    General appearance: alert, cooperative, appears stated age and no distress Head: Normocephalic, without obvious abnormality, atraumatic Eyes: conjunctivae/corneas clear. PERRL, EOM's intact.  Throat: lips, mucosa, and tongue normal; teeth and gums normal Neck: no adenopathy, no carotid bruit, no JVD, supple, symmetrical, trachea midline and thyroid not enlarged, symmetric, no tenderness/mass/nodules Resp: clear to auscultation  bilaterally Cardio: regular rate and rhythm, S1, S2 normal, no murmur, click, rub or gallop GI: soft, non-tender; bowel sounds normal; no masses,  no organomegaly Extremities: extremities normal, atraumatic, no cyanosis or edema Pulses: 2+ and symmetric Skin: Skin color, texture, turgor normal. No rashes or lesions Lymph nodes: Cervical, supraclavicular, and axillary nodes normal. Neurologic: Alert and oriented X 3, normal strength and tone. Normal symmetric reflexes. Normal coordination and gait  Lab Results:  Basename 03/16/11 0510 03/15/11 1610  WBC 5.6 5.2  HGB 13.4 12.9*  HCT 37.6* 36.1*  PLT 329 333   BMET  Basename 03/16/11 0510 03/15/11 1610 03/15/11 0943  NA 127* -- 122*  K 4.0 -- 4.5  CL 93* -- 86*  CO2 25 -- 27  GLUCOSE 191* -- 358*  BUN 11 -- 11  CREATININE 0.58 0.55 --  CALCIUM 9.3 -- 9.3    Studies/Results: Ct Head Wo Contrast  03/15/2011  *RADIOLOGY REPORT*  Clinical Data: Syncope, falls, hypertension, history seizures, traumatic brain injury at age 63  CT HEAD WITHOUT CONTRAST  Technique:  Contiguous axial images were obtained from the base of the skull through the vertex without contrast.  Comparison: 10/01/2010  Findings: Normal ventricular morphology. No midline shift or mass effect. Normal appearance of brain parenchyma. No intracranial hemorrhage, mass lesion, or acute infarction. Visualized paranasal sinuses and mastoid air cells clear. Bones unremarkable. Dense calcification anterior falx stable.  IMPRESSION: No acute intracranial abnormalities.  Original Report Authenticated By: Lollie Marrow, M.D.    Medications:  Scheduled:   . aspirin EC  81 mg Oral QPM  . enoxaparin  40 mg Subcutaneous Q24H  . FLUoxetine  60 mg Oral Daily  . gabapentin  100 mg Oral TID  . influenza  inactive virus vaccine  0.5 mL Intramuscular Tomorrow-1000  . insulin aspart  0-15 Units Subcutaneous TID WC  . insulin aspart  0-5 Units Subcutaneous QHS  . insulin aspart  10  Units Intravenous Once  . insulin aspart  5 Units Subcutaneous TID WC  . insulin glargine  12 Units Subcutaneous QHS  . pantoprazole  80 mg Oral Q1200  . DISCONTD: amitriptyline  100 mg Oral QHS  . DISCONTD: aspirin  81 mg Oral QPM  . DISCONTD: insulin aspart  10 Units Subcutaneous Once  . DISCONTD: insulin regular  10 Units Subcutaneous Once  . DISCONTD: insulin regular  10 Units Subcutaneous Once    Assessment/Plan:  Principal Problem:  *Hyponatremia Active Problems:  Diabetes mellitus with neuropathy  Weakness generalized  Headache    1. Hyponatremia: Improved this morning. Sodium level was 129 in November 2012 but was normal prior to that. Per patient he has been drinking a lot of fluids ever since he has noted that he has been losing weight. Etiology of hyponatremia is probably polydipsia though SIADH is a possibilty. Continue restricting fluid intake for now. TSH and cortisol levels are normal.   2. Diabetes mellitus with neuropathy: Uncontrolled, patient was taking insulin 70/30 and Lantus insulin and novolog at home. Currently on lantus. Will adjust dose. Hemoglobin A1c is 12.2. For neuropathy gabapentin has been initiated. Check b12 level.   3. Weakness generalized: Multifactorial, probably secondary to combination of hyponatremia and diabetic neuropathy and weight loss. PT/OT consult.  4. Headache: CT unremarkable. Monitor.   5. Chronic osteomyelitis of the left foot: He completed course of antibiotics, followed by Dr. Algis Liming as an outpatient.   6. DVT prophylaxis: Lovenox   Disposition: Hopefully home in AM. Await PT/OT input.    LOS: 1 day   [facility] Pager [phone] 03/16/2011, 12:20 PM

## 2011-03-16 NOTE — Evaluation (Signed)
Occupational Therapy Evaluation Patient Details Name: [name] MRN: [id number] DOB: 24-Dec-1955 Today's Date: 03/16/2011  Problem List:  Patient Active Problem List  Diagnoses  . Osteomyelitis of toe of left foot  . Diabetic toe ulcer  . Foot abscess, left  . MSSA (methicillin susceptible Staphylococcus aureus) infection  . Hypotension  . Diabetes mellitus with neuropathy  . Hyponatremia  . Weakness generalized  . Headache    Past Medical History:  Past Medical History  Diagnosis Date  . Osteomyelitis of toe of left foot 10/24/10    fifth metatarsal base  . Diabetic toe ulcer 10/24/10    Deep abscess   . Diabetes mellitus   . GERD (gastroesophageal reflux disease)   . Obstructive sleep apnea   . PTSD (post-traumatic stress disorder)   . Depression   . Anxiety   . Schizophrenia   . History of migraine headaches   . Glaucoma    Past Surgical History:  Past Surgical History  Procedure Date  . Incise and drain abcess 10/20/10    left foot  . Bone resection 10/19/10    resection of the fifth metatarsal base w/ VAC application  . Foot tendon transfer 10/19/10    left ankle peroneus brevis to peroneus longus     OT Assessment/Plan/Recommendation OT Assessment Clinical Impression Statement: THis 56 y.o. male admitted with hyponatremia, presents to OT with decreased balance and history of falls.  Pt. however, reports he is currently at his baseline level of functioning.  PT is recommending HHPT to address balance deficits.  No further OT needs identified OT Recommendation/Assessment: Patient does not need any further OT services OT Recommendation Follow Up Recommendations: No OT follow up Equipment Recommended: None recommended by PT;None recommended by OT OT Goals    OT Evaluation Precautions/Restrictions  Precautions Precautions: Fall Precaution Comments: Pt reports up to 20 falls/year Required Braces or Orthoses: Yes Other Brace/Splint: CAM boot Lt  foot Restrictions Weight Bearing Restrictions: No Prior Functioning Home Living Lives With: Other (Comment) (mother) Receives Help From: Family Type of Home: Apartment Home Layout: One level Home Access: Level entry Bathroom Shower/Tub: Engineer, manufacturing systems: Standard Bathroom Accessibility: Yes How Accessible: Accessible via walker Home Adaptive Equipment: Walker - rolling;Tub transfer bench Prior Function Level of Independence: Independent with transfers;Requires assistive device for independence;Independent with gait;Independent with basic ADLs Able to Take Stairs?: Yes Driving: Yes Vocation: On disability ADL ADL Eating/Feeding: Simulated;Independent Where Assessed - Eating/Feeding: Chair Grooming: Simulated;Wash/dry hands;Wash/dry face;Teeth care;Brushing hair;Supervision/safety Where Assessed - Grooming: Standing at sink Upper Body Bathing: Simulated;Set up Where Assessed - Upper Body Bathing: Sitting, chair Lower Body Bathing: Simulated;Supervision/safety Where Assessed - Lower Body Bathing: Sit to stand from chair Upper Body Dressing: Simulated;Set up Where Assessed - Upper Body Dressing: Sitting, chair Lower Body Dressing: Simulated;Supervision/safety Where Assessed - Lower Body Dressing: Sit to stand from chair Toilet Transfer: Simulated;Supervision/safety Toilet Transfer Method: Proofreader: Comfort height toilet Toileting - Clothing Manipulation: Simulated;Supervision/safety Where Assessed - Toileting Clothing Manipulation: Standing Toileting - Hygiene: Simulated;Independent Where Assessed - Toileting Hygiene: Sit on 3-in-1 or toilet Tub/Shower Transfer: Simulated;Supervision/safety Tub/Shower Transfer Method: Ambulating (tub transfer bench) Tub/Shower Transfer Equipment: Counsellor Used: Rolling walker ADL Comments: Pt. reports he is at his baseline and has all DME Vision/Perception     Cognition Cognition Arousal/Alertness: Awake/alert Overall Cognitive Status: Appears within functional limits for tasks assessed Orientation Level: Oriented X4 Sensation/Coordination Sensation Additional Comments: Pt reports numbness in feet. Coordination Gross Motor Movements are Fluid  and Coordinated: Yes Fine Motor Movements are Fluid and Coordinated: Yes Extremity Assessment RUE Assessment RUE Assessment: Within Functional Limits LUE Assessment LUE Assessment: Within Functional Limits Mobility  Bed Mobility Bed Mobility: Yes Supine to Sit: 7: Independent Transfers Transfers: Yes Sit to Stand: 6: Modified independent (Device/Increase time);From bed;With upper extremity assist Stand to Sit: 6: Modified independent (Device/Increase time);With upper extremity assist;To chair/3-in-1;With armrests Exercises   End of Session OT - End of Session Activity Tolerance: Patient tolerated treatment well Patient left: in chair;with call bell in reach General Behavior During Session: [facility] for tasks performed Cognition: [facility] for tasks performed   Angellynn Kimberlin M 03/16/2011, 3:42 PM

## 2011-03-16 NOTE — Progress Notes (Signed)
Inpatient Diabetes Program Recommendations  AACE/ADA: New Consensus Statement on Inpatient Glycemic Control (2009)  Target Ranges:  Prepandial:   less than 140 mg/dL      Peak postprandial:   less than 180 mg/dL (1-2 hours)      Critically ill patients:  140 - 180 mg/dL   Reason for Visit: Hyperglycemia  Inpatient Diabetes Program Recommendations Insulin - Basal: Pt at 54.4 kg.  Would need at least 20 units basal at this time. Glucose this am was 235mg /dL.   Please consider increase to 20 units Lantus  Note: Thank you, Lenor Coffin, RN, CNS, Diabetes Coordinator [phone])

## 2011-03-17 LAB — CBC
HCT: 35.7 % — ABNORMAL LOW (ref 39.0–52.0)
MCV: 78.5 fL (ref 78.0–100.0)
RBC: 4.55 MIL/uL (ref 4.22–5.81)
WBC: 5.3 10*3/uL (ref 4.0–10.5)

## 2011-03-17 LAB — GLUCOSE, CAPILLARY: Glucose-Capillary: 218 mg/dL — ABNORMAL HIGH (ref 70–99)

## 2011-03-17 LAB — BASIC METABOLIC PANEL
BUN: 11 mg/dL (ref 6–23)
CO2: 24 mEq/L (ref 19–32)
Chloride: 89 mEq/L — ABNORMAL LOW (ref 96–112)
Creatinine, Ser: 0.59 mg/dL (ref 0.50–1.35)

## 2011-03-17 MED ORDER — INSULIN GLARGINE 100 UNIT/ML ~~LOC~~ SOLN
22.0000 [IU] | Freq: Every day | SUBCUTANEOUS | Status: DC
  Administered 2011-03-17: 22 [IU] via SUBCUTANEOUS
  Filled 2011-03-17: qty 3

## 2011-03-17 MED ORDER — HYDROMORPHONE HCL PF 1 MG/ML IJ SOLN
0.5000 mg | Freq: Once | INTRAMUSCULAR | Status: AC
  Administered 2011-03-17: 0.5 mg via INTRAVENOUS
  Filled 2011-03-17: qty 1

## 2011-03-17 NOTE — Progress Notes (Signed)
   CARE MANAGEMENT NOTE 03/17/2011  Patient:  [name], [name]   Account Number:  [id number]  Date Initiated:  03/16/2011  Documentation initiated by:  MAYO,HENRIETTA  Subjective/Objective Assessment:   56 yr-old male adm with uncontrolled DM, hyponatremia; lives with mother, has walker, tub seat,  elevated toilet seat, and glucometer.  Previously received home health services through [facility].     Action/Plan:   Anticipated DC Date:  03/17/2011   Anticipated DC Plan:  HOME W HOME HEALTH SERVICES      DC Planning Services  CM consult      [facility] Choice  HOME HEALTH   Choice offered to / List presented to:  C-1 Patient        HH arranged  HH-1 RN  HH-10 DISEASE MANAGEMENT  HH-2 PT      HH agency  [facility]   Status of service:   Medicare Important Message given?   (If response is "NO", the following Medicare IM given date fields will be blank) Date Medicare IM given:   Date Additional Medicare IM given:    Discharge Disposition:  HOME W HOME HEALTH SERVICES  Per UR Regulation:  Reviewed for med. necessity/level of care/duration of stay  Comments:  PCP:  Dr. Della Goo  Contact:  Caven Perine, mother  [phone] 1/31 have alerted mary w [facility] that pt for disch today. Suella Grove [phone] 03/16/11 1450 Henrietta Mayo RN MSN CCM Per PT, pt will need home health PT services, will also need RN to assist with DM mgmt.  Pt states he has an old glucometer but can't afford the strips.  Provided information re ReliOn monitor which uses less expensive strips.  Provided list of [facility] agencies, referral made per pt choice.

## 2011-03-17 NOTE — Progress Notes (Signed)
Pt. Mentioned that he needs prescription for novolog upon discharge. Thanks.

## 2011-03-17 NOTE — ED Provider Notes (Signed)
History/physical exam/procedure(s) were performed by non-physician practitioner and as supervising physician I was immediately available for consultation/collaboration. I have reviewed all notes and am in agreement with care and plan.   Hilario Quarry, MD 03/17/11 1150

## 2011-03-17 NOTE — Progress Notes (Signed)
Results for ROSHAUN, POUND (MRN [id number]) as of 03/17/2011 15:53  Ref. Range 03/16/2011 16:48 03/16/2011 21:12 03/17/2011 06:20 03/17/2011 06:52 03/17/2011 12:28  Glucose-Capillary Latest Range: 70-99 mg/dL 409 (H) 811 (H)  914 (H) 273 (H)  Glucose Latest Range: 70-99 mg/dL   782 (H)      Recommend increasing Novolog meal coverage to 8 units tidwc.    Will follow.  Helyn App, RD, LDN, CDE Inpatient Diabetes Coordinator

## 2011-03-17 NOTE — Progress Notes (Signed)
PCP: Ron Parker, MD, MD  Brief HPI:  This is a 56 years old Caucasian man with history of diabetes mellitus complicated with diabetic neuropathy, presented to the ER with chief complaint of generalized weakness associated with numbness in his feet and hands which he stated his chronic for years, he was being treated with amitriptyline, he was given a prescription for amitriptyline by his PCP recently however he was unable to refill it yet. He stated that he had a couple of falls recently because of generalized weakness, he denies any preceding symptoms such as palpitations, chest pain, nausea or dizziness. He also complained of headache, started 2 days ago, similar to previous headaches which he described as cluster with headache.  Consultants: None so far  Procedures: None so far  Subjective: Patient still with weakness and numbness in feet. Also mentions weight loss of about 50lbs in the last 2-3 months for which he is being seen by his PCP. Had a migraine headache earlier today which is now better.  Objective: Vital signs in last 24 hours: Temp:  [97.8 F (36.6 C)-98.1 F (36.7 C)] 98.1 F (36.7 C) (01/31 1358) Pulse Rate:  [83-88] 88  (01/31 1358) Resp:  [16-20] 20  (01/31 1358) BP: (122-133)/(74-91) 122/74 mmHg (01/31 1358) SpO2:  [96 %-99 %] 99 % (01/31 1358) Weight change:  Last BM Date: 03/15/11  Intake/Output from previous day: 01/30 0701 - 01/31 0700 In: 480 [P.O.:480] Out: 2870 [Urine:2870] Intake/Output this shift: Total I/O In: 600 [P.O.:600] Out: 600 [Urine:600]  General appearance: alert, cooperative, appears stated age and no distress Head: Normocephalic, without obvious abnormality, atraumatic Eyes: conjunctivae/corneas clear. PERRL, EOM's intact.  Throat: lips, mucosa, and tongue normal; teeth and gums normal Neck: no adenopathy, no carotid bruit, no JVD, supple, symmetrical, trachea midline and thyroid not enlarged, symmetric, no  tenderness/mass/nodules Resp: clear to auscultation bilaterally Cardio: regular rate and rhythm, S1, S2 normal, no murmur, click, rub or gallop GI: soft, non-tender; bowel sounds normal; no masses,  no organomegaly Extremities: extremities normal, atraumatic, no cyanosis or edema Pulses: 2+ and symmetric Skin: Skin color, texture, turgor normal. No rashes or lesions Lymph nodes: Cervical, supraclavicular, and axillary nodes normal. Neurologic: Alert and oriented X 3, normal strength and tone. Normal symmetric reflexes. Normal coordination and gait  Lab Results:  Basename 03/17/11 0620 03/16/11 0510  WBC 5.3 5.6  HGB 13.0 13.4  HCT 35.7* 37.6*  PLT 325 329   BMET  Basename 03/17/11 0620 03/16/11 0510  NA 125* 127*  K 3.9 4.0  CL 89* 93*  CO2 24 25  GLUCOSE 217* 191*  BUN 11 11  CREATININE 0.59 0.58  CALCIUM 8.8 9.3    Studies/Results: Ct Head Wo Contrast  03/15/2011  *RADIOLOGY REPORT*  Clinical Data: Syncope, falls, hypertension, history seizures, traumatic brain injury at age 29  CT HEAD WITHOUT CONTRAST  Technique:  Contiguous axial images were obtained from the base of the skull through the vertex without contrast.  Comparison: 10/01/2010  Findings: Normal ventricular morphology. No midline shift or mass effect. Normal appearance of brain parenchyma. No intracranial hemorrhage, mass lesion, or acute infarction. Visualized paranasal sinuses and mastoid air cells clear. Bones unremarkable. Dense calcification anterior falx stable.  IMPRESSION: No acute intracranial abnormalities.  Original Report Authenticated By: Lollie Marrow, M.D.    Medications:  Scheduled:    . aspirin EC  81 mg Oral QPM  . enoxaparin  40 mg Subcutaneous Q24H  . FLUoxetine  60 mg Oral Daily  .  gabapentin  100 mg Oral TID  .  HYDROmorphone (DILAUDID) injection  0.5 mg Intravenous Once  . insulin aspart  0-15 Units Subcutaneous TID WC  . insulin aspart  0-5 Units Subcutaneous QHS  . insulin aspart   5 Units Subcutaneous TID WC  . insulin glargine  18 Units Subcutaneous QHS  . pantoprazole  80 mg Oral Q1200    Assessment/Plan:  Principal Problem:  *Hyponatremia Active Problems:  Diabetes mellitus with neuropathy  Weakness generalized  Headache    1. Hyponatremia: Still low. Apparently patient continues to drink a lot despite order for fluid restriction. Have notified RN to restrict fluids. Urine osmolality is high along with high urine sodium and low serum osm. 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 Sodium level was 129 in November 2012 but was normal prior to that. Per patient he has been drinking a lot of fluids ever since he has noted that he has been losing weight. TSH and cortisol levels are normal. Further work up of SIADH deferred to PCP.  2. Diabetes mellitus with neuropathy: Blood sugar is better this morning. Will go up slightly more on lantus. Patient was taking insulin 70/30 and Lantus insulin and novolog at home. Hemoglobin A1c is 12.2. For neuropathy, gabapentin has been initiated. Check b12 level.   3. Weakness generalized: Multifactorial, probably secondary to combination of hyponatremia and diabetic neuropathy and weight loss. PT/OT consult.  4. Headache: CT unremarkable. Monitor.   5. Chronic osteomyelitis of the left foot: He completed course of antibiotics, followed by Dr. Algis Liming as an outpatient.   6. DVT prophylaxis: Lovenox   Disposition: Hopefully home 03/18/11  PT recommends HHPT.    LOS: 2 days   [facility] Pager [phone] 03/17/2011, 2:50 PM

## 2011-03-18 LAB — BASIC METABOLIC PANEL
CO2: 25 mEq/L (ref 19–32)
Calcium: 9 mg/dL (ref 8.4–10.5)
Creatinine, Ser: 0.6 mg/dL (ref 0.50–1.35)
Glucose, Bld: 195 mg/dL — ABNORMAL HIGH (ref 70–99)

## 2011-03-18 LAB — GLUCOSE, CAPILLARY: Glucose-Capillary: 182 mg/dL — ABNORMAL HIGH (ref 70–99)

## 2011-03-18 MED ORDER — INSULIN GLARGINE 100 UNIT/ML ~~LOC~~ SOLN: 22.0000 [IU] | Freq: Every day | SUBCUTANEOUS | Status: DC

## 2011-03-18 MED ORDER — INSULIN ASPART 100 UNIT/ML ~~LOC~~ SOLN: 8.0000 [IU] | Freq: Three times a day (TID) | SUBCUTANEOUS | Status: DC

## 2011-03-18 MED ORDER — GABAPENTIN 100 MG PO CAPS: 100.0000 mg | ORAL_CAPSULE | Freq: Three times a day (TID) | ORAL | Status: DC

## 2011-03-18 NOTE — Progress Notes (Signed)
Pt. D/c instructions provided. Pt verbalized understanding. Pt iv d/c intact. Pt under no s/s distress. Pt prescriptions provided.

## 2011-03-18 NOTE — Progress Notes (Signed)
   CARE MANAGEMENT NOTE 03/18/2011  Patient:  [name], [name]   Account Number:  [id number]  Date Initiated:  03/16/2011  Documentation initiated by:  MAYO,HENRIETTA  Subjective/Objective Assessment:   56 yr-old male adm with uncontrolled DM, hyponatremia; lives with mother, has walker, tub seat,  elevated toilet seat, and glucometer.  Previously received home health services through [facility].     Action/Plan:   Anticipated DC Date:  03/18/2011   Anticipated DC Plan:  HOME W HOME HEALTH SERVICES      DC Planning Services  CM consult      [facility] Choice  HOME HEALTH   Choice offered to / List presented to:  C-1 Patient        HH arranged  HH-1 RN  HH-10 DISEASE MANAGEMENT  HH-2 PT      HH agency  [facility]   Status of service:   Medicare Important Message given?   (If response is "NO", the following Medicare IM given date fields will be blank) Date Medicare IM given:   Date Additional Medicare IM given:    Discharge Disposition:  HOME W HOME HEALTH SERVICES  Per UR Regulation:  Reviewed for med. necessity/level of care/duration of stay  Comments:  PCP:  Dr. Della Goo  Contact:  Roque Schill, mother  [phone] 2/1 have aleted mary w [facility] of disch today. debbie Alzora Ha rn,bsn 1/31 have alerted mary w [facility] that pt for disch today. Suella Grove [phone] 03/16/11 1450 Henrietta Mayo RN MSN CCM Per PT, pt will need home health PT services, will also need RN to assist with DM mgmt.  Pt states he has an old glucometer but can't afford the strips.  Provided information re ReliOn monitor which uses less expensive strips.  Provided list of [facility] agencies, referral made per pt choice.

## 2011-03-18 NOTE — Discharge Summary (Signed)
Physician Discharge Summary  Patient ID: [name] MRN: [id number] DOB/AGE: [birth year] 56 y.o.  Admit date: 03/15/2011 Discharge date: 03/18/2011  Discharge Diagnoses:  Principal Problem:  *Hyponatremia Active Problems:  Diabetes mellitus with neuropathy  Weakness generalized  Headache   Discharged Condition: fair  Initial History: This is a 56 years old Caucasian man with history of diabetes mellitus complicated with diabetic neuropathy, presented to the ER with chief complaint of generalized weakness associated with numbness in his feet and hands which he stated his chronic for years, he was being treated with amitriptyline, he was given a prescription for amitriptyline by his PCP recently however he was unable to refill it yet. He stated that he had a couple of falls recently because of generalized weakness, he denies any preceding symptoms such as palpitations, chest pain, nausea or dizziness. He also complained of headache, started 2 days ago, similar to previous headaches which he described as cluster with headache.  Hospital Course:   1. 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 Patient's urine osmolality was high, and the serum osmolality was low. Urine sodium was also on the higher side. There was also an element of polydipsia. Patient has been consuming a lot of fluids over the last many months which may also have contributed to hyponatremia. This could be because of the fact, that he was hyperglycemic. I will defer further workup of this hyponatremia to his primary care physician. His sodium level this morning is 125. He is completely asymptomatic with that. Once, again, I went over fluid restriction with the patient. The patient is also concerned about weight loss issues, which is under evaluation by his PCP. TSH and cortisol levels are normal.  2. Diabetes mellitus with neuropathy: Hemoglobin A1c is 12.2, implying very poor control of his  diabetes. It appears patient was taking 3 different kinds of insulin at home, including NovoLog, Lantus, as well as NPH. In order to streamline his diabetic management he was asked to take Lantus along with NovoLog for meal coverage. The dose of Lantus was increased today to achieve better control. Patient was also seen by the diabetes coordinator in the hospital. Patient also has significant peripheral neuropathy from his diabetes. Amitriptyline was recently started by his primary care physician, which was held during the time of admission because of concern about hyponatremia. At discharge patient was started on Neurontin. His B12 level was normal.  3. Weakness generalized: Multifactorial, probably secondary to combination of hyponatremia and diabetic neuropathy and weight loss. PT/OT consult was obtained, and they recommended home health PT, which was arranged for the patient. He was able to ambulate with a walker at the time of discharge.  4. Headache: Etiology of this headache was unclear. CT scan of the head did not show any acute abnormalities. The pain subsided after he received a dose of Dilaudid.   5. Chronic osteomyelitis of the left foot: He recently completed course of antibiotics, followed by Dr. Algis Liming as an outpatient.   He needs to followup with his primary care physician as soon as possible. He has an appointment on February 6. A repeat BMET will need to be obtained to check his sodium level.  At this time patient is stable for discharge home.   PERTINENT LABS  Sodium was 122 at admission, and 1, 25 discharge. HbA1c was 12.2. Urine osmolality was 343. Urine sodium 57. Serum osmolality was calculated to be in the 200s.  IMAGING STUDIES Ct Head Wo  Contrast  03/15/2011  *RADIOLOGY REPORT*  Clinical Data: Syncope, falls, hypertension, history seizures, traumatic brain injury at age 22  CT HEAD WITHOUT CONTRAST  Technique:  Contiguous axial images were obtained from the base of the  skull through the vertex without contrast.  Comparison: 10/01/2010  Findings: Normal ventricular morphology. No midline shift or mass effect. Normal appearance of brain parenchyma. No intracranial hemorrhage, mass lesion, or acute infarction. Visualized paranasal sinuses and mastoid air cells clear. Bones unremarkable. Dense calcification anterior falx stable.  IMPRESSION: No acute intracranial abnormalities.  Original Report Authenticated By: Lollie Marrow, M.D.    Discharge Exam: Blood pressure 152/89, pulse 91, temperature 97.7 F (36.5 C), temperature source Oral, resp. rate 18, height 5' 10.5" (1.791 m), weight 54.432 kg (120 lb), SpO2 96.00%. General appearance: alert, cooperative, appears stated age and no distress Resp: clear to auscultation bilaterally Cardio: regular rate and rhythm, S1, S2 normal, no murmur, click, rub or gallop GI: soft, non-tender; bowel sounds normal; no masses,  no organomegaly No focal neurological deficits were present   Disposition: Home or Self Care  Discharge Orders    Future Orders Please Complete By Expires   Diet Carb Modified      Increase activity slowly        Current Discharge Medication List    START taking these medications   Details  gabapentin (NEURONTIN) 100 MG capsule Take 1 capsule (100 mg total) by mouth 3 (three) times daily. Qty: 90 capsule, Refills: 1      CONTINUE these medications which have CHANGED   Details  insulin aspart (NOVOLOG) 100 UNIT/ML injection Inject 8 Units into the skin 3 (three) times daily with meals. Qty: 1 vial, Refills: 3    insulin glargine (LANTUS) 100 UNIT/ML injection Inject 22 Units into the skin at bedtime. Qty: 10 mL, Refills: 3      CONTINUE these medications which have NOT CHANGED   Details  aspirin 81 MG tablet Take 81 mg by mouth every evening.      diphenhydrAMINE (SOMINEX) 25 MG tablet Take 25 mg by mouth at bedtime as needed. For sleep    FLUoxetine (PROZAC) 20 MG capsule Take 60 mg  by mouth daily.    fluticasone (FLONASE) 50 MCG/ACT nasal spray Place 2 sprays into the nose daily as needed. For allergies    HYDROcodone-acetaminophen (NORCO) 5-325 MG per tablet Take 1 tablet by mouth every 6 (six) hours as needed. For pain    omeprazole (PRILOSEC) 40 MG capsule Take 40 mg by mouth daily.      STOP taking these medications     amitriptyline (ELAVIL) 100 MG tablet      insulin aspart protamine-insulin aspart (NOVOLOG 70/30) (70-30) 100 UNIT/ML injection      esomeprazole (NEXIUM) 40 MG capsule        Follow-up Information    Follow up with Ron Parker, MD on 03/23/2011. (repeat sodium levels at follow up)          Total Discharge Time: 35 mins  [facility] Pager [phone]  03/18/2011, 9:46 AM

## 2011-03-18 NOTE — Progress Notes (Signed)
Physical Therapy Treatment Patient Details Name: [name] MRN: [id number] DOB: [birth year] Today's Date: 03/18/2011  PT Assessment/Plan  PT - Assessment/Plan Comments on Treatment Session: Pt agreeable to ambulation however patient limited himself in order to conserve his energy for discharge.  PT Plan: Discharge plan remains appropriate PT Frequency: Min 3X/week Follow Up Recommendations: Home health PT Equipment Recommended: None recommended by PT;None recommended by OT PT Goals  Acute Rehab PT Goals PT Goal: Sit to Stand - Progress: Met PT Goal: Stand to Sit - Progress: Met PT Goal: Ambulate - Progress: Progressing toward goal  PT Treatment Precautions/Restrictions  Precautions Precautions: Fall Precaution Comments: Pt reports up to 20 falls/year Required Braces or Orthoses: Yes Other Brace/Splint: CAM boot L foot Restrictions Weight Bearing Restrictions: No Mobility (including Balance) Bed Mobility Supine to Sit: 7: Independent Transfers Sit to Stand: 6: Modified independent (Device/Increase time) Stand to Sit: 6: Modified independent (Device/Increase time) Ambulation/Gait Ambulation/Gait Assistance: 5: Supervision Ambulation/Gait Assistance Details (indicate cue type and reason): Cues for safety and positioning within RW. Pt. tends to keep RW too far out in front of him Ambulation Distance (Feet): 125 Feet Assistive device: Rolling walker Gait Pattern: Trunk flexed    Exercise    End of Session PT - End of Session Equipment Utilized During Treatment: Gait belt Activity Tolerance: Patient tolerated treatment well Patient left: in chair;with call bell in reach General Behavior During Session: [facility] for tasks performed Cognition: [facility] for tasks performed  Fredrich Birks 03/18/2011, 11:09 AM 03/18/2011 Fredrich Birks PTA [phone] pager [phone] office

## 2011-04-01 ENCOUNTER — Emergency Department (HOSPITAL_COMMUNITY): Payer: Medicaid Other

## 2011-04-01 ENCOUNTER — Emergency Department (HOSPITAL_COMMUNITY)
Admission: EM | Admit: 2011-04-01 | Discharge: 2011-04-01 | Disposition: A | Discharge status: Home / Self Care | Payer: Medicaid Other | Attending: Emergency Medicine | Admitting: Emergency Medicine

## 2011-04-01 ENCOUNTER — Other Ambulatory Visit: Payer: Self-pay

## 2011-04-01 ENCOUNTER — Encounter (HOSPITAL_COMMUNITY): Payer: Self-pay | Admitting: Emergency Medicine

## 2011-04-01 DIAGNOSIS — R5381 Other malaise: Secondary | ICD-10-CM | POA: Insufficient documentation

## 2011-04-01 DIAGNOSIS — E1142 Type 2 diabetes mellitus with diabetic polyneuropathy: Secondary | ICD-10-CM | POA: Insufficient documentation

## 2011-04-01 DIAGNOSIS — E1149 Type 2 diabetes mellitus with other diabetic neurological complication: Secondary | ICD-10-CM | POA: Insufficient documentation

## 2011-04-01 DIAGNOSIS — H409 Unspecified glaucoma: Secondary | ICD-10-CM | POA: Insufficient documentation

## 2011-04-01 DIAGNOSIS — R531 Weakness: Secondary | ICD-10-CM

## 2011-04-01 DIAGNOSIS — R197 Diarrhea, unspecified: Secondary | ICD-10-CM | POA: Insufficient documentation

## 2011-04-01 DIAGNOSIS — L97409 Non-pressure chronic ulcer of unspecified heel and midfoot with unspecified severity: Secondary | ICD-10-CM | POA: Insufficient documentation

## 2011-04-01 DIAGNOSIS — Z7982 Long term (current) use of aspirin: Secondary | ICD-10-CM | POA: Insufficient documentation

## 2011-04-01 DIAGNOSIS — R61 Generalized hyperhidrosis: Secondary | ICD-10-CM | POA: Insufficient documentation

## 2011-04-01 DIAGNOSIS — K219 Gastro-esophageal reflux disease without esophagitis: Secondary | ICD-10-CM | POA: Insufficient documentation

## 2011-04-01 DIAGNOSIS — E1140 Type 2 diabetes mellitus with diabetic neuropathy, unspecified: Secondary | ICD-10-CM

## 2011-04-01 DIAGNOSIS — F341 Dysthymic disorder: Secondary | ICD-10-CM | POA: Insufficient documentation

## 2011-04-01 DIAGNOSIS — Z794 Long term (current) use of insulin: Secondary | ICD-10-CM | POA: Insufficient documentation

## 2011-04-01 DIAGNOSIS — R079 Chest pain, unspecified: Secondary | ICD-10-CM | POA: Insufficient documentation

## 2011-04-01 DIAGNOSIS — Z8659 Personal history of other mental and behavioral disorders: Secondary | ICD-10-CM | POA: Insufficient documentation

## 2011-04-01 DIAGNOSIS — R05 Cough: Secondary | ICD-10-CM | POA: Insufficient documentation

## 2011-04-01 DIAGNOSIS — E871 Hypo-osmolality and hyponatremia: Secondary | ICD-10-CM

## 2011-04-01 DIAGNOSIS — R059 Cough, unspecified: Secondary | ICD-10-CM | POA: Insufficient documentation

## 2011-04-01 DIAGNOSIS — R0602 Shortness of breath: Secondary | ICD-10-CM | POA: Insufficient documentation

## 2011-04-01 DIAGNOSIS — G4733 Obstructive sleep apnea (adult) (pediatric): Secondary | ICD-10-CM | POA: Insufficient documentation

## 2011-04-01 DIAGNOSIS — Z79899 Other long term (current) drug therapy: Secondary | ICD-10-CM | POA: Insufficient documentation

## 2011-04-01 LAB — BASIC METABOLIC PANEL
BUN: 7 mg/dL (ref 6–23)
Calcium: 9.5 mg/dL (ref 8.4–10.5)
GFR calc Af Amer: >90 mL/min (ref >90)
GFR calc non Af Amer: >90 mL/min (ref >90)
Glucose, Bld: 89 mg/dL (ref 70–99)
Potassium: 3.9 mEq/L (ref 3.5–5.1)

## 2011-04-01 LAB — CBC
MCH: 28.9 pg (ref 26.0–34.0)
MCHC: 35.1 g/dL (ref 30.0–36.0)
Platelets: 322 10*3/uL (ref 150–400)
RDW: 14.1 % (ref 11.5–15.5)

## 2011-04-01 LAB — TROPONIN I: Troponin I: <0.3 ng/mL (ref <0.30)

## 2011-04-01 LAB — URINALYSIS, ROUTINE W REFLEX MICROSCOPIC
Hgb urine dipstick: NEGATIVE
Leukocytes, UA: NEGATIVE
Nitrite: NEGATIVE
Protein, ur: NEGATIVE mg/dL
Specific Gravity, Urine: 1.014 (ref 1.005–1.030)
Urobilinogen, UA: 1 mg/dL (ref 0.0–1.0)

## 2011-04-01 LAB — DIFFERENTIAL
Basophils Absolute: 0 10*3/uL (ref 0.0–0.1)
Basophils Relative: 0 % (ref 0–1)
Eosinophils Absolute: 0.1 10*3/uL (ref 0.0–0.7)
Neutro Abs: 4.8 10*3/uL (ref 1.7–7.7)
Neutrophils Relative %: 64 % (ref 43–77)

## 2011-04-01 LAB — GLUCOSE, CAPILLARY: Glucose-Capillary: 193 mg/dL — ABNORMAL HIGH (ref 70–99)

## 2011-04-01 MED ORDER — SODIUM CHLORIDE 0.9 % IV SOLN
INTRAVENOUS | Status: DC
  Administered 2011-04-01: 17:00:00 via INTRAVENOUS

## 2011-04-01 MED ORDER — LORAZEPAM 2 MG/ML IJ SOLN
1.0000 mg | Freq: Once | INTRAMUSCULAR | Status: AC
  Administered 2011-04-01: 1 mg via INTRAVENOUS
  Filled 2011-04-01: qty 1

## 2011-04-01 NOTE — ED Notes (Signed)
Pt is here because he fell and he checked his sugar and it was high 433 took 10 units  States feels weak and nauseted

## 2011-04-01 NOTE — ED Provider Notes (Signed)
History     CSN: [id number]  Arrival date & time 04/01/11  1425   First MD Initiated Contact with Patient 04/01/11 1627      Chief Complaint  Patient presents with  . Hyperglycemia    (Consider location/radiation/quality/duration/timing/severity/associated sxs/prior treatment) The history is provided by the patient.   patient presents with generalized weakness. He states she's felt bad the last several weeks. Patient has recently severe. out of control and he was admitted recently for an infection hyperglycemia and low sodium. He states his sugars been running high and low at home. His sugar was 433 home to 10 units. Is 100 upon arrival here. He states he feels weak. Occasional cough. Occasional chest pain. Occasional diarrhea. He has a healing wound on his left foot. 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 He states he had anxiety and PTSD both been recently severe. He states he has some sweating at night. Is not on antibiotics for his foot infection  Past Medical History  Diagnosis Date  . Osteomyelitis of toe of left foot 10/24/10    fifth metatarsal base  . Diabetic toe ulcer 10/24/10    Deep abscess   . Diabetes mellitus   . GERD (gastroesophageal reflux disease)   . Obstructive sleep apnea   . PTSD (post-traumatic stress disorder)   . Depression   . Anxiety   . Schizophrenia   . History of migraine headaches   . Glaucoma     Past Surgical History  Procedure Date  . Incise and drain abcess 10/20/10    left foot  . Bone resection 10/19/10    resection of the fifth metatarsal base w/ VAC application  . Foot tendon transfer 10/19/10    left ankle peroneus brevis to peroneus longus     No family history on file.  History  Substance Use Topics  . Smoking status: Never Smoker   . Smokeless tobacco: Not on file  . Alcohol Use: No      Review of Systems  Constitutional: Positive for fatigue. Negative for activity change and appetite change.  HENT:  Negative for neck stiffness.   Eyes: Negative for pain.  Respiratory: Positive for shortness of breath. Negative for chest tightness.   Cardiovascular: Positive for chest pain. Negative for leg swelling.  Gastrointestinal: Positive for diarrhea. Negative for nausea, vomiting and abdominal pain.  Genitourinary: Negative for flank pain.  Musculoskeletal: Negative for back pain.  Skin: Negative for rash.  Neurological: Negative for weakness, numbness and headaches.  Psychiatric/Behavioral: Negative for behavioral problems.       Anxiety    Allergies  Aspirin  Home Medications   Current Outpatient Rx  Name Route Sig Dispense Refill  . ASPIRIN 81 MG PO TABS Oral Take 81 mg by mouth every evening.      [name] DIPHENHYDRAMINE HCL (SLEEP) 25 MG PO TABS Oral Take 25 mg by mouth at bedtime as needed. For sleep    . FLUOXETINE HCL 20 MG PO CAPS Oral Take 60 mg by mouth daily.    [name] FLUTICASONE PROPIONATE 50 MCG/ACT NA SUSP Nasal Place 2 sprays into the nose daily as needed. For allergies    . GABAPENTIN 100 MG PO CAPS Oral Take 1 capsule (100 mg total) by mouth 3 (three) times daily. 90 capsule 1  . HYDROCODONE-ACETAMINOPHEN 5-325 MG PO TABS Oral Take 1 tablet by mouth every 6 (six) hours as needed. For pain    . INSULIN ASPART 100 UNIT/ML [place] SOLN Subcutaneous  Inject 1-10 Units into the skin 3 (three) times daily with meals. Sliding scale    . INSULIN GLARGINE 100 UNIT/ML [place] SOLN Subcutaneous Inject 25 Units into the skin at bedtime.    . OMEPRAZOLE 40 MG PO CPDR Oral Take 40 mg by mouth daily.      BP 139/120  Pulse 87  Temp(Src) 98.3 F (36.8 C) (Oral)  Resp 17  SpO2 99%  Physical Exam  Nursing note and vitals reviewed. Constitutional: He is oriented to person, place, and time. He appears well-developed and well-nourished.  HENT:  Head: Normocephalic and atraumatic.  Neck: Normal range of motion. Neck supple.  Cardiovascular: Normal rate, regular rhythm and normal heart sounds.   No  murmur heard. Pulmonary/Chest: Effort normal and breath sounds normal.  Abdominal: Soft. Bowel sounds are normal. He exhibits no distension and no mass. There is no tenderness. There is no rebound and no guarding.  Musculoskeletal: Normal range of motion. He exhibits no edema.       Left heel with ulcer. No drainage. No erythema.  Neurological: He is alert and oriented to person, place, and time. No cranial nerve deficit.  Skin: Skin is warm and dry.  Psychiatric: He has a normal mood and affect.    ED Course  Procedures (including critical care time)  Labs Reviewed  GLUCOSE, CAPILLARY - Abnormal; Notable for the following:    Glucose-Capillary 193 (*)    All other components within normal limits  GLUCOSE, CAPILLARY - Abnormal; Notable for the following:    Glucose-Capillary 100 (*)    All other components within normal limits  BASIC METABOLIC PANEL - Abnormal; Notable for the following:    Sodium 129 (*)    Chloride 93 (*)    All other components within normal limits  CBC - Abnormal; Notable for the following:    Hemoglobin 12.3 (*)    HCT 35.0 (*)    All other components within normal limits  URINALYSIS, ROUTINE W REFLEX MICROSCOPIC - Abnormal; Notable for the following:    Glucose, UA 500 (*)    All other components within normal limits  DIFFERENTIAL  TROPONIN I   Dg Chest 2 View  04/01/2011  *RADIOLOGY REPORT*  Clinical Data: Weakness, diabetes, syncope  CHEST - 2 VIEW  Comparison: 10/22/2010  Findings: Normal heart size, mediastinal contours, and pulmonary vascularity. Bronchitic changes without infiltrate or effusion. No pneumothorax. Calcified loose bodies adjacent to the left humeral head. No acute osseous findings.  IMPRESSION: Bronchitic changes.  Original Report Authenticated By: Lollie Marrow, M.D.     1. Hyponatremia   2. Weakness generalized   3. Diabetes mellitus with neuropathy      Date: 04/01/2011  Rate: 84  Rhythm: normal sinus rhythm  QRS Axis:  normal  Intervals: normal  ST/T Wave abnormalities: normal  Conduction Disutrbances:none  Narrative Interpretation:   Old EKG Reviewed: none available  \   MDM  Multiple complaints. Hypoglycemia now resolved. His hyponatremia is the best is present in the ER previously. He tolerated orals here and feels better. He'll be discharged home follow up with his Dr.        Harrold Donath R. Rubin Payor, MD 04/01/11 2031

## 2011-04-01 NOTE — Discharge Instructions (Signed)
Fatigue Fatigue is a feeling of tiredness, lack of energy, lack of motivation, or feeling tired all the time. Having enough rest, good nutrition, and reducing stress will normally reduce fatigue. Consult your caregiver if it persists. The nature of your fatigue will help your caregiver to find out its cause. The treatment is based on the cause.  CAUSES  There are many causes for fatigue. Most of the time, fatigue can be traced to one or more of your habits or routines. Most causes fit into one or more of three general areas. They are: Lifestyle problems  Sleep disturbances.   Overwork.   Physical exertion.   Unhealthy habits.   Poor eating habits or eating disorders.   Alcohol and/or drug use .   Lack of proper nutrition (malnutrition).  Psychological problems  Stress and/or anxiety problems.   Depression.   Grief.   Boredom.  Medical Problems or Conditions  Anemia.   Pregnancy.   Thyroid gland problems.   Recovery from major surgery.   Continuous pain.   Emphysema or asthma that is not well controlled   Allergic conditions.   Diabetes.   Infections (such as mononucleosis).   Obesity.   Sleep disorders, such as sleep apnea.   Heart failure or other heart-related problems.   Cancer.   Kidney disease.   Liver disease.   Effects of certain medicines such as antihistamines, cough and cold remedies, prescription pain medicines, heart and blood pressure medicines, drugs used for treatment of cancer, and some antidepressants.  SYMPTOMS  The symptoms of fatigue include:   Lack of energy.   Lack of drive (motivation).   Drowsiness.   Feeling of indifference to the surroundings.  DIAGNOSIS  The details of how you feel help guide your caregiver in finding out what is causing the fatigue. You will be asked about your present and past health condition. It is important to review all medicines that you take, including prescription and non-prescription items. A  thorough exam will be done. You will be questioned about your feelings, habits, and normal lifestyle. Your caregiver may suggest blood tests, urine tests, or other tests to look for common medical causes of fatigue.  TREATMENT  Fatigue is treated by correcting the underlying cause. For example, if you have continuous pain or depression, treating these causes will improve how you feel. Similarly, adjusting the dose of certain medicines will help in reducing fatigue.  HOME CARE INSTRUCTIONS   Try to get the required amount of good sleep every night.   Eat a healthy and nutritious diet, and drink enough water throughout the day.   Practice ways of relaxing (including yoga or meditation).   Exercise regularly.   Make plans to change situations that cause stress. Act on those plans so that stresses decrease over time. Keep your work and personal routine reasonable.   Avoid street drugs and minimize use of alcohol.   Start taking a daily multivitamin after consulting your caregiver.  SEEK MEDICAL CARE IF:   You have persistent tiredness, which cannot be accounted for.   You have fever.   You have unintentional weight loss.   You have headaches.   You have disturbed sleep throughout the night.   You are feeling sad.   You have constipation.   You have dry skin.   You have gained weight.   You are taking any new or different medicines that you suspect are causing fatigue.   You are unable to sleep at night.     You develop any unusual swelling of your legs or other parts of your body.  SEEK IMMEDIATE MEDICAL CARE IF:   You are feeling confused.   Your vision is blurred.   You feel faint or pass out.   You develop severe headache.   You develop severe abdominal, pelvic, or back pain.   You develop chest pain, shortness of breath, or an irregular or fast heartbeat.   You are unable to pass a normal amount of urine.   You develop abnormal bleeding such as bleeding from  the rectum or you vomit blood.   You have thoughts about harming yourself or committing suicide.   You are worried that you might harm someone else.  MAKE SURE YOU:   Understand these instructions.   Will watch your condition.   Will get help right away if you are not doing well or get worse.  Document Released: 11/28/2006 Document Revised: 10/13/2010 Document Reviewed: 11/28/2006 [facility] Patient Information 2012 [place], [place].Hyponatremia  Hyponatremia is when the amount of salt (sodium) in your blood is too low. When sodium levels are low, your cells will absorb extra water and swell. The swelling happens throughout the body, but it mostly affects the brain. Severe brain swelling (cerebral edema), seizures, or coma can happen.  CAUSES   Heart, kidney, or liver problems.   Thyroid problems.   Adrenal gland problems.   Severe vomiting and diarrhea.   Certain medicines or illegal drugs.   Dehydration.   Drinking too much water.   Low-sodium diet.  SYMPTOMS   Nausea and vomiting.   Confusion.   Lethargy.   Agitation.   Headache.   Twitching or shaking (seizures).   Unconsciousness.   Appetite loss.   Muscle weakness and cramping.  DIAGNOSIS  Hyponatremia is identified by a simple blood test. Your caregiver will perform a history and physical exam to try to find the cause and type of hyponatremia. Other tests may be needed to measure the amount of sodium in your blood and urine. TREATMENT  Treatment will depend on the cause.   Fluids may be given through the vein (IV).   Medicines may be used to correct the sodium imbalance. If medicines are causing the problem, they will need to be adjusted.   Water or fluid intake may be restricted to restore proper balance.  The speed of correcting the sodium problem is very important. If the problem is corrected too fast, nerve damage (sometimes unchangeable) can happen. HOME CARE INSTRUCTIONS   Only take medicines  as directed by your caregiver. Many medicines can make hyponatremia worse. Discuss all your medicines with your caregiver.   Carefully follow any recommended diet, including any fluid restrictions.   You may be asked to repeat lab tests. Follow these directions.   Avoid alcohol and recreational drugs.  SEEK MEDICAL CARE IF:   You develop worsening nausea, fatigue, headache, confusion, or weakness.   Your original hyponatremia symptoms return.   You have problems following the recommended diet.  SEEK IMMEDIATE MEDICAL CARE IF:   You have a seizure.   You faint.   You have ongoing diarrhea or vomiting.  MAKE SURE YOU:   Understand these instructions.   Will watch your condition.   Will get help right away if you are not doing well or get worse.  Document Released: 01/21/2002 Document Revised: 10/13/2010 Document Reviewed: 07/18/2010 [facility] Patient Information 2012 [place], [place].

## 2011-04-19 ENCOUNTER — Ambulatory Visit (INDEPENDENT_AMBULATORY_CARE_PROVIDER_SITE_OTHER): Payer: Medicaid Other | Admitting: Infectious Disease

## 2011-04-19 ENCOUNTER — Encounter: Payer: Self-pay | Admitting: Infectious Disease

## 2011-04-19 VITALS — BP 136/66 | HR 83 | Temp 98.2°F | Ht 70.0 in | Wt 175.0 lb

## 2011-04-19 DIAGNOSIS — M869 Osteomyelitis, unspecified: Secondary | ICD-10-CM

## 2011-04-19 DIAGNOSIS — A4901 Methicillin susceptible Staphylococcus aureus infection, unspecified site: Secondary | ICD-10-CM

## 2011-04-19 NOTE — Assessment & Plan Note (Signed)
No evidence of recurrence 

## 2011-04-19 NOTE — Progress Notes (Signed)
  Subjective:    Patient ID: [name], male    DOB: 1955/03/04, 56 y.o.   MRN: [id number]  HPI  56 year old male with known history of diabetes mellitus type 2, hypertension, neuropathy, post traumatic  stress disorder, schizophrenia, sleep apnea who was admitted to [facility] with osteomyelitis of the foot. He underwent two separate I and D of bone by Dr Victorino Dike with I and D of left foot abscess and resection of part of the 5th metatarsal with MSSA growing from cultures. He was narrowed to cefazolin and remained on this since dc in late September. Into October when they were stopped.  His foot pain has improved dramatically. Followed closely by Dr. Victorino Dike who is released him from his care. He has had no fevers or chills return or systemic symptoms. He apparently was admitted to [facility] recently for problems related to his diabetes and hyponatremia which have now improved. He otherwise been well.  Review of Systems  Constitutional: Negative for fever, chills, diaphoresis, activity change, appetite change, fatigue and unexpected weight change.  HENT: Negative for congestion, sore throat, rhinorrhea, sneezing, trouble swallowing and sinus pressure.   Eyes: Negative for photophobia and visual disturbance.  Respiratory: Negative for cough, chest tightness, shortness of breath, wheezing and stridor.   Cardiovascular: Negative for chest pain, palpitations and leg swelling.  Gastrointestinal: Negative for nausea, vomiting, abdominal pain, diarrhea, constipation, blood in stool, abdominal distention and anal bleeding.  Genitourinary: Negative for dysuria, hematuria, flank pain and difficulty urinating.  Musculoskeletal: Positive for joint swelling. Negative for myalgias, back pain, arthralgias and gait problem.  Skin: Positive for wound. Negative for color change, pallor and rash.  Neurological: Negative for dizziness, tremors, weakness and light-headedness.  Hematological: Negative for adenopathy. Does  not bruise/bleed easily.  Psychiatric/Behavioral: Negative for behavioral problems, confusion, sleep disturbance, dysphoric mood, decreased concentration and agitation.       Objective:   Physical Exam  Constitutional: He is oriented to person, place, and time. He appears well-developed and well-nourished. No distress.  HENT:  Head: Normocephalic and atraumatic.  Mouth/Throat: Oropharynx is clear and moist. No oropharyngeal exudate.  Eyes: Conjunctivae and EOM are normal. Pupils are equal, round, and reactive to light. No scleral icterus.  Neck: Normal range of motion. Neck supple. No JVD present.  Cardiovascular: Normal rate, regular rhythm and normal heart sounds.  Exam reveals no gallop and no friction rub.   No murmur heard. Pulmonary/Chest: Effort normal and breath sounds normal. No respiratory distress. He has no wheezes. He has no rales. He exhibits no tenderness.  Abdominal: He exhibits no distension and no mass. There is no tenderness. There is no rebound and no guarding.  Musculoskeletal: He exhibits no edema and no tenderness.       Feet:  Lymphadenopathy:    He has no cervical adenopathy.  Neurological: He is alert and oriented to person, place, and time. He has normal reflexes. He exhibits normal muscle tone. Coordination normal.  Skin: Skin is warm and dry. He is not diaphoretic. No erythema. No pallor.  Psychiatric: He has a normal mood and affect. His behavior is normal. Judgment and thought content normal.          Assessment & Plan:  Osteomyelitis of toe of left foot Check cbc, cmp, esr, crp, observe off antibiotics  MSSA (methicillin susceptible Staphylococcus aureus) infection No evidence of recurrence

## 2011-04-19 NOTE — Assessment & Plan Note (Signed)
Check cbc, cmp, esr, crp, observe off antibiotics

## 2011-04-20 LAB — BASIC METABOLIC PANEL WITHOUT GFR
BUN: 9 mg/dL (ref 6–23)
CO2: 26 meq/L (ref 19–32)
Calcium: 9.4 mg/dL (ref 8.4–10.5)
Chloride: 99 meq/L (ref 96–112)
Creat: 0.79 mg/dL (ref 0.50–1.35)
GFR, Est African American: >89 mL/min
GFR, Est Non African American: >89 mL/min
Glucose, Bld: 190 mg/dL — ABNORMAL HIGH (ref 70–99)
Potassium: 4.8 meq/L (ref 3.5–5.3)
Sodium: 133 meq/L — ABNORMAL LOW (ref 135–145)

## 2011-04-20 LAB — CBC WITH DIFFERENTIAL/PLATELET
Basophils Absolute: 0 K/uL (ref 0.0–0.1)
Basophils Relative: 0 % (ref 0–1)
Eosinophils Absolute: 0.2 K/uL (ref 0.0–0.7)
Eosinophils Relative: 3 % (ref 0–5)
HCT: 38.7 % — ABNORMAL LOW (ref 39.0–52.0)
Hemoglobin: 13.2 g/dL (ref 13.0–17.0)
Lymphocytes Relative: 29 % (ref 12–46)
Lymphs Abs: 1.8 K/uL (ref 0.7–4.0)
MCH: 29.1 pg (ref 26.0–34.0)
MCHC: 34.1 g/dL (ref 30.0–36.0)
MCV: 85.2 fL (ref 78.0–100.0)
Monocytes Absolute: 0.3 K/uL (ref 0.1–1.0)
Monocytes Relative: 4 % (ref 3–12)
Neutro Abs: 4 K/uL (ref 1.7–7.7)
Neutrophils Relative %: 64 % (ref 43–77)
Platelets: 367 K/uL (ref 150–400)
RBC: 4.54 MIL/uL (ref 4.22–5.81)
RDW: 14.2 % (ref 11.5–15.5)
WBC: 6.2 K/uL (ref 4.0–10.5)

## 2011-07-26 ENCOUNTER — Encounter (HOSPITAL_COMMUNITY): Payer: Self-pay

## 2011-07-26 ENCOUNTER — Emergency Department (HOSPITAL_COMMUNITY)
Admission: EM | Admit: 2011-07-26 | Discharge: 2011-07-26 | Disposition: A | Discharge status: Home / Self Care | Payer: Medicaid Other | Attending: Emergency Medicine | Admitting: Emergency Medicine

## 2011-07-26 DIAGNOSIS — E871 Hypo-osmolality and hyponatremia: Secondary | ICD-10-CM | POA: Insufficient documentation

## 2011-07-26 DIAGNOSIS — E119 Type 2 diabetes mellitus without complications: Secondary | ICD-10-CM | POA: Insufficient documentation

## 2011-07-26 DIAGNOSIS — R110 Nausea: Secondary | ICD-10-CM

## 2011-07-26 DIAGNOSIS — F209 Schizophrenia, unspecified: Secondary | ICD-10-CM | POA: Insufficient documentation

## 2011-07-26 DIAGNOSIS — R197 Diarrhea, unspecified: Secondary | ICD-10-CM | POA: Insufficient documentation

## 2011-07-26 DIAGNOSIS — E2749 Other adrenocortical insufficiency: Secondary | ICD-10-CM

## 2011-07-26 DIAGNOSIS — K219 Gastro-esophageal reflux disease without esophagitis: Secondary | ICD-10-CM | POA: Insufficient documentation

## 2011-07-26 DIAGNOSIS — G4733 Obstructive sleep apnea (adult) (pediatric): Secondary | ICD-10-CM | POA: Insufficient documentation

## 2011-07-26 DIAGNOSIS — Z794 Long term (current) use of insulin: Secondary | ICD-10-CM | POA: Insufficient documentation

## 2011-07-26 DIAGNOSIS — F4310 Post-traumatic stress disorder, unspecified: Secondary | ICD-10-CM | POA: Insufficient documentation

## 2011-07-26 LAB — POCT I-STAT, CHEM 8
Calcium, Ion: 1.18 mmol/L (ref 1.12–1.32)
HCT: 42 % (ref 39.0–52.0)
Hemoglobin: 14.3 g/dL (ref 13.0–17.0)
TCO2: 27 mmol/L (ref 0–100)

## 2011-07-26 LAB — DIFFERENTIAL
Basophils Absolute: 0 10*3/uL (ref 0.0–0.1)
Basophils Relative: 0 % (ref 0–1)
Eosinophils Absolute: 0.2 10*3/uL (ref 0.0–0.7)
Eosinophils Relative: 3 % (ref 0–5)

## 2011-07-26 LAB — CBC
MCH: 29.2 pg (ref 26.0–34.0)
MCV: 82 fL (ref 78.0–100.0)
Platelets: 332 10*3/uL (ref 150–400)
RDW: 13.2 % (ref 11.5–15.5)

## 2011-07-26 LAB — GLUCOSE, CAPILLARY: Glucose-Capillary: 139 mg/dL — ABNORMAL HIGH (ref 70–99)

## 2011-07-26 MED ORDER — SODIUM CHLORIDE 0.9 % IV BOLUS (SEPSIS)
1000.0000 mL | Freq: Once | INTRAVENOUS | Status: AC
  Administered 2011-07-26: 1000 mL via INTRAVENOUS

## 2011-07-26 MED ORDER — ONDANSETRON 4 MG PO TBDP: 4.0000 mg | ORAL_TABLET | Freq: Three times a day (TID) | ORAL | Status: AC | PRN

## 2011-07-26 NOTE — Discharge Instructions (Signed)
Clear Liquid Diet The clear liquid dietconsists of foods that are liquid or will become liquid at room temperature.You should be able to see through the liquid and beverages. Examples of foods allowed on a clear liquid diet include fruit juice, broth or bouillon, gelatin, or frozen ice pops. The purpose of this diet is to provide necessary fluid, electrolytes such as sodium and potassium, and energy to keep the body functioning during times when you are not able to consume a regular diet.A clear liquid diet should not be continued for long periods of time as it is not nutritionally adequate.  REASONS FOR USING A CLEAR LIQUID DIET  In sudden onset (acute) conditions for a patient before or after surgery.   As the first step in oral feeding.   For fluid and electrolyte replacement in diarrheal diseases.   As a diet before certain medical tests are performed.  ADEQUACY The clear liquid diet is adequate only in ascorbic acid, according to the Recommended Dietary Allowances of the National Research Council. CHOOSING FOODS Breads and Starches  Allowed:  None are allowed.   Avoid: All are avoided.  Vegetables  Allowed:  Strained tomato or vegetable juice.   Avoid: Any others.  Fruit  Allowed:  Strained fruit juices and fruit drinks. Include 1 serving of citrus or vitamin C-enriched fruit juice daily.   Avoid: Any others.  Meat and Meat Substitutes  Allowed:  None are allowed.   Avoid: All are avoided.  Milk  Allowed:  None are allowed.   Avoid: All are avoided.  Soups and Combination Foods  Allowed:  Clear bouillon, broth, or strained broth-based soups.   Avoid: Any others.  Desserts and Sweets  Allowed:  Sugar, honey. High protein gelatin. Flavored gelatin, ices, or frozen ice pops that do not contain milk.   Avoid: Any others.  Fats and Oils  Allowed:  None are allowed.   Avoid: All are avoided.  Beverages  Allowed: Cereal beverages, coffee (regular or  decaffeinated), tea, or soda at the discretion of your caregiver.   Avoid: Any others.  Condiments  Allowed:  Iodized salt.   Avoid: Any others, including pepper.  Supplements  Allowed:  Liquid nutrition beverages.   Avoid: Any others that contain lactose or fiber.  SAMPLE MEAL PLAN Breakfast  4 oz (120 mL) strained orange juice.    to 1 cup (125 to 250 mL) gelatin (plain or fortified).   1 cup (250 mL) beverage (coffee or tea).   Sugar, if desired.  Midmorning Snack   cup (125 mL) gelatin (plain or fortified).  Lunch  1 cup (250 mL) broth or consomm.   4 oz (120 mL) strained grapefruit juice.    cup (125 mL) gelatin (plain or fortified).   1 cup (250 mL) beverage (coffee or tea).   Sugar, if desired.  Midafternoon Snack   cup (125 mL) fruit ice.    cup (125 mL) strained fruit juice.  Dinner  1 cup (250 mL) broth or consomm.    cup (125 mL) cranberry juice.    cup (125 mL) flavored gelatin (plain or fortified).   1 cup (250 mL) beverage (coffee or tea).   Sugar, if desired.  Evening Snack  4 oz (120 mL) strained apple juice (vitamin C-fortified).    cup (125 mL) flavored gelatin (plain or fortified).  Document Released: 01/31/2005 Document Revised: 01/20/2011 Document Reviewed: 04/30/2010 [employer] Patient Information 2012 [employer]. 

## 2011-07-26 NOTE — ED Provider Notes (Signed)
History     CSN: [id number]  Arrival date & time 07/26/11  1735   First MD Initiated Contact with Patient 07/26/11 1931      Chief Complaint  Patient presents with  . Weakness     HPI Patient with 3 or four-day history of occasional vomiting.  Began with some diarrhea today which concerned him.  Has history of Addison's disease.  Patient also complaining of muscle cramps but denies fever or chills at this time. Past Medical History  Diagnosis Date  . Osteomyelitis of toe of left foot 10/24/10    fifth metatarsal base  . Diabetic toe ulcer 10/24/10    Deep abscess   . Diabetes mellitus   . GERD (gastroesophageal reflux disease)   . Obstructive sleep apnea   . PTSD (post-traumatic stress disorder)   . Depression   . Anxiety   . Schizophrenia   . History of migraine headaches   . Glaucoma     Past Surgical History  Procedure Date  . Incise and drain abcess 10/20/10    left foot  . Bone resection 10/19/10    resection of the fifth metatarsal base w/ VAC application  . Foot tendon transfer 10/19/10    left ankle peroneus brevis to peroneus longus     No family history on file.  History  Substance Use Topics  . Smoking status: Never Smoker   . Smokeless tobacco: Not on file  . Alcohol Use: No      Review of Systems  All other systems reviewed and are negative.    Allergies  Aspirin  Home Medications   Current Outpatient Rx  Name Route Sig Dispense Refill  . ASPIRIN 81 MG PO TABS Oral Take 81 mg by mouth every evening.      [name] DIPHENHYDRAMINE HCL 25 MG PO CAPS Oral Take 25 mg by mouth at bedtime as needed. For sleep    . FLUDROCORTISONE ACETATE 0.1 MG PO TABS Oral Take 0.1 mg by mouth every Monday, Wednesday, and Friday.    [name] FLUOXETINE HCL 20 MG PO CAPS Oral Take 60 mg by mouth daily.    [name] FLUTICASONE PROPIONATE 50 MCG/ACT NA SUSP Nasal Place 2 sprays into the nose daily as needed. For allergies    . GABAPENTIN 100 MG PO CAPS Oral Take 1 capsule (100 mg total) by  mouth 3 (three) times daily. 90 capsule 1  . HYDROCODONE-ACETAMINOPHEN 5-325 MG PO TABS Oral Take 1 tablet by mouth every 6 (six) hours as needed. For pain    . INSULIN ASPART 100 UNIT/ML [place] SOLN Subcutaneous Inject 1-10 Units into the skin 3 (three) times daily with meals. Sliding scale    . INSULIN GLARGINE 100 UNIT/ML [place] SOLN Subcutaneous Inject 25 Units into the skin at bedtime.    . OMEPRAZOLE 40 MG PO CPDR Oral Take 40 mg by mouth daily.    [name] ONDANSETRON 4 MG PO TBDP Oral Take 1 tablet (4 mg total) by mouth every 8 (eight) hours as needed for nausea. 20 tablet 0    BP 157/88  Pulse 85  Temp 98.3 F (36.8 C) (Oral)  Resp 10  SpO2 98%  Physical Exam  Nursing note and vitals reviewed. Constitutional: He is oriented to person, place, and time. He appears well-developed and well-nourished. No distress.  HENT:  Head: Normocephalic and atraumatic.  Eyes: Pupils are equal, round, and reactive to light.  Neck: Normal range of motion.  Cardiovascular: Normal rate and intact distal pulses.  Pulmonary/Chest: No respiratory distress.  Abdominal: Normal appearance. He exhibits no distension.  Musculoskeletal:       Feet:  Neurological: He is alert and oriented to person, place, and time. No cranial nerve deficit.  Skin: Skin is warm and dry. No rash noted.  Psychiatric: He has a normal mood and affect. His behavior is normal.    ED Course  Procedures (including critical care time)  Labs Reviewed  CBC - Abnormal; Notable for the following:    Hemoglobin 12.8 (*)     HCT 35.9 (*)     All other components within normal limits  GLUCOSE, CAPILLARY - Abnormal; Notable for the following:    Glucose-Capillary 139 (*)     All other components within normal limits  POCT I-STAT, CHEM 8 - Abnormal; Notable for the following:    Sodium 132 (*)     Chloride 92 (*)     Glucose, Bld 135 (*)     All other components within normal limits  DIFFERENTIAL  LAB REPORT - SCANNED   No results  found.   1. Hyponatremia   2. Diarrhea       MDM  I obtained a medicine consult while the patient was in the emergency department.  After evaluation it was determined the patient could go home after receiving IV fluids with instructions for close followup with his PCP.       Nelia Shi, MD 07/30/11 [phone]

## 2011-07-26 NOTE — ED Notes (Signed)
Pt. Having weakness and nausea for 4 days, Began with diarrhea today, Feels like his sodium levels are low, also having muscle cramping

## 2011-07-26 NOTE — H&P (Signed)
PCP:   Ron Parker, MD, MD   Chief Complaint: Diarrhea and nausea. Consultation for emergency room physician Dr. Radford Pax concerning disposition.   HPI: [name] is an 56 y.o. male with multiple medical problem including diabetes, severe anxiety, post traumatic stress disorder, schizophrenia, osteomyelitis with MRSA, and recently diagnosed with Addison's disease, presents to the emergency room with 1 day history of diarrhea and nausea. He denied any abdominal pain, shortness of breath, lightheadedness, black stool bloody stool. He was started on Florinef and currently his systolic blood pressure is 160 with heart rate of 86. Further evaluation with serology was unremarkable including normal BUN creatinine, white count and hemoglobin, potassium, and blood glucose. Because of multiple medical problems, hospitalist was asked to evaluate for potential discharge on admission.  Rewiew of Systems:  The patient denies anorexia, fever, weight loss,, vision loss, decreased hearing, hoarseness, chest pain, syncope, dyspnea on exertion, peripheral edema, balance deficits, hemoptysis, abdominal pain, melena, hematochezia, severe indigestion/heartburn, hematuria, incontinence, genital sores, muscle weakness, suspicious skin lesions, transient blindness, difficulty walking, depression, unusual weight change, abnormal bleeding, enlarged lymph nodes, angioedema, and breast masses.    Past Medical History  Diagnosis Date  . Osteomyelitis of toe of left foot 10/24/10    fifth metatarsal base  . Diabetic toe ulcer 10/24/10    Deep abscess   . Diabetes mellitus   . GERD (gastroesophageal reflux disease)   . Obstructive sleep apnea   . PTSD (post-traumatic stress disorder)   . Depression   . Anxiety   . Schizophrenia   . History of migraine headaches   . Glaucoma     Past Surgical History  Procedure Date  . Incise and drain abcess 10/20/10    left foot  . Bone resection 10/19/10    resection of  the fifth metatarsal base w/ VAC application  . Foot tendon transfer 10/19/10    left ankle peroneus brevis to peroneus longus     Medications:  HOME MEDS: Prior to Admission medications   Medication Sig Start Date End Date Taking? Authorizing Provider  aspirin 81 MG tablet Take 81 mg by mouth every evening.     Yes Historical Provider, MD  diphenhydrAMINE (BENADRYL) 25 mg capsule Take 25 mg by mouth at bedtime as needed. For sleep   Yes Historical Provider, MD  fludrocortisone (FLORINEF) 0.1 MG tablet Take 0.1 mg by mouth every Monday, Wednesday, and Friday.   Yes Historical Provider, MD  FLUoxetine (PROZAC) 20 MG capsule Take 60 mg by mouth daily.   Yes Historical Provider, MD  fluticasone (FLONASE) 50 MCG/ACT nasal spray Place 2 sprays into the nose daily as needed. For allergies   Yes Historical Provider, MD  gabapentin (NEURONTIN) 100 MG capsule Take 1 capsule (100 mg total) by mouth 3 (three) times daily. 03/18/11 03/17/12 Yes Osvaldo Shipper, MD  HYDROcodone-acetaminophen (NORCO) 5-325 MG per tablet Take 1 tablet by mouth every 6 (six) hours as needed. For pain   Yes Historical Provider, MD  insulin aspart (NOVOLOG) 100 UNIT/ML injection Inject 1-10 Units into the skin 3 (three) times daily with meals. Sliding scale 03/18/11 03/17/12 Yes Osvaldo Shipper, MD  insulin glargine (LANTUS) 100 UNIT/ML injection Inject 25 Units into the skin at bedtime. 03/18/11  Yes Osvaldo Shipper, MD  omeprazole (PRILOSEC) 40 MG capsule Take 40 mg by mouth daily.   Yes Historical Provider, MD     Allergies:  Allergies  Allergen Reactions  . Aspirin Nausea And Vomiting    High doses only  Social History:   reports that he has never smoked. He does not have any smokeless tobacco history on file. He reports that he does not drink alcohol or use illicit drugs.  Family History: No family history on file.   Physical Exam: Filed Vitals:   07/26/11 1746 07/26/11 1906  BP: 137/75 128/78  Pulse: 86 87  Temp:  98.5 F (36.9 C) 98.3 F (36.8 C)  TempSrc: Oral Oral  Resp: 16 19  SpO2: 97% 99%   Blood pressure 128/78, pulse 87, temperature 98.3 F (36.8 C), temperature source Oral, resp. rate 19, SpO2 99.00%.  GEN:  Pleasant person lying in the stretcher in no acute distress; cooperative with exam PSYCH:  alert and oriented x4; does not appear anxious or depressed; affect is appropriate. HEENT: Mucous membranes pink and anicteric; PERRLA; EOM intact; no cervical lymphadenopathy nor thyromegaly or carotid bruit; no JVD; Breasts:: Not examined CHEST WALL: No tenderness CHEST: Normal respiration, clear to auscultation bilaterally HEART: Regular rate and rhythm; no murmurs rubs or gallops BACK: No kyphosis or scoliosis; no CVA tenderness ABDOMEN: Obese, soft non-tender; no masses, no organomegaly, normal abdominal bowel sounds; no pannus; no intertriginous candida. Rectal Exam: Not done EXTREMITIES: Nontender, no edema, no evidence of active infection. Genitalia: not examined PULSES: 2+ and symmetric SKIN: Normal hydration no rash or ulceration CNS: Cranial nerves 2-12 grossly intact no focal lateralizing neurologic deficit   Labs & Imaging Results for orders placed during the hospital encounter of 07/26/11 (from the past 48 hour(s))  GLUCOSE, CAPILLARY     Status: Abnormal   Collection Time   07/26/11  5:52 PM      Component Value Range Comment   Glucose-Capillary 139 (*) 70 - 99 (mg/dL)   CBC     Status: Abnormal   Collection Time   07/26/11  5:56 PM      Component Value Range Comment   WBC 7.5  4.0 - 10.5 (K/uL)    RBC 4.38  4.22 - 5.81 (MIL/uL)    Hemoglobin 12.8 (*) 13.0 - 17.0 (g/dL)    HCT 16.1 (*) 09.6 - 52.0 (%)    MCV 82.0  78.0 - 100.0 (fL)    MCH 29.2  26.0 - 34.0 (pg)    MCHC 35.7  30.0 - 36.0 (g/dL)    RDW 04.5  40.9 - 81.1 (%)    Platelets 332  150 - 400 (K/uL)   DIFFERENTIAL     Status: Normal   Collection Time   07/26/11  5:56 PM      Component Value Range Comment    Neutrophils Relative 63  43 - 77 (%)    Neutro Abs 4.7  1.7 - 7.7 (K/uL)    Lymphocytes Relative 28  12 - 46 (%)    Lymphs Abs 2.1  0.7 - 4.0 (K/uL)    Monocytes Relative 7  3 - 12 (%)    Monocytes Absolute 0.5  0.1 - 1.0 (K/uL)    Eosinophils Relative 3  0 - 5 (%)    Eosinophils Absolute 0.2  0.0 - 0.7 (K/uL)    Basophils Relative 0  0 - 1 (%)    Basophils Absolute 0.0  0.0 - 0.1 (K/uL)   POCT I-STAT, CHEM 8     Status: Abnormal   Collection Time   07/26/11  6:15 PM      Component Value Range Comment   Sodium 132 (*) 135 - 145 (mEq/L)    Potassium 4.0  3.5 -  5.1 (mEq/L)    Chloride 92 (*) 96 - 112 (mEq/L)    BUN 10  6 - 23 (mg/dL)    Creatinine, Ser 1.47  0.50 - 1.35 (mg/dL)    Glucose, Bld 829 (*) 70 - 99 (mg/dL)    Calcium, Ion 5.62  1.12 - 1.32 (mmol/L)    TCO2 27  0 - 100 (mmol/L)    Hemoglobin 14.3  13.0 - 17.0 (g/dL)    HCT 13.0  86.5 - 78.4 (%)    No results found.    Assessment: This gentleman has nausea and diarrhea but only for one day. He was recently diagnosed with Addison's disease and is currently on Florinef. His blood pressure is adequate and his heart rate is good at 86. His multiple medical problem including history of osteomyelitis, diabetes, anxiety, are all  chronic problems. I would recommend giving him 1 L of intravenous fluid, presents to for C. difficile and routine study. If he continued to feel well as he is doing, he can be safely discharged to followup with Dr. Della Goo later this week. Thank you for this consultation. As always, if he feels worse or having new symptoms, he should always return to the emergency room. He is very agreeable with this plan, and was not expecting to be admitted today in the first place.      Other plans as per orders.    Paislee Szatkowski 07/26/2011, 9:55 PM

## 2011-09-22 ENCOUNTER — Encounter (HOSPITAL_COMMUNITY): Payer: Self-pay | Admitting: Adult Health

## 2011-09-22 ENCOUNTER — Inpatient Hospital Stay (HOSPITAL_COMMUNITY)
Admission: EM | Admit: 2011-09-22 | Discharge: 2011-09-24 | DRG: 644 | Disposition: A | Discharge status: Home / Self Care | Payer: Medicaid Other | Attending: Internal Medicine | Admitting: Internal Medicine

## 2011-09-22 DIAGNOSIS — F329 Major depressive disorder, single episode, unspecified: Secondary | ICD-10-CM | POA: Diagnosis present

## 2011-09-22 DIAGNOSIS — R112 Nausea with vomiting, unspecified: Secondary | ICD-10-CM

## 2011-09-22 DIAGNOSIS — F411 Generalized anxiety disorder: Secondary | ICD-10-CM | POA: Diagnosis present

## 2011-09-22 DIAGNOSIS — E2749 Other adrenocortical insufficiency: Principal | ICD-10-CM | POA: Diagnosis present

## 2011-09-22 DIAGNOSIS — E1142 Type 2 diabetes mellitus with diabetic polyneuropathy: Secondary | ICD-10-CM | POA: Diagnosis present

## 2011-09-22 DIAGNOSIS — G4733 Obstructive sleep apnea (adult) (pediatric): Secondary | ICD-10-CM | POA: Diagnosis present

## 2011-09-22 DIAGNOSIS — H409 Unspecified glaucoma: Secondary | ICD-10-CM | POA: Diagnosis present

## 2011-09-22 DIAGNOSIS — L97529 Non-pressure chronic ulcer of other part of left foot with unspecified severity: Secondary | ICD-10-CM | POA: Diagnosis present

## 2011-09-22 DIAGNOSIS — E11621 Type 2 diabetes mellitus with foot ulcer: Secondary | ICD-10-CM

## 2011-09-22 DIAGNOSIS — E271 Primary adrenocortical insufficiency: Secondary | ICD-10-CM | POA: Diagnosis present

## 2011-09-22 DIAGNOSIS — Z794 Long term (current) use of insulin: Secondary | ICD-10-CM

## 2011-09-22 DIAGNOSIS — Z79899 Other long term (current) drug therapy: Secondary | ICD-10-CM

## 2011-09-22 DIAGNOSIS — R531 Weakness: Secondary | ICD-10-CM

## 2011-09-22 DIAGNOSIS — E1169 Type 2 diabetes mellitus with other specified complication: Secondary | ICD-10-CM | POA: Diagnosis present

## 2011-09-22 DIAGNOSIS — F3289 Other specified depressive episodes: Secondary | ICD-10-CM | POA: Diagnosis present

## 2011-09-22 DIAGNOSIS — F209 Schizophrenia, unspecified: Secondary | ICD-10-CM | POA: Diagnosis present

## 2011-09-22 DIAGNOSIS — Z7982 Long term (current) use of aspirin: Secondary | ICD-10-CM

## 2011-09-22 DIAGNOSIS — E1149 Type 2 diabetes mellitus with other diabetic neurological complication: Secondary | ICD-10-CM | POA: Diagnosis present

## 2011-09-22 DIAGNOSIS — L97509 Non-pressure chronic ulcer of other part of unspecified foot with unspecified severity: Secondary | ICD-10-CM

## 2011-09-22 DIAGNOSIS — M9080 Osteopathy in diseases classified elsewhere, unspecified site: Secondary | ICD-10-CM | POA: Diagnosis present

## 2011-09-22 DIAGNOSIS — K219 Gastro-esophageal reflux disease without esophagitis: Secondary | ICD-10-CM | POA: Diagnosis present

## 2011-09-22 DIAGNOSIS — F4310 Post-traumatic stress disorder, unspecified: Secondary | ICD-10-CM | POA: Diagnosis present

## 2011-09-22 DIAGNOSIS — L02612 Cutaneous abscess of left foot: Secondary | ICD-10-CM

## 2011-09-22 DIAGNOSIS — A4901 Methicillin susceptible Staphylococcus aureus infection, unspecified site: Secondary | ICD-10-CM

## 2011-09-22 DIAGNOSIS — E871 Hypo-osmolality and hyponatremia: Secondary | ICD-10-CM

## 2011-09-22 DIAGNOSIS — M869 Osteomyelitis, unspecified: Secondary | ICD-10-CM

## 2011-09-22 DIAGNOSIS — E1140 Type 2 diabetes mellitus with diabetic neuropathy, unspecified: Secondary | ICD-10-CM | POA: Diagnosis present

## 2011-09-22 DIAGNOSIS — R51 Headache: Secondary | ICD-10-CM

## 2011-09-22 HISTORY — DX: Primary adrenocortical insufficiency: E27.1

## 2011-09-22 LAB — CBC WITH DIFFERENTIAL/PLATELET
Basophils Relative: 0 % (ref 0–1)
Eosinophils Absolute: 0 10*3/uL (ref 0.0–0.7)
Eosinophils Relative: 0 % (ref 0–5)
HCT: 33.8 % — ABNORMAL LOW (ref 39.0–52.0)
Hemoglobin: 12.1 g/dL — ABNORMAL LOW (ref 13.0–17.0)
MCH: 29.2 pg (ref 26.0–34.0)
MCHC: 35.8 g/dL (ref 30.0–36.0)
MCV: 81.4 fL (ref 78.0–100.0)
Monocytes Absolute: 1.1 10*3/uL — ABNORMAL HIGH (ref 0.1–1.0)
Monocytes Relative: 6 % (ref 3–12)

## 2011-09-22 LAB — BASIC METABOLIC PANEL
BUN: 8 mg/dL (ref 6–23)
Creatinine, Ser: 0.65 mg/dL (ref 0.50–1.35)
GFR calc Af Amer: >90 mL/min (ref >90)
GFR calc non Af Amer: >90 mL/min (ref >90)

## 2011-09-22 LAB — URINALYSIS, ROUTINE W REFLEX MICROSCOPIC
Bilirubin Urine: NEGATIVE
Hgb urine dipstick: NEGATIVE
Protein, ur: NEGATIVE mg/dL
Urobilinogen, UA: 1 mg/dL (ref 0.0–1.0)

## 2011-09-22 MED ORDER — SODIUM CHLORIDE 0.9 % IV BOLUS (SEPSIS)
1000.0000 mL | Freq: Once | INTRAVENOUS | Status: AC
  Administered 2011-09-22: 1000 mL via INTRAVENOUS

## 2011-09-22 MED ORDER — LORAZEPAM 1 MG PO TABS
1.0000 mg | ORAL_TABLET | Freq: Once | ORAL | Status: AC
  Administered 2011-09-23: 1 mg via ORAL
  Filled 2011-09-22: qty 1

## 2011-09-22 MED ORDER — ACETAMINOPHEN 325 MG PO TABS
650.0000 mg | ORAL_TABLET | Freq: Once | ORAL | Status: AC
  Administered 2011-09-22: 650 mg via ORAL

## 2011-09-22 MED ORDER — HYDROCORTISONE SOD SUCCINATE 100 MG IJ SOLR
100.0000 mg | Freq: Once | INTRAMUSCULAR | Status: AC
  Administered 2011-09-22: 100 mg via INTRAVENOUS
  Filled 2011-09-22: qty 2

## 2011-09-22 MED ORDER — ONDANSETRON HCL 4 MG/2ML IJ SOLN
4.0000 mg | Freq: Once | INTRAMUSCULAR | Status: AC
  Administered 2011-09-22: 4 mg via INTRAVENOUS
  Filled 2011-09-22: qty 2

## 2011-09-22 MED ORDER — SODIUM CHLORIDE 0.9 % IV SOLN
Freq: Once | INTRAVENOUS | Status: AC
  Administered 2011-09-23: 1000 mL via INTRAVENOUS

## 2011-09-22 MED ORDER — ONDANSETRON 4 MG PO TBDP
ORAL_TABLET | ORAL | Status: AC
  Filled 2011-09-22: qty 2

## 2011-09-22 MED ORDER — ACETAMINOPHEN 325 MG PO TABS
ORAL_TABLET | ORAL | Status: AC
  Filled 2011-09-22: qty 2

## 2011-09-22 MED ORDER — ONDANSETRON 4 MG PO TBDP
8.0000 mg | ORAL_TABLET | Freq: Once | ORAL | Status: AC
  Administered 2011-09-22: 8 mg via ORAL

## 2011-09-22 NOTE — ED Notes (Signed)
Patient states that he is adjusting to adisons disease. C/O weakness and fatigue.  Also C/O nausea. States that this has been ongoing. C/O diabetic Neuropathy and pain in his feet. 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  C/O being thin skinned and bruises easily.

## 2011-09-22 NOTE — ED Notes (Signed)
I gave the patient a cup of ice and a diet coke. 

## 2011-09-22 NOTE — ED Notes (Signed)
Recently Dx with Addisons disease, yesterday began to experience nausea, vomiting, diarrhea, feeling lightheaded, muscle cramps and chills. Pt denies pain, c/o nausea. Alert and oriented. Mucus membranes moist.

## 2011-09-22 NOTE — ED Notes (Signed)
Patient states he is unable to do the standing portion.

## 2011-09-22 NOTE — ED Notes (Signed)
Patient is unable to void at present. 

## 2011-09-22 NOTE — ED Provider Notes (Signed)
History     CSN: [id number]  Arrival date & time 09/22/11  1801   First MD Initiated Contact with Patient 09/22/11 2105      Chief Complaint  Patient presents with  . Nausea    (Consider location/radiation/quality/duration/timing/severity/associated sxs/prior treatment) HPI Comments: Diabetic male with history of Addison's disease presenting with one day of nausea, vomiting, dehydration feeling lightheaded with muscle cramps and chills. No fevers. Denies abdominal pain, chest pain or shortness of breath. States compliance with medications. Has a chronic nonhealing wound of the left foot.  The history is provided by the patient.    Past Medical History  Diagnosis Date  . Osteomyelitis of toe of left foot 10/24/10    fifth metatarsal base  . Diabetic toe ulcer 10/24/10    Deep abscess   . Diabetes mellitus   . GERD (gastroesophageal reflux disease)   . Obstructive sleep apnea   . PTSD (post-traumatic stress disorder)   . Depression   . Anxiety   . Schizophrenia   . History of migraine headaches   . Glaucoma   . Addison's disease     Past Surgical History  Procedure Date  . Incise and drain abcess 10/20/10    left foot  . Bone resection 10/19/10    resection of the fifth metatarsal base w/ VAC application  . Foot tendon transfer 10/19/10    left ankle peroneus brevis to peroneus longus     History reviewed. No pertinent family history.  History  Substance Use Topics  . Smoking status: Never Smoker   . Smokeless tobacco: Not on file  . Alcohol Use: No      Review of Systems  Constitutional: Positive for activity change, appetite change and fatigue. Negative for fever.  HENT: Negative for congestion and rhinorrhea.   Eyes: Negative for visual disturbance.  Respiratory: Negative for cough, chest tightness and shortness of breath.   Cardiovascular: Negative for chest pain.  Gastrointestinal: Positive for nausea, vomiting and diarrhea. Negative for abdominal pain.    Genitourinary: Negative for dysuria and hematuria.  Musculoskeletal: Positive for myalgias and arthralgias.  Skin: Positive for wound.  Neurological: Negative for dizziness, weakness and headaches.    Allergies  Aspirin  Home Medications   Current Outpatient Rx  Name Route Sig Dispense Refill  . ASPIRIN 81 MG PO TABS Oral Take 81 mg by mouth every evening.      [name] DIPHENHYDRAMINE HCL 25 MG PO CAPS Oral Take 25 mg by mouth at bedtime as needed. For sleep    . FLUDROCORTISONE ACETATE 0.1 MG PO TABS Oral Take 0.1 mg by mouth 2 (two) times daily. May take third dose if needed for anxiety    . FLUOXETINE HCL 20 MG PO CAPS Oral Take 60 mg by mouth daily.    [name] FLUTICASONE PROPIONATE 50 MCG/ACT NA SUSP Nasal Place 2 sprays into the nose daily as needed. For allergies    . GABAPENTIN 100 MG PO CAPS Oral Take 1 capsule (100 mg total) by mouth 3 (three) times daily. 90 capsule 1  . HYDROCODONE-ACETAMINOPHEN 5-325 MG PO TABS Oral Take 1 tablet by mouth every 6 (six) hours as needed. For pain    . INSULIN ASPART 100 UNIT/ML [place] SOLN Subcutaneous Inject 1-10 Units into the skin 3 (three) times daily with meals. Sliding scale    . INSULIN GLARGINE 100 UNIT/ML [place] SOLN Subcutaneous Inject 25 Units into the skin at bedtime.    . OMEPRAZOLE 40 MG PO CPDR Oral  Take 40 mg by mouth daily.      BP 146/81  Pulse 73  Temp 98.5 F (36.9 C) (Oral)  Resp 16  SpO2 99%  Physical Exam  Constitutional: He appears well-developed and well-nourished. No distress.       Anxious, tearful  HENT:  Head: Normocephalic and atraumatic.  Mouth/Throat: Oropharynx is clear and moist. No oropharyngeal exudate.  Eyes: Conjunctivae are normal. Pupils are equal, round, and reactive to light.  Neck: Normal range of motion. Neck supple.  Cardiovascular: Normal rate, regular rhythm and normal heart sounds.   No murmur heard. Pulmonary/Chest: Effort normal and breath sounds normal.  Abdominal: Soft. There is no tenderness.  There is no rebound and no guarding.  Musculoskeletal: Normal range of motion. He exhibits no edema and no tenderness.       Chronic deformity of left foot with ulceration lateral surface. +2 DP PT pulse. No evidence of cellulitis or abscess  Neurological: He is alert.  Skin: Skin is warm.    ED Course  Procedures (including critical care time)  Labs Reviewed  CBC WITH DIFFERENTIAL - Abnormal; Notable for the following:    WBC 17.7 (*)     RBC 4.15 (*)     Hemoglobin 12.1 (*)     HCT 33.8 (*)     Neutrophils Relative 87 (*)     Neutro Abs 15.3 (*)     Lymphocytes Relative 7 (*)     Monocytes Absolute 1.1 (*)     All other components within normal limits  BASIC METABOLIC PANEL - Abnormal; Notable for the following:    Sodium 122 (*)     Chloride 87 (*)     Glucose, Bld 154 (*)     All other components within normal limits  GLUCOSE, CAPILLARY - Abnormal; Notable for the following:    Glucose-Capillary 127 (*)     All other components within normal limits  URINALYSIS, ROUTINE W REFLEX MICROSCOPIC   No results found.   1. Hyponatremia   2. Nausea and vomiting       MDM  Nausea, vomiting, diarrhea with body aches. History of Addison's disease. Vital stable.  Heart rate 74 Leukocyotosis expected with steroid use. Given IVF, orthostatics. Sodium 122 from baseline of 130. Tolerating PO in the ED. To be evaluated by [facility] for correction of hyponatremia.       Glynn Octave, MD 09/22/11 [phone]

## 2011-09-23 ENCOUNTER — Encounter (HOSPITAL_COMMUNITY): Payer: Self-pay | Admitting: Internal Medicine

## 2011-09-23 ENCOUNTER — Inpatient Hospital Stay (HOSPITAL_COMMUNITY): Payer: Medicaid Other

## 2011-09-23 DIAGNOSIS — E871 Hypo-osmolality and hyponatremia: Secondary | ICD-10-CM

## 2011-09-23 DIAGNOSIS — E1142 Type 2 diabetes mellitus with diabetic polyneuropathy: Secondary | ICD-10-CM

## 2011-09-23 DIAGNOSIS — E11621 Type 2 diabetes mellitus with foot ulcer: Secondary | ICD-10-CM | POA: Diagnosis present

## 2011-09-23 DIAGNOSIS — E1169 Type 2 diabetes mellitus with other specified complication: Secondary | ICD-10-CM

## 2011-09-23 DIAGNOSIS — E1149 Type 2 diabetes mellitus with other diabetic neurological complication: Secondary | ICD-10-CM

## 2011-09-23 DIAGNOSIS — L97529 Non-pressure chronic ulcer of other part of left foot with unspecified severity: Secondary | ICD-10-CM | POA: Diagnosis present

## 2011-09-23 DIAGNOSIS — E2749 Other adrenocortical insufficiency: Principal | ICD-10-CM

## 2011-09-23 DIAGNOSIS — L97509 Non-pressure chronic ulcer of other part of unspecified foot with unspecified severity: Secondary | ICD-10-CM

## 2011-09-23 LAB — CREATININE, URINE, RANDOM: Creatinine, Urine: 17.53 mg/dL

## 2011-09-23 LAB — GLUCOSE, CAPILLARY
Glucose-Capillary: 268 mg/dL — ABNORMAL HIGH (ref 70–99)
Glucose-Capillary: 320 mg/dL — ABNORMAL HIGH (ref 70–99)

## 2011-09-23 LAB — SEDIMENTATION RATE: Sed Rate: 12 mm/hr (ref 0–16)

## 2011-09-23 LAB — BASIC METABOLIC PANEL
Calcium: 8.8 mg/dL (ref 8.4–10.5)
Chloride: 88 mEq/L — ABNORMAL LOW (ref 96–112)
GFR calc Af Amer: >90 mL/min (ref >90)
GFR calc Af Amer: >90 mL/min (ref >90)
GFR calc non Af Amer: >90 mL/min (ref >90)
GFR calc non Af Amer: >90 mL/min (ref >90)
Glucose, Bld: 139 mg/dL — ABNORMAL HIGH (ref 70–99)
Potassium: 3.9 mEq/L (ref 3.5–5.1)
Potassium: 4.4 mEq/L (ref 3.5–5.1)
Sodium: 124 mEq/L — ABNORMAL LOW (ref 135–145)
Sodium: 123 mEq/L — ABNORMAL LOW (ref 135–145)

## 2011-09-23 LAB — COMPREHENSIVE METABOLIC PANEL
ALT: 10 U/L (ref 0–53)
Alkaline Phosphatase: 65 U/L (ref 39–117)
BUN: 8 mg/dL (ref 6–23)
Chloride: 91 mEq/L — ABNORMAL LOW (ref 96–112)
GFR calc Af Amer: >90 mL/min (ref >90)
Glucose, Bld: 253 mg/dL — ABNORMAL HIGH (ref 70–99)
Potassium: 4.1 mEq/L (ref 3.5–5.1)
Sodium: 124 mEq/L — ABNORMAL LOW (ref 135–145)
Total Bilirubin: 0.5 mg/dL (ref 0.3–1.2)

## 2011-09-23 LAB — SODIUM, URINE, RANDOM: Sodium, Ur: 43 mEq/L

## 2011-09-23 LAB — CBC
HCT: 29.8 % — ABNORMAL LOW (ref 39.0–52.0)
MCHC: 36.2 g/dL — ABNORMAL HIGH (ref 30.0–36.0)
MCV: 80.8 fL (ref 78.0–100.0)
Platelets: 316 10*3/uL (ref 150–400)
RDW: 12.9 % (ref 11.5–15.5)
WBC: 12.1 10*3/uL — ABNORMAL HIGH (ref 4.0–10.5)

## 2011-09-23 LAB — HEMOGLOBIN A1C: Mean Plasma Glucose: 174 mg/dL — ABNORMAL HIGH (ref <117)

## 2011-09-23 MED ORDER — HYDROCORTISONE SOD SUCCINATE 100 MG IJ SOLR
50.0000 mg | Freq: Once | INTRAMUSCULAR | Status: AC
  Administered 2011-09-23: 50 mg via INTRAVENOUS
  Filled 2011-09-23: qty 1

## 2011-09-23 MED ORDER — PREDNISONE 10 MG PO TABS
10.0000 mg | ORAL_TABLET | Freq: Every day | ORAL | Status: DC
Start: 2011-09-24 — End: 2011-09-24
  Administered 2011-09-24: 10 mg via ORAL
  Filled 2011-09-23 (×2): qty 1

## 2011-09-23 MED ORDER — HYDROCORTISONE SOD SUCCINATE 100 MG PF FOR IT USE: 50.0000 mg | Freq: Once | INTRAMUSCULAR | Status: DC

## 2011-09-23 MED ORDER — HYDROCODONE-ACETAMINOPHEN 5-325 MG PO TABS
1.0000 | ORAL_TABLET | ORAL | Status: DC | PRN
  Administered 2011-09-23 – 2011-09-24 (×4): 2 via ORAL
  Filled 2011-09-23 (×4): qty 2

## 2011-09-23 MED ORDER — INSULIN GLARGINE 100 UNIT/ML ~~LOC~~ SOLN: 25.0000 [IU] | Freq: Every day | SUBCUTANEOUS | Status: DC

## 2011-09-23 MED ORDER — GABAPENTIN 100 MG PO CAPS
100.0000 mg | ORAL_CAPSULE | Freq: Three times a day (TID) | ORAL | Status: DC
  Administered 2011-09-23 – 2011-09-24 (×4): 100 mg via ORAL
  Filled 2011-09-23 (×6): qty 1

## 2011-09-23 MED ORDER — FLUOXETINE HCL 20 MG PO CAPS
60.0000 mg | ORAL_CAPSULE | Freq: Every day | ORAL | Status: DC
  Administered 2011-09-23 – 2011-09-24 (×2): 60 mg via ORAL
  Filled 2011-09-23 (×2): qty 3

## 2011-09-23 MED ORDER — ONDANSETRON HCL 4 MG/2ML IJ SOLN: 4.0000 mg | Freq: Four times a day (QID) | INTRAMUSCULAR | Status: DC | PRN

## 2011-09-23 MED ORDER — INSULIN ASPART 100 UNIT/ML ~~LOC~~ SOLN
0.0000 [IU] | Freq: Every day | SUBCUTANEOUS | Status: DC
  Administered 2011-09-23: 4 [IU] via SUBCUTANEOUS

## 2011-09-23 MED ORDER — INSULIN ASPART 100 UNIT/ML ~~LOC~~ SOLN
0.0000 [IU] | Freq: Three times a day (TID) | SUBCUTANEOUS | Status: DC
  Administered 2011-09-23 (×2): 2 [IU] via SUBCUTANEOUS
  Administered 2011-09-23: 3 [IU] via SUBCUTANEOUS
  Administered 2011-09-24: 2 [IU] via SUBCUTANEOUS
  Administered 2011-09-24: 5 [IU] via SUBCUTANEOUS

## 2011-09-23 MED ORDER — ACETAMINOPHEN 650 MG RE SUPP: 650.0000 mg | Freq: Four times a day (QID) | RECTAL | Status: DC | PRN

## 2011-09-23 MED ORDER — GUAIFENESIN-DM 100-10 MG/5ML PO SYRP
5.0000 mL | ORAL_SOLUTION | ORAL | Status: DC | PRN
  Filled 2011-09-23: qty 5

## 2011-09-23 MED ORDER — BACITRACIN-NEOMYCIN-POLYMYXIN OINTMENT TUBE
TOPICAL_OINTMENT | CUTANEOUS | Status: DC
  Filled 2011-09-23: qty 15

## 2011-09-23 MED ORDER — SODIUM CHLORIDE 0.9 % IV SOLN: 250.0000 mL | INTRAVENOUS | Status: DC | PRN

## 2011-09-23 MED ORDER — ENOXAPARIN SODIUM 40 MG/0.4ML ~~LOC~~ SOLN
40.0000 mg | SUBCUTANEOUS | Status: DC
  Administered 2011-09-23: 40 mg via SUBCUTANEOUS
  Filled 2011-09-23 (×2): qty 0.4

## 2011-09-23 MED ORDER — SODIUM CHLORIDE 0.9 % IV SOLN
250.0000 mL | INTRAVENOUS | Status: DC | PRN
  Administered 2011-09-23: 250 mL via INTRAVENOUS

## 2011-09-23 MED ORDER — SODIUM CHLORIDE 0.9 % IJ SOLN: 3.0000 mL | INTRAMUSCULAR | Status: DC | PRN

## 2011-09-23 MED ORDER — SODIUM CHLORIDE 0.9 % IV SOLN: INTRAVENOUS | Status: DC

## 2011-09-23 MED ORDER — ALUM & MAG HYDROXIDE-SIMETH 200-200-20 MG/5ML PO SUSP: 30.0000 mL | Freq: Four times a day (QID) | ORAL | Status: DC | PRN

## 2011-09-23 MED ORDER — SODIUM CHLORIDE 0.9 % IJ SOLN
3.0000 mL | Freq: Two times a day (BID) | INTRAMUSCULAR | Status: DC
  Administered 2011-09-23 (×3): 3 mL via INTRAVENOUS

## 2011-09-23 MED ORDER — DIPHENHYDRAMINE HCL 25 MG PO CAPS: 25.0000 mg | ORAL_CAPSULE | Freq: Every evening | ORAL | Status: DC | PRN

## 2011-09-23 MED ORDER — FLUDROCORTISONE ACETATE 0.1 MG PO TABS
0.1000 mg | ORAL_TABLET | Freq: Two times a day (BID) | ORAL | Status: DC
  Administered 2011-09-23 – 2011-09-24 (×3): 0.1 mg via ORAL
  Filled 2011-09-23 (×4): qty 1

## 2011-09-23 MED ORDER — DOCUSATE SODIUM 100 MG PO CAPS
100.0000 mg | ORAL_CAPSULE | Freq: Two times a day (BID) | ORAL | Status: DC
  Administered 2011-09-23 – 2011-09-24 (×3): 100 mg via ORAL
  Filled 2011-09-23 (×4): qty 1

## 2011-09-23 MED ORDER — HYDROCODONE-ACETAMINOPHEN 5-325 MG PO TABS: 1.0000 | ORAL_TABLET | Freq: Four times a day (QID) | ORAL | Status: DC | PRN

## 2011-09-23 MED ORDER — ONDANSETRON HCL 4 MG PO TABS
4.0000 mg | ORAL_TABLET | Freq: Four times a day (QID) | ORAL | Status: DC | PRN
  Administered 2011-09-24: 4 mg via ORAL
  Filled 2011-09-23: qty 1

## 2011-09-23 MED ORDER — PANTOPRAZOLE SODIUM 40 MG PO TBEC
40.0000 mg | DELAYED_RELEASE_TABLET | Freq: Every day | ORAL | Status: DC
  Administered 2011-09-23 – 2011-09-24 (×2): 40 mg via ORAL
  Filled 2011-09-23 (×2): qty 1

## 2011-09-23 MED ORDER — ACETAMINOPHEN 325 MG PO TABS: 650.0000 mg | ORAL_TABLET | Freq: Four times a day (QID) | ORAL | Status: DC | PRN

## 2011-09-23 MED ORDER — FLUTICASONE PROPIONATE 50 MCG/ACT NA SUSP
2.0000 | Freq: Every day | NASAL | Status: DC
Start: 2011-09-23 — End: 2011-09-24
  Administered 2011-09-23 – 2011-09-24 (×2): 2 via NASAL
  Filled 2011-09-23: qty 16

## 2011-09-23 MED ORDER — ALBUTEROL SULFATE (5 MG/ML) 0.5% IN NEBU: 2.5000 mg | INHALATION_SOLUTION | RESPIRATORY_TRACT | Status: DC | PRN

## 2011-09-23 MED ORDER — ASPIRIN 81 MG PO TABS: 81.0000 mg | ORAL_TABLET | Freq: Every evening | ORAL | Status: DC

## 2011-09-23 MED ORDER — INSULIN GLARGINE 100 UNIT/ML ~~LOC~~ SOLN
30.0000 [IU] | Freq: Every day | SUBCUTANEOUS | Status: DC
  Administered 2011-09-23: 30 [IU] via SUBCUTANEOUS

## 2011-09-23 MED ORDER — ASPIRIN 81 MG PO CHEW
81.0000 mg | CHEWABLE_TABLET | Freq: Every evening | ORAL | Status: DC
  Administered 2011-09-23: 81 mg via ORAL
  Filled 2011-09-23 (×2): qty 1

## 2011-09-23 NOTE — Evaluation (Signed)
Physical Therapy Evaluation Patient Details Name: [name] MRN: [id number] DOB: 04-Jan-1956 Today's Date: 09/23/2011 Time: 8295-6213 PT Time Calculation (min): 20 min  PT Assessment / Plan / Recommendation Clinical Impression  Patient is a 56 yo male admitted with N/V.  Mobility at baseline - patient able to don LLE AFO independently.  Ambulated 400' with RW.  Has 24 hour assist at home.  No acute PT needs identified - PT will sign off.  Recommend patient ambulate in hallway with nursing.    PT Assessment  Patent does not need any further PT services    Follow Up Recommendations  No PT follow up;Supervision - Intermittent    Barriers to Discharge        Equipment Recommendations  None recommended by PT    Recommendations for Other Services     Frequency      Precautions / Restrictions Precautions Precautions: Fall Restrictions Weight Bearing Restrictions: No         Mobility  Bed Mobility Bed Mobility: Supine to Sit;Sitting - Scoot to Edge of Bed Supine to Sit: 7: Independent;HOB flat Sitting - Scoot to Edge of Bed: 7: Independent Details for Bed Mobility Assistance: No cues or assist needed Transfers Transfers: Sit to Stand;Stand to Sit Sit to Stand: 5: Supervision;With upper extremity assist;From bed Stand to Sit: 6: Modified independent (Device/Increase time);With upper extremity assist;To bed Details for Transfer Assistance: Cues for safety Ambulation/Gait Ambulation/Gait Assistance: 5: Supervision Ambulation Distance (Feet): 400 Feet Assistive device: Rolling walker Ambulation/Gait Assistance Details: Cues to stay close to RW - tends to push RW ahead of him. Gait Pattern: Step-through pattern;Decreased stance time - left;Trunk flexed Gait velocity: WFL      PT Goals  N/A  Visit Information  Last PT Received On: 09/23/11 Assistance Needed: +1    Subjective Data  Subjective: "I'm doing fine"  "I think I'm going home tomorrow" Patient Stated  Goal: To return home   Prior Functioning  Home Living Lives With: Family Available Help at Discharge: Family;Available 24 hours/day Home Adaptive Equipment: Walker - rolling;Shower chair with back;Bedside commode/3-in-1 Prior Function Level of Independence: Independent with assistive device(s) Driving: No Communication Communication: No difficulties    Cognition  Overall Cognitive Status: History of cognitive impairments - at baseline Arousal/Alertness: Awake/alert Orientation Level: Appears intact for tasks assessed Behavior During Session: Agitated Cognition - Other Comments: Patient somewhat agitated, stating "It's been one of those days".  Impulsive at times.    Extremity/Trunk Assessment Right Upper Extremity Assessment RUE ROM/Strength/Tone: Within functional levels Left Upper Extremity Assessment LUE ROM/Strength/Tone: Within functional levels Right Lower Extremity Assessment RLE ROM/Strength/Tone: WFL for tasks assessed RLE Sensation: History of peripheral neuropathy Left Lower Extremity Assessment LLE ROM/Strength/Tone: Deficits LLE ROM/Strength/Tone Deficits: Decreased strength - Absent DF/PF, wears anterior AFO LLE Sensation: History of peripheral neuropathy   Balance    End of Session PT - End of Session Equipment Utilized During Treatment: Left ankle foot orthosis Activity Tolerance: Patient tolerated treatment well Patient left: in bed;with call bell/phone within reach (sitting on EOB) Nurse Communication: Mobility status  GP     Vena Austria 09/23/2011, 2:48 PM Durenda Hurt. Renaldo Fiddler, [facility] Pager [phone]

## 2011-09-23 NOTE — Consult Note (Signed)
WOC consult Note Reason for Consult: Consult requested for left outer foot chronic wound. Pt has a foot deformity and this area chronically rubs against his leg splint.  He has had it adjusted once this year and states Medicare will not cover another splint adjustment until next year.  He is followed as an outpatient by ortho service and has been told to use neosporin 3X week to wound.  He is very well informed regarding wound care routine. Wound type: Full thickness unstageable to left outer foot. Pressure Ulcer POA: Yes Measurement:1.5X1.5cm Wound bed: 50% red/50% yellow  Drainage (amount, consistency, odor) Scant tan drainage, no odor Periwound: Erythremia surrounding, pt states this is a chronic appearance. Dressing procedure/placement/frequency: Will continue present plan of care with neosporin and pt can resume follow-up with outpatient physician after discharge.  Will not plan to follow further unless re-consulted.  [address], RN, MSN, [employer]  [phone]

## 2011-09-23 NOTE — Care Management Note (Unsigned)
    Page 1 of 1   09/23/2011     4:44:55 PM   CARE MANAGEMENT NOTE 09/23/2011  Patient:  [name], [name]   Account Number:  [id number]  Date Initiated:  09/23/2011  Documentation initiated by:  Letha Cape  Subjective/Objective Assessment:   dx dm neuropathy  admit- lives with mom.  Patient has had [facility] in the past.     Action/Plan:   pt eval- no pt needs   Anticipated DC Date:  09/26/2011   Anticipated DC Plan:  HOME W HOME HEALTH SERVICES      DC Planning Services  CM consult      Choice offered to / List presented to:             Status of service:  In process, will continue to follow Medicare Important Message given?   (If response is "NO", the following Medicare IM given date fields will be blank) Date Medicare IM given:   Date Additional Medicare IM given:    Discharge Disposition:    Per UR Regulation:  Reviewed for med. necessity/level of care/duration of stay  If discussed at Long Length of Stay Meetings, dates discussed:    Comments:  09/23/11 16:43 Letha Cape RN, BSN [phone] patient lives with mom, patient has had [facility] in the past. physical therapy recs no needs for pt at this time.  NCM will continue to follow for dc needs.

## 2011-09-23 NOTE — Progress Notes (Signed)
[facility] PROGRESS NOTE  [name] AVW:[id number] DOB: 03/25/1955 DOA: 09/22/2011   Assessment/Plan: Patient Active Hospital Problem List: Hyponatremia (03/15/2011)  -most likely secondary to Addison's disease as he is not on steroids. I will go ahead and give him Solu-Cortef and start him on low-dose prednisone. Also check a basic metabolic panel in the morning. He will followup with Dr. Lovell Sheehan as an outpatient .  Addison's disease (09/22/2011) -we'll continue Florinef. We'll start him on prednisone. Further workup for his Addison's disease will be deferred for his primary care Dr.  Diabetes mellitus /Diabetes mellitus with neuropathy (12/08/2010)/Diabetic ulcer of left foot (09/23/2011) -followup with Dr. Algis Liming and the wound care clinic. He relates it looks much better there is less swelling and also it is actually healing. -As he was started on steroids he will require much more insulin that he is on: Increase his Lantus and continue to monitor CBGs.    Code Status: Full code Family Communication: Patient Disposition Plan:  tomorrow home in a.m.     LOS: 1 day   Procedures:  None  Antibiotics:  None   Subjective: No complaints. Wants to go home.  Objective: Filed Vitals:   09/23/11 0845 09/23/11 0914 09/23/11 0917 09/23/11 0919  BP: 115/65 112/54 116/61 105/62  Pulse: 75 68 68 71  Temp: 98.6 F (37 C) 97.6 F (36.4 C)    TempSrc: Oral Oral    Resp: 16 16 16 16   Height:      Weight:      SpO2: 96% 97% 97% 97%    Intake/Output Summary (Last 24 hours) at 09/23/11 1304 Last data filed at 09/23/11 1154  Gross per 24 hour  Intake   1605 ml  Output   3500 ml  Net  -1895 ml   Weight change:   Exam:  General: Alert, awake, oriented x3, in no acute distress.  HEENT: No bruits, no goiter.  Heart: Regular rate and rhythm, without murmurs, rubs, gallops.  Lungs: Good air movement, clear to auscultation bilaterally. Abdomen: Soft, nontender,  nondistended, positive bowel sounds.  Neuro: Grossly intact, nonfocal.   Data Reviewed: Basic Metabolic Panel:  Lab 09/23/11 8295 09/23/11 0628 09/22/11 1829  NA 124* 124* 122*  K 3.9 4.1 --  CL 90* 91* 87*  CO2 27 24 25   GLUCOSE 139* 253* 154*  BUN 9 8 8   CREATININE 0.65 0.61 0.65  CALCIUM 8.8 8.9 9.0  MG -- 1.6 --  PHOS -- 3.5 --   Liver Function Tests:  Lab 09/23/11 0628  AST 11  ALT 10  ALKPHOS 65  BILITOT 0.5  PROT 6.0  ALBUMIN 3.2*   No results found for this basename: LIPASE:5,AMYLASE:5 in the last 168 hours No results found for this basename: AMMONIA:5 in the last 168 hours CBC:  Lab 09/23/11 0628 09/22/11 1829  WBC 12.1* 17.7*  NEUTROABS -- 15.3*  HGB 10.8* 12.1*  HCT 29.8* 33.8*  MCV 80.8 81.4  PLT 316 345   Cardiac Enzymes: No results found for this basename: CKTOTAL:5,CKMB:5,CKMBINDEX:5,TROPONINI:5 in the last 168 hours BNP: No components found with this basename: POCBNP:5 CBG:  Lab 09/23/11 1156 09/23/11 0750 09/23/11 0430 09/22/11 1850  GLUCAP 160* 201* 268* 127*    No results found for this or any previous visit (from the past 240 hour(s)).   Studies: Dg Abd 1 View  09/23/2011  *RADIOLOGY REPORT*  Clinical Data: Abdominal pain.  Nausea.  ABDOMEN - 1 VIEW  Comparison: None.  Findings: Large stool burden  is present almost completely outlining the colon.  Stool ball is present in the right upper quadrant/hepatic flexure.  Stool outlines the transverse, splenic flexure and descending colon.  The bowel gas pattern is nonobstructive.  Gaseous distention of a loop of small bowel in the central abdomen, gaseous distention of a loop of small bowel in the central abdomen is present.  There is no gross plain film evidence of free air. Calcification of the vas deferens is present compatible with diabetes.  IMPRESSION: Large stool burden.  Nonobstructive bowel gas pattern.  Original Report Authenticated By: Andreas Newport, M.D.    Scheduled Meds:    .  sodium chloride   Intravenous Once  . acetaminophen  650 mg Oral Once  . aspirin  81 mg Oral QPM  . docusate sodium  100 mg Oral BID  . enoxaparin (LOVENOX) injection  40 mg Subcutaneous Q24H  . fludrocortisone  0.1 mg Oral BID  . FLUoxetine  60 mg Oral Daily  . fluticasone  2 spray Each Nare Daily  . gabapentin  100 mg Oral TID  . hydrocortisone sodium succinate  100 mg Intravenous Once  . hydrocortisone sodium succinate  50 mg Intrathecal Once  . insulin aspart  0-5 Units Subcutaneous QHS  . insulin aspart  0-9 Units Subcutaneous TID WC  . insulin glargine  25 Units Subcutaneous QHS  . LORazepam  1 mg Oral Once  . ondansetron (ZOFRAN) IV  4 mg Intravenous Once  . ondansetron  8 mg Oral Once  . pantoprazole  40 mg Oral Q1200  . predniSONE  10 mg Oral Q breakfast  . sodium chloride  1,000 mL Intravenous Once  . sodium chloride  3 mL Intravenous Q12H  . DISCONTD: aspirin  81 mg Oral QPM   Continuous Infusions:   Lambert Keto, MD  [facility] Pager [phone]  If 7PM-7AM, please contact night-coverage [email or web address] Password [email or web address] 09/23/2011, 1:04 PM

## 2011-09-23 NOTE — ED Notes (Signed)
Patient states that he is feeling anxious and would like something for it.  MD notified.  New orders per Dr Manus Gunning.

## 2011-09-23 NOTE — Progress Notes (Signed)
[name] [id number] Transfer Data: 09/23/2011 5:16 AM Attending Provider: Altha Harm, MD KGM:[id number] C, MD Code Status: Full   [name] is a 56 y.o. male patient admitted from ED  No acute distress noted.  No c/o shortness of breath, no c/o chest pain.    Blood pressure 140/74, pulse 85, temperature 98.2 F (36.8 C), temperature source Oral, resp. rate 18, height 5\' 10"  (1.778 m), weight 84.1 kg (185 lb 6.5 oz), SpO2 98.00%.   IV Fluids:  IV in place, occlusive dsg intact without redness, IV cath right [facility] with NS @ 80ml/hr.  Allergies:  Aspirin  Past Medical History:   has a past medical history of Osteomyelitis of toe of left foot (10/24/10); Diabetic toe ulcer (10/24/10); Diabetes mellitus; GERD (gastroesophageal reflux disease); PTSD (post-traumatic stress disorder); Depression; Anxiety; Schizophrenia; History of migraine headaches; Glaucoma; Addison's disease; and Obstructive sleep apnea.  Past Surgical History:   has past surgical history that includes Incise and drain abcess (10/20/10); Bone Resection (10/19/10); and Foot tendon transfer (10/19/10).  Social History:   reports that he has never smoked. He does not have any smokeless tobacco history on file. He reports that he does not drink alcohol or use illicit drugs.  Skin: Diabetic ulcer to left foot.  Orientation to room, and floor completed with information packet given to patient/family. Admission INP armband ID verified with patient/family, and in place.   SR up x 2, fall assessment complete, with patient and family able to verbalize understanding of risk associated with falls, and verbalized understanding to call for assistance before getting out of bed.   Call light within reach. Patient able to voice and demonstrate understanding of unit orientation instructions.   Will cont to eval and treat per MD orders.  Eulogio Ditch, RN, [facility] 09/23/2011 5:16 AM

## 2011-09-23 NOTE — H&P (Signed)
PCP:   Ron Parker, MD  ID [facility]   Chief Complaint:   Nausea and vomiting  HPI: [name] is a 56 y.o. male   has a past medical history of Osteomyelitis of toe of left foot (10/24/10); Diabetic toe ulcer (10/24/10); Diabetes mellitus; GERD (gastroesophageal reflux disease); PTSD (post-traumatic stress disorder); Depression; Anxiety; Schizophrenia; History of migraine headaches; Glaucoma; Addison's disease; and Obstructive sleep apnea.   Presented with  Nausea and vomiting for the past 36h. No diarrhea. Some suprapubic pain. He has hx of GERD and recently stopped his Nexium. He also has had some trouble with peripheral neuropathy. He has left foot ulcer that has been improving and he has been seen for it by Orthopedics. He has not been eating well for the past 2-3 days.  Patient is tearful and has been having a lot of trouble with anxiety. No suicidal ideation.  He states he feels dehydrated and thirsty. He has been drinking Gatoraid in AM and PM. 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  Review of Systems:    Pertinent positives include:abdominal pain, nausea, vomiting, headaches,   Constitutional:  No weight loss, night sweats, Fevers, chills, fatigue, weight loss  HEENT:  No Difficulty swallowing,Tooth/dental problems,Sore throat,  No sneezing, itching, ear ache, nasal congestion, post nasal drip,  Cardio-vascular:  No chest pain, Orthopnea, PND, anasarca, dizziness, palpitations.no Bilateral lower extremity swelling  GI:  No heartburn, indigestion,  diarrhea, change in bowel habits, loss of appetite, melena, blood in stool, hematemesis Resp:  no shortness of breath at rest. No dyspnea on exertion, No excess mucus, no productive cough, No non-productive cough, No coughing up of blood.No change in color of mucus.No wheezing. Skin:  no rash or lesions. No jaundice GU:  no  dysuria, change in color of urine, no urgency or frequency. No straining to urinate.  No flank pain.  Musculoskeletal:  No joint pain or no joint swelling. No decreased range of motion. No back pain.  Psych:  No change in mood or affect. No depression or anxiety. No memory loss.  Neuro: no localizing neurological complaints, no tingling, no weakness, no double vision, no gait abnormality, no slurred speech, no confusion  Otherwise ROS are negative except for above, 10 systems were reviewed  Past Medical History: Past Medical History  Diagnosis Date  . Osteomyelitis of toe of left foot 10/24/10    fifth metatarsal base  . Diabetic toe ulcer 10/24/10    Deep abscess   . Diabetes mellitus   . GERD (gastroesophageal reflux disease)   . PTSD (post-traumatic stress disorder)   . Depression   . Anxiety   . Schizophrenia   . History of migraine headaches   . Glaucoma   . Addison's disease   . Obstructive sleep apnea     no CPAP   Past Surgical History  Procedure Date  . Incise and drain abcess 10/20/10    left foot  . Bone resection 10/19/10    resection of the fifth metatarsal base w/ VAC application  . Foot tendon transfer 10/19/10    left ankle peroneus brevis to peroneus longus      Medications: Prior to Admission medications   Medication Sig Start Date End Date Taking? Authorizing Provider  aspirin 81 MG tablet Take 81 mg by mouth every evening.     Yes Historical Provider, MD  diphenhydrAMINE (BENADRYL) 25 mg capsule Take 25 mg  by mouth at bedtime as needed. For sleep   Yes Historical Provider, MD  fludrocortisone (FLORINEF) 0.1 MG tablet Take 0.1 mg by mouth 2 (two) times daily. May take third dose if needed for anxiety   Yes Historical Provider, MD  FLUoxetine (PROZAC) 20 MG capsule Take 60 mg by mouth daily.   Yes Historical Provider, MD  fluticasone (FLONASE) 50 MCG/ACT nasal spray Place 2 sprays into the nose daily as needed. For allergies   Yes Historical Provider, MD    gabapentin (NEURONTIN) 100 MG capsule Take 1 capsule (100 mg total) by mouth 3 (three) times daily. 03/18/11 03/17/12 Yes Osvaldo Shipper, MD  HYDROcodone-acetaminophen (NORCO) 5-325 MG per tablet Take 1 tablet by mouth every 6 (six) hours as needed. For pain   Yes Historical Provider, MD  insulin aspart (NOVOLOG) 100 UNIT/ML injection Inject 1-10 Units into the skin 3 (three) times daily with meals. Sliding scale 03/18/11 03/17/12 Yes Osvaldo Shipper, MD  insulin glargine (LANTUS) 100 UNIT/ML injection Inject 25 Units into the skin at bedtime. 03/18/11  Yes Osvaldo Shipper, MD  omeprazole (PRILOSEC) 40 MG capsule Take 40 mg by mouth daily.   Yes Historical Provider, MD    Allergies:   Allergies  Allergen Reactions  . Aspirin Nausea And Vomiting    High doses only    Social History:  Ambulatory  Independently, he has trouble walking on the brace.  Lives at home   reports that he has never smoked. He does not have any smokeless tobacco history on file. He reports that he does not drink alcohol or use illicit drugs.   Family History: family history includes Breast cancer in his mother; Diabetes type I in his father; and Lung cancer in his father.    Physical Exam: Patient Vitals for the past 24 hrs:  BP Temp Temp src Pulse Resp SpO2  09/22/11 2340 166/84 mmHg - - 82  20  98 %  09/22/11 2132 146/81 mmHg - - 73  - -  09/22/11 2131 151/80 mmHg - - 74  - -  09/22/11 2045 138/81 mmHg 98.5 F (36.9 C) Oral 74  16  99 %  09/22/11 1819 141/84 mmHg 99.2 F (37.3 C) Oral 94  18  98 %    1. General:  in No Acute distress 2. Psychological: Alert and Oriented but tearful 3. Head/ENT:  Dry Mucous Membranes                          Head Non traumatic, neck supple                           Poor Dentition 4. SKIN:  Normal Skin turgor,  Skin clean Dry and intact no rash 5. Heart: Regular rate and rhythm no Murmur, Rub or gallop 6. Lungs: Clear to auscultation bilaterally, no wheezes or crackles   7.  Abdomen: Soft, non-tender, Non distended 8. Lower extremities: no clubbing, cyanosis, or edema 9. Neurologically Grossly intact, moving all 4 extremities equally 10. MSK: Normal range of motion  body mass index is unknown because there is no height or weight on file.   Labs on Admission:   [facility] 09/22/11 1829  NA 122*  K 4.6  CL 87*  CO2 25  GLUCOSE 154*  BUN 8  CREATININE 0.65  CALCIUM 9.0  MG --  PHOS --   No results found for this basename: AST:2,ALT:2,ALKPHOS:2,BILITOT:2,PROT:2,ALBUMIN:2 in the  last 72 hours No results found for this basename: LIPASE:2,AMYLASE:2 in the last 72 hours  Basename 09/22/11 1829  WBC 17.7*  NEUTROABS 15.3*  HGB 12.1*  HCT 33.8*  MCV 81.4  PLT 345   No results found for this basename: CKTOTAL:3,CKMB:3,CKMBINDEX:3,TROPONINI:3 in the last 72 hours No results found for this basename: TSH,T4TOTAL,FREET3,T3FREE,THYROIDAB in the last 72 hours No results found for this basename: VITAMINB12:2,FOLATE:2,FERRITIN:2,TIBC:2,IRON:2,RETICCTPCT:2 in the last 72 hours Lab Results  Component Value Date   HGBA1C 12.2* 03/15/2011    The CrCl is unknown because both a height and weight (above a minimum accepted value) are required for this calculation. ABG    Component Value Date/Time   HCO3 27.4* 10/15/2010 2340   TCO2 27 07/26/2011 1815   O2SAT 58.0 10/15/2010 2340     No results found for this basename: DDIMER     Other results:  UA dilute urine    Cultures:    Component Value Date/Time   SDES TISSUE FOOT LEFT 10/19/2010 1314   SDES TISSUE FOOT LEFT 10/19/2010 1314   SPECREQUEST LEFT 5TH METATARSAL 10/19/2010 1314   SPECREQUEST LEFT 5TH METATARSAL 10/19/2010 1314   CULT  Value: MODERATE STAPHYLOCOCCUS AUREUS Note: RIFAMPIN AND GENTAMICIN SHOULD NOT BE USED AS SINGLE DRUGS FOR TREATMENT OF STAPH INFECTIONS. 10/19/2010 1314   CULT NO ANAEROBES ISOLATED 10/19/2010 1314   REPTSTATUS 10/22/2010 FINAL 10/19/2010 1314   REPTSTATUS 10/24/2010 FINAL 10/19/2010  1314       Radiological Exams on Admission: No results found.  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  Present on Admission:  .Addison's disease - no evidence of hypertension or hemodynamic instability. Continue home medications continue Florinef of note patient did receive IV fluids in emergency department. Will attempt to evaluate his fluid status the orthostatics  .Diabetes mellitus with neuropathy - continue Lantus and sliding scale  .Hyponatremia - in the past fluid restriction has helped. Will obtain urine sodium and TSH level may benefit from renal consult  .Diabetic ulcer of left foot - this seemed to be improving would need to make sure he is following up with orthopedics patient leukocytosis - is unclear in etiology. No evidence of urinary tract infection no pulmonary complaints. We'll continue to follow closely. Obtain sedimentation rate consider orthopedic evaluation while in hospital.    Prophylaxis:  Lovenox, Protonix   Other plan as per orders.  I have spent a total of 55 min on this admission  [name] 09/23/2011, 12:42 AM

## 2011-09-23 NOTE — Consult Note (Deleted)
Wound care follow-up:  Pt had I&D performed by Dr Vernie Ammons of urology service, refer to progress notes for assessment and plan of care.  He has recommended dressing change routine and prefers to leave packing intact until Saturday.  WOC team requested to assess wound and provide further input.  Our team is not available on the upcoming weekend.  Called Dr Vernie Ammons to discuss plan of care;   Packing will remain in place until Sat, when bedside nurse can begin dressing changes as ordered.  WOC will assess wound on Monday as requested.  Cammie Mcgee, RN, MSN, [employer]  [phone]

## 2011-09-24 LAB — BASIC METABOLIC PANEL
BUN: 10 mg/dL (ref 6–23)
CO2: 26 mEq/L (ref 19–32)
Calcium: 8.9 mg/dL (ref 8.4–10.5)
GFR calc non Af Amer: >90 mL/min (ref >90)
Glucose, Bld: 227 mg/dL — ABNORMAL HIGH (ref 70–99)

## 2011-09-24 MED ORDER — PREDNISONE 10 MG PO TABS: 10.0000 mg | ORAL_TABLET | Freq: Every day | ORAL | Status: DC

## 2011-09-24 MED ORDER — INSULIN GLARGINE 100 UNIT/ML ~~LOC~~ SOLN: 30.0000 [IU] | Freq: Every day | SUBCUTANEOUS | Status: DC

## 2011-09-24 NOTE — Progress Notes (Signed)
Floor nurse reached attending and patient will have to follow up with his PCP for a wheelchair as Dr. David Stall will not give order. Genella Rife Tania Ade

## 2011-09-24 NOTE — Discharge Summary (Addendum)
Physician Discharge Summary  [name] ZOX:[id number] DOB: 1955-06-10 DOA: 09/22/2011  PCP: Ron Parker, MD  Admit date: 09/22/2011 Discharge date: 09/24/2011  Discharge Diagnoses:  Principal Problem:  *Hyponatremia Active Problems:  Addison's disease  Diabetes mellitus with neuropathy  Diabetic ulcer of left foot   Discharge Condition: Stable for discharge  Disposition:  Follow-up Information    Follow up with Ron Parker, MD in 1 week. (hospital follow up)    Contact information:   [address] [place] [address] [phone]     here Dr. Lovell Sheehan office we'll check a basic metabolic panel and titrate medications as tolerated       Diet: Low, or diet Wt Readings from Last 3 Encounters:  09/23/11 84.1 kg (185 lb 6.5 oz)  04/19/11 79.379 kg (175 lb)  03/15/11 54.432 kg (120 lb)    History of present illness:  [name] is a 56 y.o. male has a past medical history of Osteomyelitis of toe of left foot (10/24/10); Diabetic toe ulcer (10/24/10); Diabetes mellitus; GERD (gastroesophageal reflux disease); PTSD (post-traumatic stress disorder); Depression; Anxiety; Schizophrenia; History of migraine headaches; Glaucoma; Addison's disease; and Obstructive sleep apnea. Presented with Nausea and vomiting for the past 36h. No diarrhea. Some suprapubic pain. He has hx of GERD and recently stopped his Nexium. He also has had some trouble with peripheral neuropathy. He has left foot ulcer that has been improving and he has been seen for it by Orthopedics. He has not been eating well for the past 2-3 days. Patient is tearful and has been having a lot of trouble with anxiety. No suicidal ideation. He states he feels dehydrated and thirsty. He has been drinking Gatoraid in AM and PM.  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   Hospital Course:  Principal Problem:  *Hyponatremia: He was admitted to the hospital. He relates he has no knowledge of being on steroids. He had no seizure or neurological symptom through out his hospital stay. He is to her on low-dose prednisone. His sodium started to correct, his generalized weakness improved,  so he was discharged in stable condition.  Addison's disease -He was started on steroids in the hospital. He'll follow up with Dr. Lovell Sheehan as an outpatient for further workup was Addison's.  Diabetes mellitus with neuropathy/ Diabetic ulcer of left foot: As he was started on steroids his Lantus to be increased to 35 units to continue sliding scale insulin. Also wound care was consulted that recommended dressing changes with Neosporin and followup with the outpatient clinic wound care center at this outpatient. This followup appointment was made   Discharge Exam: Filed Vitals:   09/24/11 0519  BP: 131/71  Pulse: 62  Temp: 98.2 F (36.8 C)  Resp: 20   Filed Vitals:   09/23/11 1743 09/23/11 2000 09/24/11 0043 09/24/11 0519  BP: 153/75 157/82 121/74 131/71  Pulse: 67 72 82 62  Temp: 98 F (36.7 C) 98.3 F (36.8 C) 98.4 F (36.9 C) 98.2 F (36.8 C)  TempSrc: Oral Oral Oral Oral  Resp: 16 12 18 20   Height:      Weight:      SpO2: 97% 97% 82% 98%   General: Awake alert and oriented times Cardiovascular: Regular rate and rhythm Respiratory: Good air movement clear to auscultation  Discharge Instructions  Discharge Orders    Future Appointments: Provider: Department: Dept Phone: Center:   10/26/2011  3:15 PM Randall Hiss, MD [facility] [id number] [facility]     Future Orders Please Complete By Expires   Diet - low sodium heart healthy      Increase activity slowly        Medication List  As of 09/24/2011  9:49 AM   TAKE these medications         aspirin 81 MG tablet   Take 81 mg by mouth every evening.      diphenhydrAMINE 25 mg capsule   Commonly known as: BENADRYL   Take 25 mg by  mouth at bedtime as needed. For sleep      fludrocortisone 0.1 MG tablet   Commonly known as: FLORINEF   Take 0.1 mg by mouth 2 (two) times daily. May take third dose if needed for anxiety      FLUoxetine 20 MG capsule   Commonly known as: PROZAC   Take 60 mg by mouth daily.      fluticasone 50 MCG/ACT nasal spray   Commonly known as: FLONASE   Place 2 sprays into the nose daily as needed. For allergies      gabapentin 100 MG capsule   Commonly known as: NEURONTIN   Take 1 capsule (100 mg total) by mouth 3 (three) times daily.      HYDROcodone-acetaminophen 5-325 MG per tablet   Commonly known as: NORCO/VICODIN   Take 1 tablet by mouth every 6 (six) hours as needed. For pain      insulin aspart 100 UNIT/ML injection   Commonly known as: novoLOG   Inject 1-10 Units into the skin 3 (three) times daily with meals. Sliding scale      insulin glargine 100 UNIT/ML injection   Commonly known as: LANTUS   Inject 30 Units into the skin at bedtime.      omeprazole 40 MG capsule   Commonly known as: PRILOSEC   Take 40 mg by mouth daily.      predniSONE 10 MG tablet   Commonly known as: DELTASONE   Take 1 tablet (10 mg total) by mouth daily with breakfast.              The results of significant diagnostics from this hospitalization (including imaging, microbiology, ancillary and laboratory) are listed below for reference.    Significant Diagnostic Studies: Dg Abd 1 View  09/23/2011  *RADIOLOGY REPORT*  Clinical Data: Abdominal pain.  Nausea.  ABDOMEN - 1 VIEW  Comparison: None.  Findings: Large stool burden is present almost completely outlining the colon.  Stool ball is present in the right upper quadrant/hepatic flexure.  Stool outlines the transverse, splenic flexure and descending colon.  The bowel gas pattern is nonobstructive.  Gaseous distention of a loop of small bowel in the central abdomen, gaseous distention of a loop of small bowel in the central abdomen is present.   There is no gross plain film evidence of free air. Calcification of the vas deferens is present compatible with diabetes.  IMPRESSION: Large stool burden.  Nonobstructive bowel gas pattern.  Original Report Authenticated By: Andreas Newport, M.D.    Microbiology: No results found for this or any previous visit (from the past 240 hour(s)).   Labs: Basic Metabolic Panel:  Lab 09/24/11 6578 09/23/11 1953 09/23/11 1111 09/23/11 0628 09/22/11 1829  NA 124* 123* 124* 124* 122*  K 3.9 4.4 -- -- --  CL 89* 88* 90* 91* 87*  CO2 26 27 27 24 25   GLUCOSE  227* 330* 139* 253* 154*  BUN 10 9 9 8 8   CREATININE 0.56 0.71 0.65 0.61 0.65  CALCIUM 8.9 8.9 8.8 8.9 9.0  MG -- -- -- 1.6 --  PHOS -- -- -- 3.5 --   Liver Function Tests:  Lab 09/23/11 0628  AST 11  ALT 10  ALKPHOS 65  BILITOT 0.5  PROT 6.0  ALBUMIN 3.2*   No results found for this basename: LIPASE:5,AMYLASE:5 in the last 168 hours No results found for this basename: AMMONIA:5 in the last 168 hours CBC:  Lab 09/23/11 0628 09/22/11 1829  WBC 12.1* 17.7*  NEUTROABS -- 15.3*  HGB 10.8* 12.1*  HCT 29.8* 33.8*  MCV 80.8 81.4  PLT 316 345   Cardiac Enzymes: No results found for this basename: CKTOTAL:5,CKMB:5,CKMBINDEX:5,TROPONINI:5 in the last 168 hours BNP: No components found with this basename: POCBNP:5 CBG:  Lab 09/24/11 0754 09/23/11 2110 09/23/11 1644 09/23/11 1156 09/23/11 0750  GLUCAP 159* 320* 193* 160* 201*    Time coordinating discharge: 45 minute  Signed:  Marinda Elk  [facility] 09/24/2011, 9:49 AM

## 2011-09-24 NOTE — Progress Notes (Signed)
CM consult received for home health aid and PT. Per PT recommendations none. Pt has Medicaid and only allowed 3 visits a year and patient states has already used. Used [facility] agency.  Left message for attending and request for wheelchair for discharge. Need order for wheelchair.  Pt lives with mother. Genella Rife [facility] 09/24/2011 113am

## 2011-09-24 NOTE — Progress Notes (Signed)
Paged attending Dr David Stall to attempt to get order for W/C x 2. Genella Rife [facility] 09/24/2011

## 2011-09-24 NOTE — Progress Notes (Signed)
[name] to be D/C'd Home per MD order.  Discussed with the patient and all questions fully answered.   Gregory, [name]  Home Medication Instructions ZOX:[id number]   Printed on:09/24/11 1516  Medication Information                    fluticasone (FLONASE) 50 MCG/ACT nasal spray Place 2 sprays into the nose daily as needed. For allergies           aspirin 81 MG tablet Take 81 mg by mouth every evening.             HYDROcodone-acetaminophen (NORCO) 5-325 MG per tablet Take 1 tablet by mouth every 6 (six) hours as needed. For pain           FLUoxetine (PROZAC) 20 MG capsule Take 60 mg by mouth daily.           omeprazole (PRILOSEC) 40 MG capsule Take 40 mg by mouth daily.           gabapentin (NEURONTIN) 100 MG capsule Take 1 capsule (100 mg total) by mouth 3 (three) times daily.           insulin aspart (NOVOLOG) 100 UNIT/ML injection Inject 1-10 Units into the skin 3 (three) times daily with meals. Sliding scale           fludrocortisone (FLORINEF) 0.1 MG tablet Take 0.1 mg by mouth 2 (two) times daily. May take third dose if needed for anxiety           diphenhydrAMINE (BENADRYL) 25 mg capsule Take 25 mg by mouth at bedtime as needed. For sleep           insulin glargine (LANTUS) 100 UNIT/ML injection Inject 30 Units into the skin at bedtime.           predniSONE (DELTASONE) 10 MG tablet Take 1 tablet (10 mg total) by mouth daily with breakfast.             VVS, Skin clean, dry and intact without evidence of skin break down, no evidence of skin tears noted. IV catheter discontinued intact. Site without signs and symptoms of complications. Dressing and pressure applied.  An After Visit Summary was printed and given to the patient. Follow up appointments , new prescriptions and medication administration times given Patient escorted via WC, and D/C home via private auto.  Cindra Eves, RN 09/24/2011 3:16 PM

## 2011-10-14 ENCOUNTER — Encounter (HOSPITAL_COMMUNITY): Payer: Self-pay

## 2011-10-14 ENCOUNTER — Emergency Department (HOSPITAL_COMMUNITY): Payer: Medicaid Other

## 2011-10-14 ENCOUNTER — Emergency Department (HOSPITAL_COMMUNITY)
Admission: EM | Admit: 2011-10-14 | Discharge: 2011-10-14 | Disposition: A | Discharge status: Home / Self Care | Payer: Medicaid Other | Attending: Emergency Medicine | Admitting: Emergency Medicine

## 2011-10-14 DIAGNOSIS — R531 Weakness: Secondary | ICD-10-CM

## 2011-10-14 DIAGNOSIS — Z7982 Long term (current) use of aspirin: Secondary | ICD-10-CM | POA: Insufficient documentation

## 2011-10-14 DIAGNOSIS — G4730 Sleep apnea, unspecified: Secondary | ICD-10-CM | POA: Insufficient documentation

## 2011-10-14 DIAGNOSIS — R5381 Other malaise: Secondary | ICD-10-CM | POA: Insufficient documentation

## 2011-10-14 DIAGNOSIS — Z8659 Personal history of other mental and behavioral disorders: Secondary | ICD-10-CM | POA: Insufficient documentation

## 2011-10-14 DIAGNOSIS — M79609 Pain in unspecified limb: Secondary | ICD-10-CM | POA: Insufficient documentation

## 2011-10-14 DIAGNOSIS — R4182 Altered mental status, unspecified: Secondary | ICD-10-CM | POA: Insufficient documentation

## 2011-10-14 DIAGNOSIS — F341 Dysthymic disorder: Secondary | ICD-10-CM | POA: Insufficient documentation

## 2011-10-14 DIAGNOSIS — Z794 Long term (current) use of insulin: Secondary | ICD-10-CM | POA: Insufficient documentation

## 2011-10-14 DIAGNOSIS — E119 Type 2 diabetes mellitus without complications: Secondary | ICD-10-CM | POA: Insufficient documentation

## 2011-10-14 DIAGNOSIS — K219 Gastro-esophageal reflux disease without esophagitis: Secondary | ICD-10-CM | POA: Insufficient documentation

## 2011-10-14 HISTORY — DX: Unspecified intracranial injury with loss of consciousness status unknown, initial encounter: S06.9XAA

## 2011-10-14 HISTORY — DX: Unspecified intracranial injury with loss of consciousness of unspecified duration, initial encounter: S06.9X9A

## 2011-10-14 LAB — CBC WITH DIFFERENTIAL/PLATELET
Eosinophils Absolute: 0.1 10*3/uL (ref 0.0–0.7)
Eosinophils Relative: 1 % (ref 0–5)
HCT: 34.2 % — ABNORMAL LOW (ref 39.0–52.0)
Hemoglobin: 12.1 g/dL — ABNORMAL LOW (ref 13.0–17.0)
Lymphs Abs: 2.1 10*3/uL (ref 0.7–4.0)
MCH: 28.9 pg (ref 26.0–34.0)
MCV: 81.8 fL (ref 78.0–100.0)
Monocytes Relative: 9 % (ref 3–12)
RBC: 4.18 MIL/uL — ABNORMAL LOW (ref 4.22–5.81)

## 2011-10-14 LAB — URINALYSIS, ROUTINE W REFLEX MICROSCOPIC
Bilirubin Urine: NEGATIVE
Hgb urine dipstick: NEGATIVE
Specific Gravity, Urine: 1.017 (ref 1.005–1.030)
Urobilinogen, UA: 1 mg/dL (ref 0.0–1.0)

## 2011-10-14 LAB — BASIC METABOLIC PANEL
BUN: 6 mg/dL (ref 6–23)
Calcium: 8.9 mg/dL (ref 8.4–10.5)
GFR calc non Af Amer: >90 mL/min (ref >90)
Glucose, Bld: 138 mg/dL — ABNORMAL HIGH (ref 70–99)
Potassium: 3.8 mEq/L (ref 3.5–5.1)

## 2011-10-14 MED ORDER — SODIUM CHLORIDE 0.9 % IV SOLN
INTRAVENOUS | Status: DC
  Administered 2011-10-14: 11:00:00 via INTRAVENOUS

## 2011-10-14 MED ORDER — SODIUM CHLORIDE 0.9 % IV BOLUS (SEPSIS)
500.0000 mL | Freq: Once | INTRAVENOUS | Status: AC
  Administered 2011-10-14: 10:00:00 via INTRAVENOUS

## 2011-10-14 NOTE — ED Notes (Signed)
Explained to Patient we need a urine sample.  Patient stated he would urinate in a urinal but does not need to urinate at this time.

## 2011-10-14 NOTE — ED Notes (Signed)
Social worker consult at bedside.

## 2011-10-14 NOTE — ED Notes (Signed)
Ortho paged to bring pt crutches

## 2011-10-14 NOTE — ED Notes (Signed)
Dr. Effie Shy at bedside to see wound.

## 2011-10-14 NOTE — ED Notes (Signed)
Pt given Diet Pepsi.

## 2011-10-14 NOTE — ED Notes (Signed)
Pt. Woke up with weakness and pain in extremities, garbled speech; confused, follows commands. cbg 128; according to mom, normal weakness in extremities, and garbled speech.

## 2011-10-14 NOTE — ED Notes (Addendum)
Pt returned to POD A bed 8 on stretcher with tech from radiology. Tolerated well.

## 2011-10-14 NOTE — ED Notes (Signed)
Assisted patient to a standing position at bedside. Attempted to give urine. Unsuccessful. Assisted back to bed. Tolerated well.

## 2011-10-14 NOTE — ED Notes (Signed)
Pt transported to Radiology on stretcher with tech.

## 2011-10-14 NOTE — ED Notes (Signed)
Ann, Case Management with patient at this time.

## 2011-10-14 NOTE — ED Notes (Signed)
Notified RN of CBG 137 

## 2011-10-14 NOTE — Progress Notes (Signed)
Orthopedic Tech Progress Note Patient Details:  [name] 06-12-1955 [id number]  Ortho Devices Type of Ortho Device: Crutches Ortho Device/Splint Interventions: Casandra Doffing 10/14/2011, 1:19 PM

## 2011-10-14 NOTE — Progress Notes (Signed)
Met with patient to discuss home health needs. Mother and son agree assistance in the home would be warranted and we agreed to contact [facility] and [facility]. I have communicated new orders to Donna of [facility] and requested assistance from Tim Henderson of [facility]. I have also requested SW to provide a taxi voucher for mother and son.   
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
## 2011-10-14 NOTE — ED Notes (Signed)
CSW met  With Pt per request of MD who states that Pt has declining health and does not have assistance at home. CSW and Pt discussed current situation with himself and his mother and Pt agrees that he is struggling to make it to appointments and fill out necessary insurance paperwork due to his health conditions. Pt is challenged by several forms of anxiety related disorders and is caring for his aging mother who has dementia. Pt has multiple health issues as well and could potentially benefit from an ALF. CSW discussed Pt returning home with home health follow up and assist with potential placement at ALF for both Pt and his mother. CSW discussed transportation issues as well with Pt. Pt stated that he has not finished the paperwork for the service.    CSW called CM to discuss home health options to assist Pt and his mother with placement at ALF.    Gregory Hamman, LCSW  [phone]

## 2011-10-15 LAB — URINE CULTURE: Colony Count: 6000

## 2011-10-25 ENCOUNTER — Emergency Department (HOSPITAL_COMMUNITY)
Admission: EM | Admit: 2011-10-25 | Discharge: 2011-10-25 | Disposition: A | Discharge status: Home / Self Care | Payer: Medicaid Other | Attending: Emergency Medicine | Admitting: Emergency Medicine

## 2011-10-25 ENCOUNTER — Encounter (HOSPITAL_COMMUNITY): Payer: Self-pay | Admitting: Emergency Medicine

## 2011-10-25 DIAGNOSIS — W278XXA Contact with other nonpowered hand tool, initial encounter: Secondary | ICD-10-CM | POA: Insufficient documentation

## 2011-10-25 DIAGNOSIS — E1169 Type 2 diabetes mellitus with other specified complication: Secondary | ICD-10-CM | POA: Insufficient documentation

## 2011-10-25 DIAGNOSIS — Z79899 Other long term (current) drug therapy: Secondary | ICD-10-CM | POA: Insufficient documentation

## 2011-10-25 DIAGNOSIS — S91312A Laceration without foreign body, left foot, initial encounter: Secondary | ICD-10-CM

## 2011-10-25 DIAGNOSIS — S91309A Unspecified open wound, unspecified foot, initial encounter: Secondary | ICD-10-CM | POA: Insufficient documentation

## 2011-10-25 DIAGNOSIS — F3289 Other specified depressive episodes: Secondary | ICD-10-CM | POA: Insufficient documentation

## 2011-10-25 DIAGNOSIS — Z87820 Personal history of traumatic brain injury: Secondary | ICD-10-CM | POA: Insufficient documentation

## 2011-10-25 DIAGNOSIS — F209 Schizophrenia, unspecified: Secondary | ICD-10-CM | POA: Insufficient documentation

## 2011-10-25 DIAGNOSIS — R5381 Other malaise: Secondary | ICD-10-CM | POA: Insufficient documentation

## 2011-10-25 DIAGNOSIS — E2749 Other adrenocortical insufficiency: Secondary | ICD-10-CM | POA: Insufficient documentation

## 2011-10-25 DIAGNOSIS — I959 Hypotension, unspecified: Secondary | ICD-10-CM

## 2011-10-25 DIAGNOSIS — K219 Gastro-esophageal reflux disease without esophagitis: Secondary | ICD-10-CM | POA: Insufficient documentation

## 2011-10-25 DIAGNOSIS — R42 Dizziness and giddiness: Secondary | ICD-10-CM | POA: Insufficient documentation

## 2011-10-25 DIAGNOSIS — F411 Generalized anxiety disorder: Secondary | ICD-10-CM | POA: Insufficient documentation

## 2011-10-25 DIAGNOSIS — Z794 Long term (current) use of insulin: Secondary | ICD-10-CM | POA: Insufficient documentation

## 2011-10-25 DIAGNOSIS — F329 Major depressive disorder, single episode, unspecified: Secondary | ICD-10-CM | POA: Insufficient documentation

## 2011-10-25 DIAGNOSIS — L97509 Non-pressure chronic ulcer of other part of unspecified foot with unspecified severity: Secondary | ICD-10-CM | POA: Insufficient documentation

## 2011-10-25 LAB — POCT I-STAT, CHEM 8
BUN: 3 mg/dL — ABNORMAL LOW (ref 6–23)
Calcium, Ion: 1.17 mmol/L (ref 1.12–1.23)
Chloride: 96 mEq/L (ref 96–112)
HCT: 37 % — ABNORMAL LOW (ref 39.0–52.0)
Potassium: 3.5 mEq/L (ref 3.5–5.1)
Sodium: 136 mEq/L (ref 135–145)

## 2011-10-25 MED ORDER — SODIUM CHLORIDE 0.9 % IV BOLUS (SEPSIS)
1000.0000 mL | Freq: Once | INTRAVENOUS | Status: AC
  Administered 2011-10-25: 1000 mL via INTRAVENOUS

## 2011-10-25 MED ORDER — PHENYLEPHRINE IN HARD FAT 0.25 % RE SUPP: 1.0000 | Freq: Two times a day (BID) | RECTAL | Status: DC

## 2011-10-25 MED ORDER — PHENYLEPHRINE IN HARD FAT 0.25 % RE SUPP
1.0000 | Freq: Two times a day (BID) | RECTAL | Status: DC
  Filled 2011-10-25 (×2): qty 1

## 2011-10-25 MED ORDER — BACITRACIN ZINC 500 UNIT/GM EX OINT
TOPICAL_OINTMENT | CUTANEOUS | Status: AC
  Filled 2011-10-25: qty 2.7

## 2011-10-25 MED ORDER — SODIUM CHLORIDE 0.9 % IV SOLN
Freq: Once | INTRAVENOUS | Status: AC
  Administered 2011-10-25: 11:00:00 via INTRAVENOUS

## 2011-10-25 MED ORDER — ALBUTEROL SULFATE (5 MG/ML) 0.5% IN NEBU
2.5000 mg | INHALATION_SOLUTION | Freq: Once | RESPIRATORY_TRACT | Status: AC
  Administered 2011-10-25: 2.5 mg via RESPIRATORY_TRACT
  Filled 2011-10-25: qty 0.5

## 2011-10-25 NOTE — ED Notes (Signed)
While rn was assessing pts blood pressure in range 70/40. 2 IV started and pt moved to room 6

## 2011-10-25 NOTE — ED Notes (Signed)
RT notified regarding medication order.

## 2011-10-25 NOTE — ED Notes (Addendum)
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 Pt was doing wound care dressing this am and cut top of his foot with medical sissors, laceration 1 inch. Bleeding controlled with pressure bandage

## 2011-10-25 NOTE — ED Notes (Signed)
ems reports pt has a wound on left foot. Hx of diabetic neuropathy. Pt was taking bandage off foot and caused bleeding to top of foot. Located below previous wound.

## 2011-10-25 NOTE — ED Notes (Signed)
Ptar notified regarding pt transport back home.
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
## 2011-10-25 NOTE — ED Notes (Signed)
Removed IV catheter tip intact

## 2011-10-26 ENCOUNTER — Encounter: Payer: Self-pay | Admitting: Infectious Disease

## 2011-10-26 ENCOUNTER — Ambulatory Visit (INDEPENDENT_AMBULATORY_CARE_PROVIDER_SITE_OTHER): Payer: Medicaid Other | Admitting: Infectious Disease

## 2011-10-26 ENCOUNTER — Ambulatory Visit: Payer: Medicaid Other | Admitting: Infectious Disease

## 2011-10-26 VITALS — BP 149/78 | HR 80 | Temp 98.3°F | Wt 185.0 lb

## 2011-10-26 DIAGNOSIS — S91319A Laceration without foreign body, unspecified foot, initial encounter: Secondary | ICD-10-CM | POA: Insufficient documentation

## 2011-10-26 DIAGNOSIS — M869 Osteomyelitis, unspecified: Secondary | ICD-10-CM

## 2011-10-26 DIAGNOSIS — S91309A Unspecified open wound, unspecified foot, initial encounter: Secondary | ICD-10-CM

## 2011-10-26 NOTE — Progress Notes (Signed)
Subjective:    Patient ID: [name], male    DOB: 1955/07/14, 56 y.o.   MRN: [id number]  HPI  56year-old male with known history of diabetes mellitus type 2, hypertension, neuropathy, post traumatic  stress disorder, schizophrenia, sleep apnea, adrenal insufficiency  who was admitted to [facility] with osteomyelitis of the foot. He underwent two separate I and D of bone by Dr Victorino Dike with I and D of left foot abscess and resection of part of the 5th metatarsal with MSSA growing from cultures. He was narrowed to cefazolin and remained on this since dc in late September. Into October when they were stopped. His foot pain has improved dramatically. Followed closely by Dr. Victorino Dike who is released him from his care. The patient last time in March of this year. We continue to observe him off antibiotics. 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 He recently sustained a laceration to his left foot was changing a dressing. This is a tentative emergent department where laceration was sutured. He has no evidence of acute infection at this point time but he does continue to have an ulceration over his left heel.  Is without fevers nausea or malaise or other systemic symptoms at this point time.   Review of Systems  Constitutional: Positive for appetite change and fatigue. Negative for fever, chills, diaphoresis, activity change and unexpected weight change.  HENT: Negative for congestion, sore throat, rhinorrhea, sneezing, trouble swallowing and sinus pressure.   Eyes: Negative for photophobia and visual disturbance.  Respiratory: Negative for cough, chest tightness, shortness of breath, wheezing and stridor.   Cardiovascular: Negative for chest pain, palpitations and leg swelling.  Gastrointestinal: Positive for abdominal pain. Negative for nausea, vomiting, diarrhea, constipation, blood in stool,  abdominal distention and anal bleeding.  Genitourinary: Negative for dysuria, hematuria, flank pain and difficulty urinating.  Musculoskeletal: Negative for myalgias, back pain, joint swelling, arthralgias and gait problem.  Skin: Positive for color change and wound. Negative for pallor and rash.  Neurological: Negative for dizziness, tremors, weakness and light-headedness.  Hematological: Negative for adenopathy. Does not bruise/bleed easily.  Psychiatric/Behavioral: Negative for behavioral problems, confusion, disturbed wake/sleep cycle, dysphoric mood, decreased concentration and agitation.       Objective:   Physical Exam  Constitutional: He is oriented to person, place, and time. No distress.  HENT:  Head: Normocephalic and atraumatic.  Mouth/Throat: Oropharynx is clear and moist. No oropharyngeal exudate.  Eyes: Conjunctivae normal and EOM are normal. Pupils are equal, round, and reactive to light. No scleral icterus.  Neck: Normal range of motion. Neck supple. No JVD present.  Cardiovascular: Normal rate, regular rhythm and normal heart sounds.  Exam reveals no gallop and no friction rub.   No murmur heard. Pulmonary/Chest: Effort normal and breath sounds normal. No respiratory distress. He has no wheezes. He has no rales. He exhibits no tenderness.  Abdominal: He exhibits no distension and no mass. There is no tenderness. There is no rebound and no guarding.  Musculoskeletal: He exhibits no edema and no tenderness.       Feet:  Lymphadenopathy:    He has no cervical adenopathy.  Neurological: He is alert and oriented to person, place, and time. He has normal reflexes. He exhibits normal muscle tone. Coordination normal.  Skin: Skin is warm and dry. He is not diaphoretic. There is erythema. No pallor.  Psychiatric: He has a normal mood  and affect. His behavior is normal. Judgment and thought content normal.          Assessment & Plan:

## 2011-10-26 NOTE — Assessment & Plan Note (Signed)
Check sedimentation rate and C-reactive protein. Do worry about persistent possible infection deep to the ulcer on his heel.

## 2011-10-26 NOTE — Assessment & Plan Note (Signed)
1 areas been sutured appropriately. There is no evidence of active infection about the new laceration site.

## 2011-11-06 ENCOUNTER — Emergency Department (HOSPITAL_COMMUNITY)
Admission: EM | Admit: 2011-11-06 | Discharge: 2011-11-06 | Disposition: A | Discharge status: Home / Self Care | Payer: Medicaid Other | Attending: Emergency Medicine | Admitting: Emergency Medicine

## 2011-11-06 ENCOUNTER — Encounter (HOSPITAL_COMMUNITY): Payer: Self-pay | Admitting: Emergency Medicine

## 2011-11-06 DIAGNOSIS — Z794 Long term (current) use of insulin: Secondary | ICD-10-CM | POA: Insufficient documentation

## 2011-11-06 DIAGNOSIS — Z87820 Personal history of traumatic brain injury: Secondary | ICD-10-CM | POA: Insufficient documentation

## 2011-11-06 DIAGNOSIS — Z79899 Other long term (current) drug therapy: Secondary | ICD-10-CM | POA: Insufficient documentation

## 2011-11-06 DIAGNOSIS — F4310 Post-traumatic stress disorder, unspecified: Secondary | ICD-10-CM | POA: Insufficient documentation

## 2011-11-06 DIAGNOSIS — E119 Type 2 diabetes mellitus without complications: Secondary | ICD-10-CM | POA: Insufficient documentation

## 2011-11-06 DIAGNOSIS — G4733 Obstructive sleep apnea (adult) (pediatric): Secondary | ICD-10-CM | POA: Insufficient documentation

## 2011-11-06 DIAGNOSIS — F209 Schizophrenia, unspecified: Secondary | ICD-10-CM | POA: Insufficient documentation

## 2011-11-06 DIAGNOSIS — K219 Gastro-esophageal reflux disease without esophagitis: Secondary | ICD-10-CM | POA: Insufficient documentation

## 2011-11-06 DIAGNOSIS — Z4802 Encounter for removal of sutures: Secondary | ICD-10-CM

## 2011-11-06 NOTE — ED Notes (Signed)
Pts r/foot was sutured 12 days . Laceration repair

## 2011-11-06 NOTE — ED Provider Notes (Signed)
Medical screening examination/treatment/procedure(s) were performed by non-physician practitioner and as supervising physician I was immediately available for consultation/collaboration.  Labrian Torregrossa T Engelbert Sevin, MD 11/06/11 1503 
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
## 2011-11-09 ENCOUNTER — Telehealth: Payer: Self-pay | Admitting: *Deleted

## 2011-11-09 NOTE — Telephone Encounter (Signed)
[facility] RN called asking whether there were any continuing home health orders for wound assessment and treatment.  Orders are ending after today's visit.  Dr. Daiva Eves, please call Diane at [phone] with addl orders or d/c visits.

## 2012-01-25 ENCOUNTER — Ambulatory Visit: Payer: Medicaid Other | Admitting: Infectious Disease

## 2012-02-23 ENCOUNTER — Ambulatory Visit (INDEPENDENT_AMBULATORY_CARE_PROVIDER_SITE_OTHER): Payer: Medicaid Other | Admitting: Infectious Disease

## 2012-02-23 ENCOUNTER — Encounter: Payer: Self-pay | Admitting: Infectious Disease

## 2012-02-23 VITALS — BP 177/90 | HR 94 | Temp 97.8°F | Ht 70.5 in | Wt 195.0 lb

## 2012-02-23 DIAGNOSIS — Z23 Encounter for immunization: Secondary | ICD-10-CM

## 2012-02-23 DIAGNOSIS — I10 Essential (primary) hypertension: Secondary | ICD-10-CM

## 2012-02-23 DIAGNOSIS — M869 Osteomyelitis, unspecified: Secondary | ICD-10-CM

## 2012-02-23 NOTE — Progress Notes (Signed)
Subjective:    Patient ID: [name], male    DOB: 1955-11-03, 57 y.o.   MRN: [id number]  HPI  57 year-old male with known history of diabetes mellitus type 2, hypertension, neuropathy, post traumatic  stress disorder, schizophrenia, sleep apnea, adrenal insufficiency who was admitted to [facility] with osteomyelitis of the foot. He underwent two separate I and D of bone by Dr Victorino Dike with I and D of left foot abscess and resection of part of the 5th metatarsal with MSSA growing from cultures. He was narrowed to cefazolin and remained on this since dc in late September. Into October when they were stopped.  Followed closely by Dr. Victorino Dike who is released him from his care. We have been watching him off of antibiotics. His surgical site has healed up nicely and heel wound is closed. He is now with a n ulceration on the dorsum of his foot that he says is related to a special boot he was wearing that he is no longer wearing. He claims it is healing up well at present. His inflammatory markers were encouraging the last time we checked them. He has no complaints today.   Review of Systems  Constitutional: Negative for fever, chills, diaphoresis, activity change, appetite change, fatigue and unexpected weight change.  HENT: Negative for congestion, sore throat, rhinorrhea, sneezing, trouble swallowing and sinus pressure.   Eyes: Negative for photophobia and visual disturbance.  Respiratory: Negative for cough, chest tightness, shortness of breath, wheezing and stridor.   Cardiovascular: Negative for chest pain, palpitations and leg swelling.  Gastrointestinal: Negative for nausea, vomiting, abdominal pain, diarrhea, constipation, blood in stool, abdominal distention and anal bleeding.  Genitourinary: Negative for dysuria, hematuria, flank pain and difficulty urinating.  Musculoskeletal: Negative for myalgias, back pain, joint swelling, arthralgias and gait problem.  Skin: Positive for wound. Negative  for color change, pallor and rash.  Neurological: Negative for dizziness, tremors, weakness and light-headedness.  Hematological: Negative for adenopathy. Does not bruise/bleed easily.  Psychiatric/Behavioral: Negative for behavioral problems, confusion, sleep disturbance, dysphoric mood, decreased concentration and agitation.       Objective:   Physical Exam  Constitutional: He is oriented to person, place, and time. He appears well-developed and well-nourished. No distress.  HENT:  Head: Normocephalic and atraumatic.  Mouth/Throat: Oropharynx is clear and moist. No oropharyngeal exudate.  Eyes: Conjunctivae normal and EOM are normal. Pupils are equal, round, and reactive to light. No scleral icterus.  Neck: Normal range of motion. Neck supple. No JVD present.  Cardiovascular: Normal rate, regular rhythm and normal heart sounds.  Exam reveals no gallop and no friction rub.   No murmur heard. Pulmonary/Chest: Effort normal and breath sounds normal. No respiratory distress. He has no wheezes. He has no rales. He exhibits no tenderness.  Abdominal: He exhibits no distension and no mass. There is no tenderness. There is no rebound and no guarding.  Musculoskeletal: He exhibits no edema and no tenderness.       Feet:  Lymphadenopathy:    He has no cervical adenopathy.  Neurological: He is alert and oriented to person, place, and time. He exhibits normal muscle tone. Coordination normal.  Skin: Skin is warm and dry. He is not diaphoretic. No erythema. No pallor.  Psychiatric: He has a normal mood and affect. His behavior is normal. Judgment and thought content normal.          Assessment & Plan:   Osteomyelitis left foot: sp surgery and doing well off of antibiotics. Check  ESR, CRp  HTN; bp high today but he claims is labile. Defer to PCP

## 2012-02-24 LAB — SEDIMENTATION RATE: Sed Rate: 1 mm/hr (ref 0–16)

## 2012-02-24 LAB — C-REACTIVE PROTEIN: CRP: <0.5 mg/dL (ref <0.60)

## 2012-03-18 IMAGING — CR DG CHEST 2V
1 series · 1 of 1 positions shown · non-contrast
Comparison: 10/21/2010 and earlier.

CLINICAL DATA: 54-year-old male with left foot infection.

CHEST - 2 VIEW

[w chest lat]
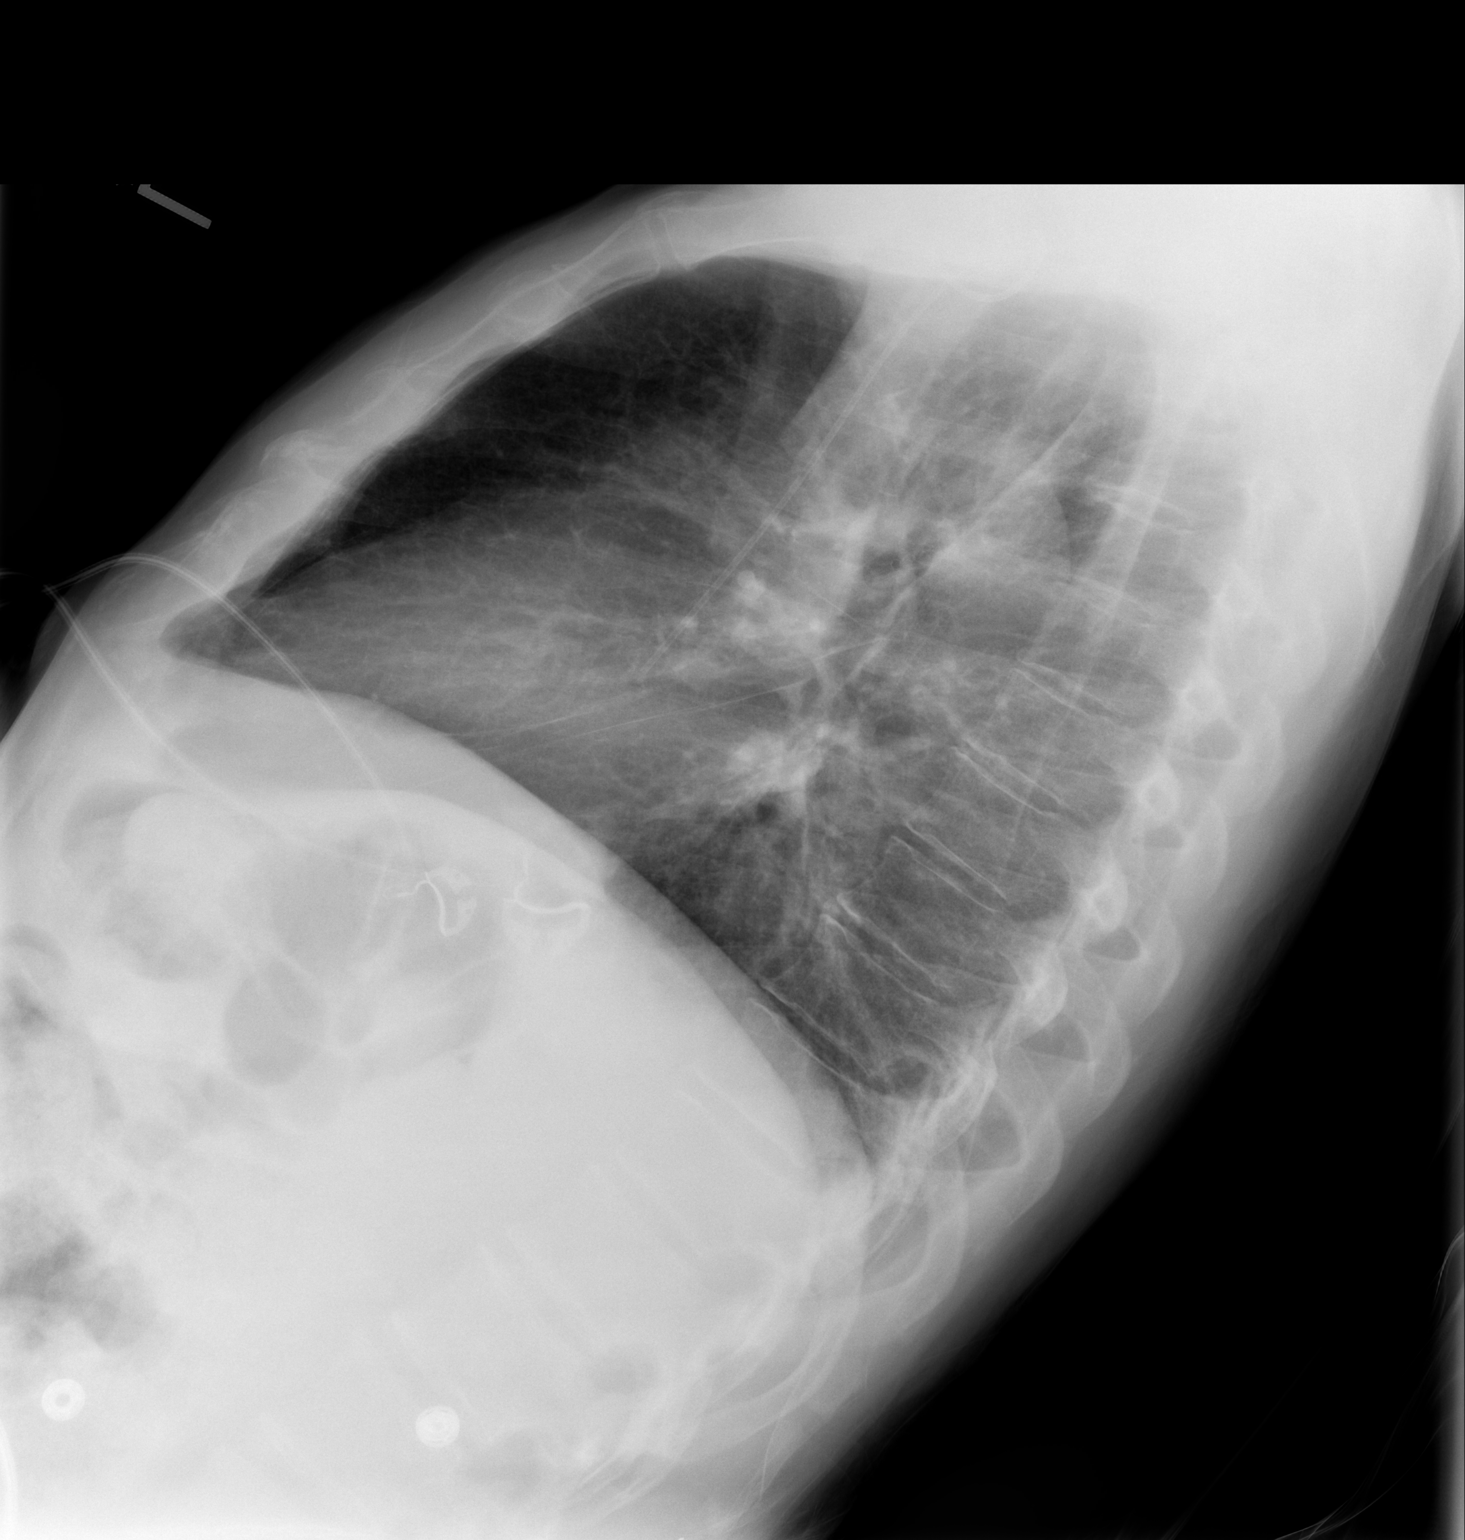

[1 of 1 positions shown; findings below may reference images not displayed]

FINDINGS: AP semi upright view at 7545 hours.  Right PICC line in
place, tip at the lower SVC level.  Better lung volumes. Normal
cardiac size and mediastinal contours.  No pneumothorax, pulmonary
edema, pleural effusion or confluent pulmonary opacity.
IMPRESSION: No acute cardiopulmonary abnormality.  Right PICC line in place.

## 2012-08-26 IMAGING — CR DG CHEST 2V
2 series · 2 of 2 positions shown · non-contrast
Comparison: 10/22/2010

CLINICAL DATA: Weakness, diabetes, syncope

CHEST - 2 VIEW

[w chest pa]
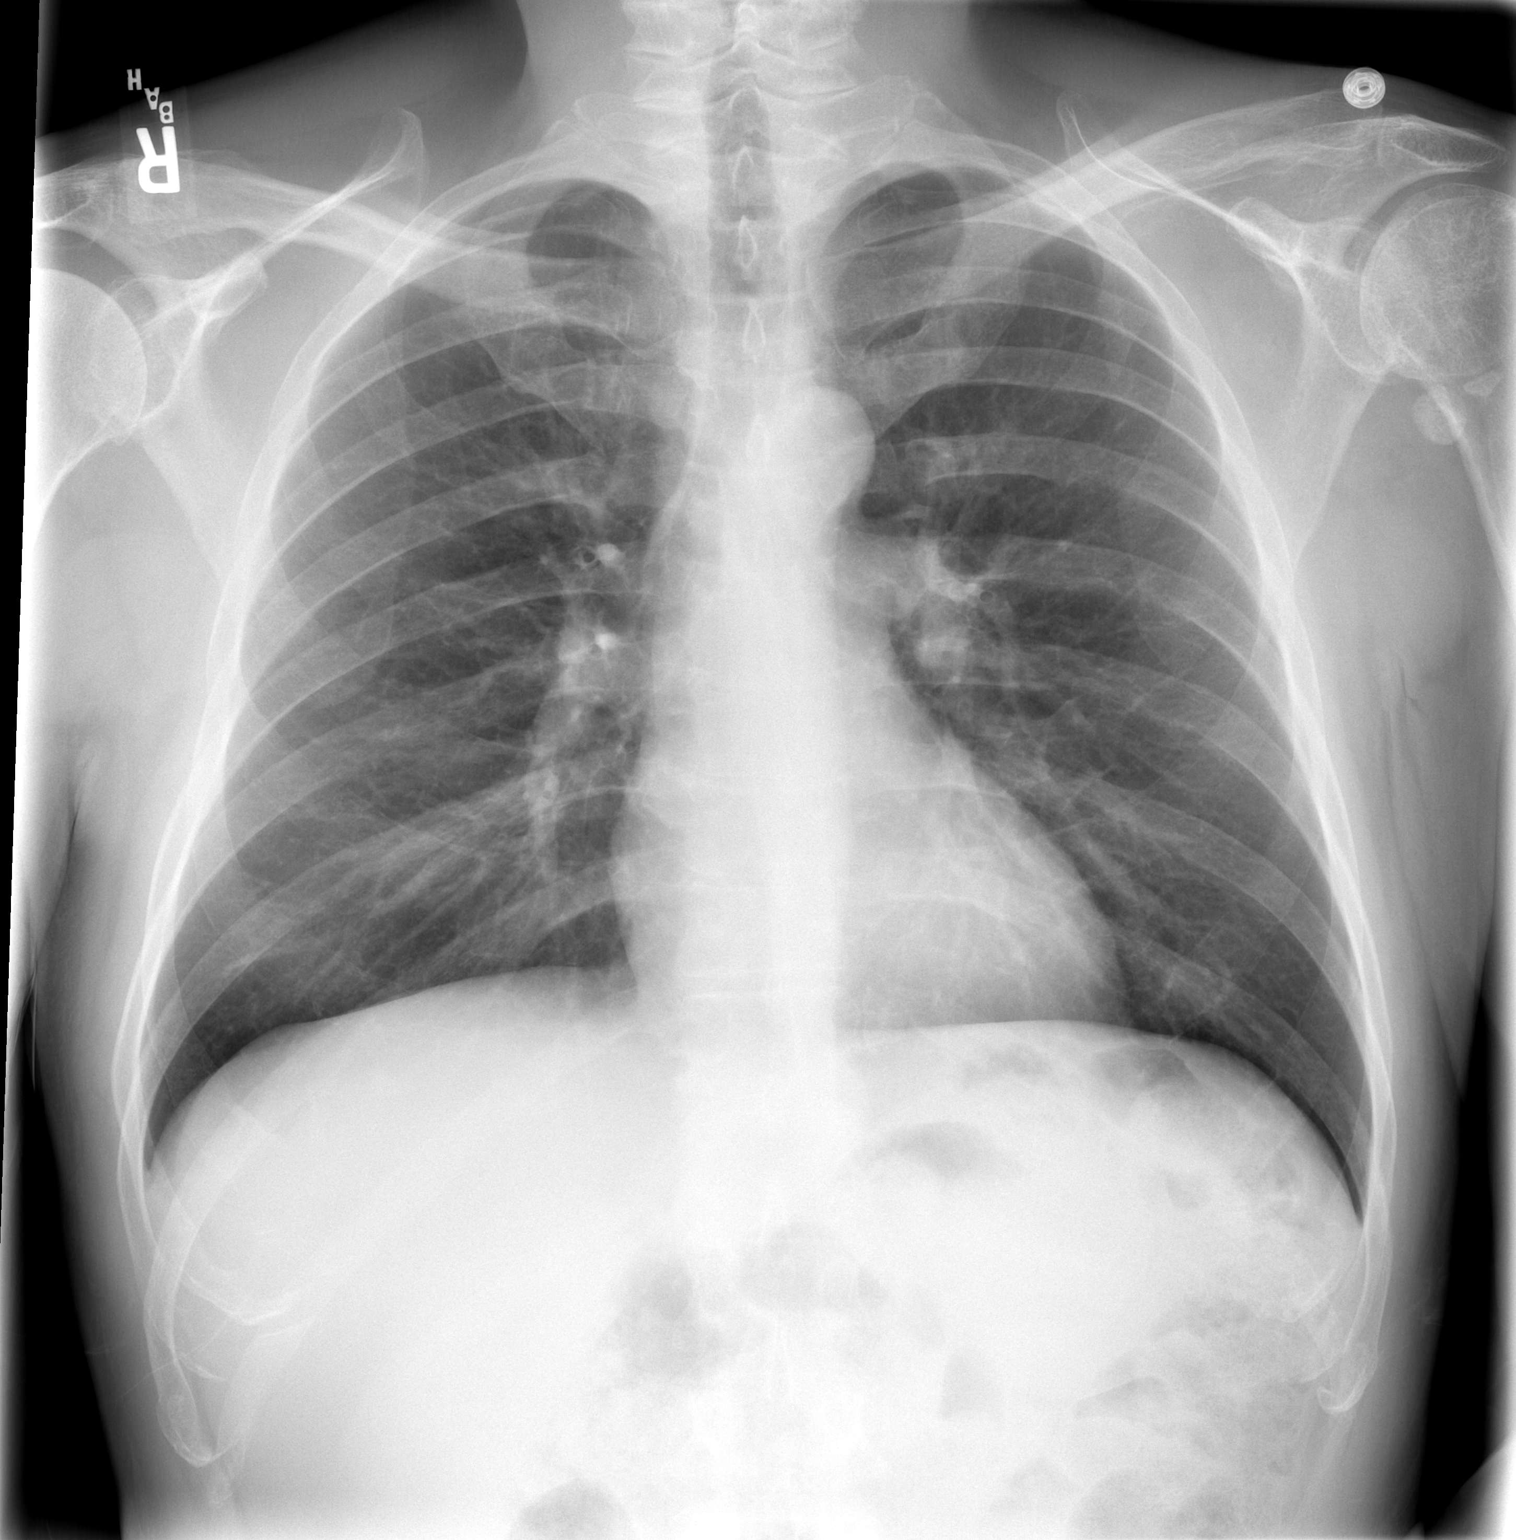

[w chest lat]
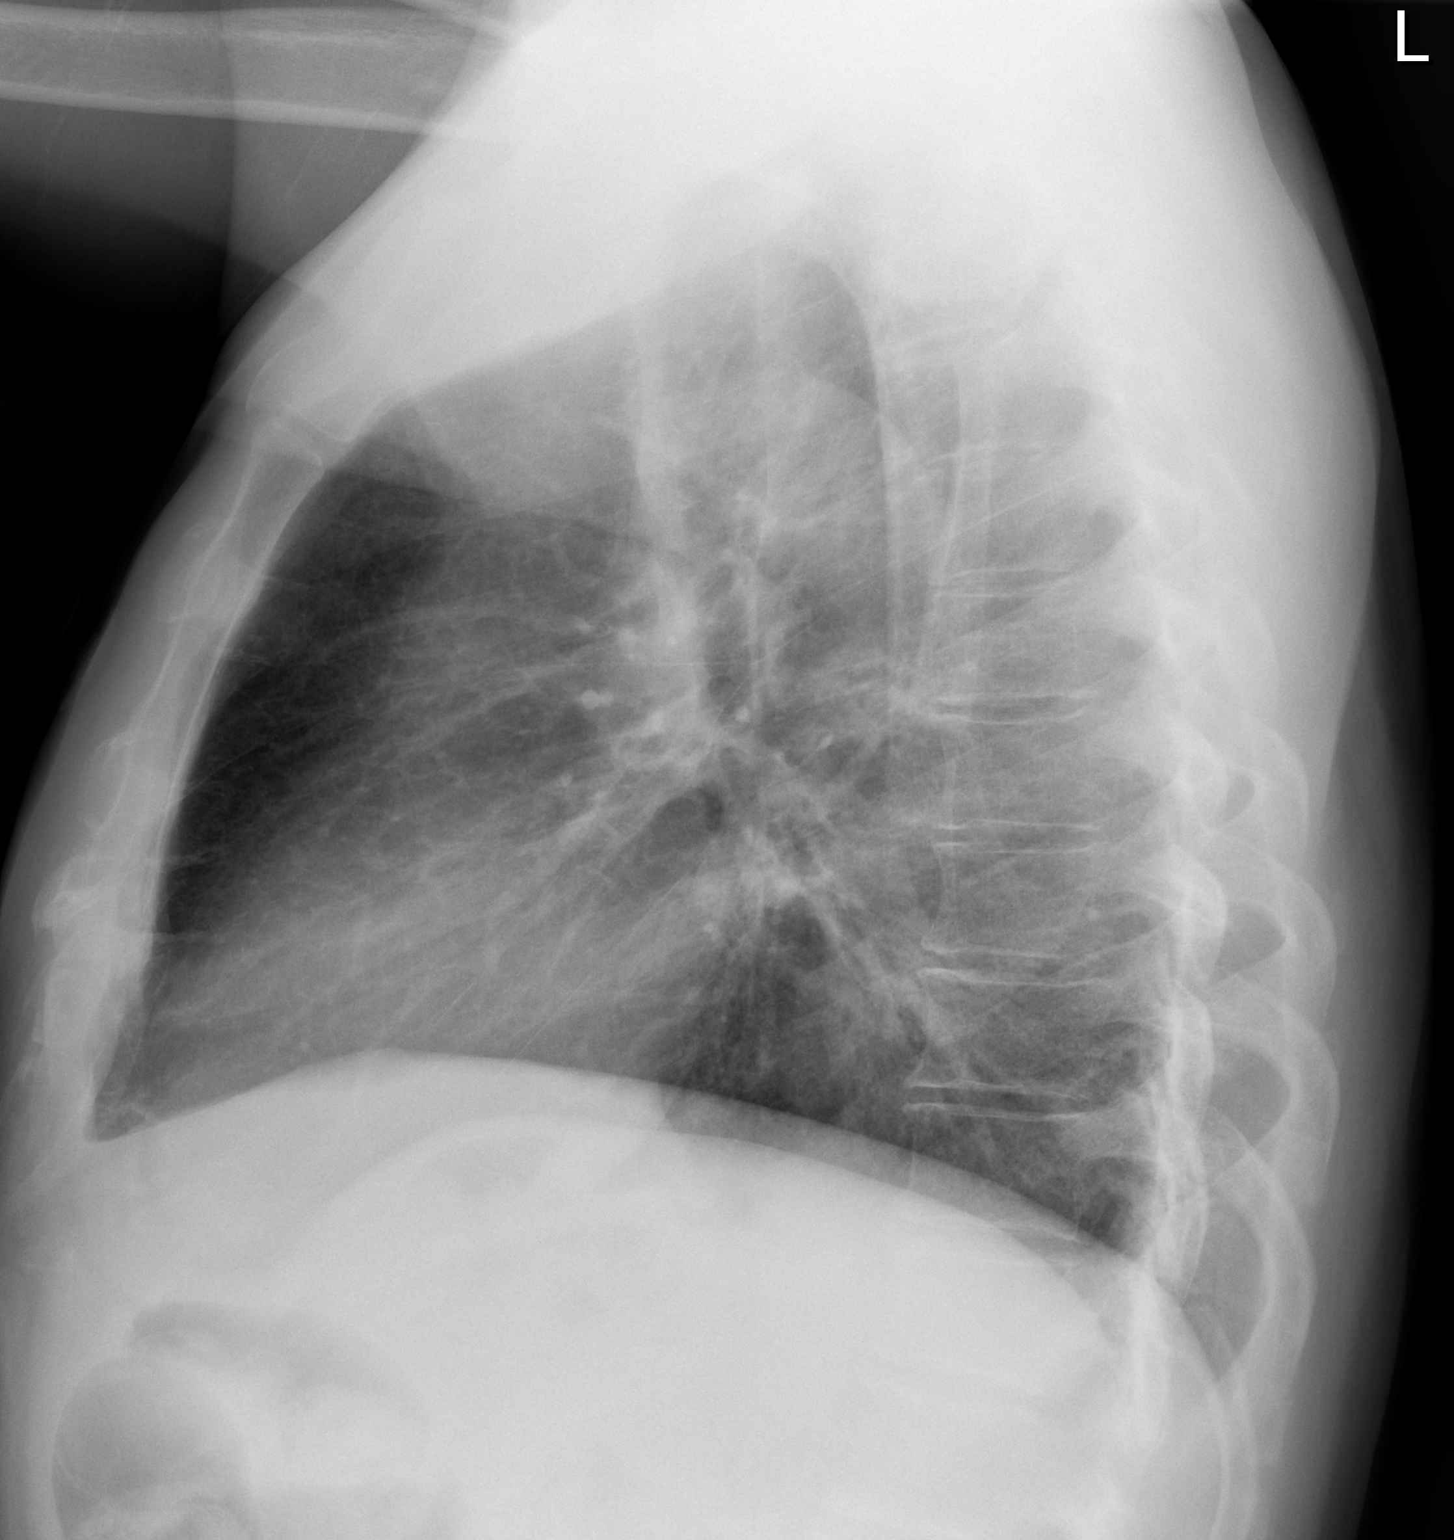

[2 of 2 positions shown; findings below may reference images not displayed]

FINDINGS: Normal heart size, mediastinal contours, and pulmonary vascularity.
Bronchitic changes without infiltrate or effusion.
No pneumothorax.
Calcified loose bodies adjacent to the left humeral head.
No acute osseous findings.
IMPRESSION: Bronchitic changes.

## 2013-09-03 ENCOUNTER — Emergency Department (HOSPITAL_COMMUNITY): Payer: Medicaid Other

## 2013-09-03 ENCOUNTER — Encounter (HOSPITAL_COMMUNITY): Payer: Self-pay | Admitting: Emergency Medicine

## 2013-09-03 ENCOUNTER — Emergency Department (HOSPITAL_COMMUNITY)
Admission: EM | Admit: 2013-09-03 | Discharge: 2013-09-03 | Disposition: A | Discharge status: Home / Self Care | Payer: Medicaid Other | Attending: Emergency Medicine | Admitting: Emergency Medicine

## 2013-09-03 DIAGNOSIS — E119 Type 2 diabetes mellitus without complications: Secondary | ICD-10-CM | POA: Insufficient documentation

## 2013-09-03 DIAGNOSIS — Z7982 Long term (current) use of aspirin: Secondary | ICD-10-CM | POA: Diagnosis not present

## 2013-09-03 DIAGNOSIS — E869 Volume depletion, unspecified: Secondary | ICD-10-CM

## 2013-09-03 DIAGNOSIS — K219 Gastro-esophageal reflux disease without esophagitis: Secondary | ICD-10-CM | POA: Insufficient documentation

## 2013-09-03 DIAGNOSIS — L97509 Non-pressure chronic ulcer of other part of unspecified foot with unspecified severity: Secondary | ICD-10-CM | POA: Insufficient documentation

## 2013-09-03 DIAGNOSIS — R5381 Other malaise: Secondary | ICD-10-CM | POA: Diagnosis not present

## 2013-09-03 DIAGNOSIS — IMO0002 Reserved for concepts with insufficient information to code with codable children: Secondary | ICD-10-CM | POA: Diagnosis not present

## 2013-09-03 DIAGNOSIS — F329 Major depressive disorder, single episode, unspecified: Secondary | ICD-10-CM | POA: Diagnosis not present

## 2013-09-03 DIAGNOSIS — E1169 Type 2 diabetes mellitus with other specified complication: Secondary | ICD-10-CM | POA: Diagnosis not present

## 2013-09-03 DIAGNOSIS — F3289 Other specified depressive episodes: Secondary | ICD-10-CM | POA: Diagnosis not present

## 2013-09-03 DIAGNOSIS — Z79899 Other long term (current) drug therapy: Secondary | ICD-10-CM | POA: Insufficient documentation

## 2013-09-03 DIAGNOSIS — Z87820 Personal history of traumatic brain injury: Secondary | ICD-10-CM | POA: Insufficient documentation

## 2013-09-03 DIAGNOSIS — F4310 Post-traumatic stress disorder, unspecified: Secondary | ICD-10-CM | POA: Insufficient documentation

## 2013-09-03 DIAGNOSIS — Z8739 Personal history of other diseases of the musculoskeletal system and connective tissue: Secondary | ICD-10-CM | POA: Insufficient documentation

## 2013-09-03 DIAGNOSIS — R079 Chest pain, unspecified: Secondary | ICD-10-CM | POA: Insufficient documentation

## 2013-09-03 DIAGNOSIS — R531 Weakness: Secondary | ICD-10-CM

## 2013-09-03 DIAGNOSIS — Z794 Long term (current) use of insulin: Secondary | ICD-10-CM | POA: Insufficient documentation

## 2013-09-03 DIAGNOSIS — G43909 Migraine, unspecified, not intractable, without status migrainosus: Secondary | ICD-10-CM | POA: Diagnosis not present

## 2013-09-03 DIAGNOSIS — H538 Other visual disturbances: Secondary | ICD-10-CM | POA: Insufficient documentation

## 2013-09-03 DIAGNOSIS — F209 Schizophrenia, unspecified: Secondary | ICD-10-CM | POA: Diagnosis not present

## 2013-09-03 DIAGNOSIS — R5383 Other fatigue: Principal | ICD-10-CM

## 2013-09-03 DIAGNOSIS — F411 Generalized anxiety disorder: Secondary | ICD-10-CM | POA: Insufficient documentation

## 2013-09-03 LAB — COMPREHENSIVE METABOLIC PANEL
ALBUMIN: 3.7 g/dL (ref 3.5–5.2)
ALT: 11 U/L (ref 0–53)
ANION GAP: 14 (ref 5–15)
AST: 12 U/L (ref 0–37)
Alkaline Phosphatase: 76 U/L (ref 39–117)
BILIRUBIN TOTAL: 0.5 mg/dL (ref 0.3–1.2)
BUN: 8 mg/dL (ref 6–23)
CHLORIDE: 95 meq/L — AB (ref 96–112)
CO2: 26 mEq/L (ref 19–32)
CREATININE: 0.96 mg/dL (ref 0.50–1.35)
Calcium: 8.6 mg/dL (ref 8.4–10.5)
GFR calc Af Amer: >90 mL/min (ref >90)
GFR calc non Af Amer: >90 mL/min (ref >90)
Glucose, Bld: 87 mg/dL (ref 70–99)
Potassium: 3.6 mEq/L — ABNORMAL LOW (ref 3.7–5.3)
Sodium: 135 mEq/L — ABNORMAL LOW (ref 137–147)
TOTAL PROTEIN: 7.4 g/dL (ref 6.0–8.3)

## 2013-09-03 LAB — CBG MONITORING, ED: Glucose-Capillary: 107 mg/dL — ABNORMAL HIGH (ref 70–99)

## 2013-09-03 LAB — CBC WITH DIFFERENTIAL/PLATELET
BASOS PCT: 0 % (ref 0–1)
Basophils Absolute: 0 10*3/uL (ref 0.0–0.1)
EOS ABS: 0.1 10*3/uL (ref 0.0–0.7)
EOS PCT: 1 % (ref 0–5)
HEMATOCRIT: 37.9 % — AB (ref 39.0–52.0)
HEMOGLOBIN: 12.5 g/dL — AB (ref 13.0–17.0)
Lymphocytes Relative: 15 % (ref 12–46)
Lymphs Abs: 1.6 10*3/uL (ref 0.7–4.0)
MCH: 27.1 pg (ref 26.0–34.0)
MCHC: 33 g/dL (ref 30.0–36.0)
MCV: 82.2 fL (ref 78.0–100.0)
MONO ABS: 1 10*3/uL (ref 0.1–1.0)
MONOS PCT: 9 % (ref 3–12)
Neutro Abs: 8 10*3/uL — ABNORMAL HIGH (ref 1.7–7.7)
Neutrophils Relative %: 75 % (ref 43–77)
Platelets: 293 10*3/uL (ref 150–400)
RBC: 4.61 MIL/uL (ref 4.22–5.81)
RDW: 14.7 % (ref 11.5–15.5)
WBC: 10.7 10*3/uL — ABNORMAL HIGH (ref 4.0–10.5)

## 2013-09-03 LAB — URINALYSIS, ROUTINE W REFLEX MICROSCOPIC
Bilirubin Urine: NEGATIVE
Glucose, UA: NEGATIVE mg/dL
Hgb urine dipstick: NEGATIVE
KETONES UR: NEGATIVE mg/dL
LEUKOCYTES UA: NEGATIVE
NITRITE: NEGATIVE
PH: 8 (ref 5.0–8.0)
Protein, ur: NEGATIVE mg/dL
Specific Gravity, Urine: 1.015 (ref 1.005–1.030)
Urobilinogen, UA: 1 mg/dL (ref 0.0–1.0)

## 2013-09-03 LAB — TROPONIN I: Troponin I: <0.3 ng/mL (ref <0.30)

## 2013-09-03 MED ORDER — SODIUM CHLORIDE 0.9 % IV SOLN
1000.0000 mL | Freq: Once | INTRAVENOUS | Status: AC
Start: 2013-09-03 — End: 2013-09-03
  Administered 2013-09-03: 1000 mL via INTRAVENOUS

## 2013-09-03 MED ORDER — OXYCODONE-ACETAMINOPHEN 5-325 MG PO TABS
1.0000 | ORAL_TABLET | Freq: Once | ORAL | Status: AC
  Administered 2013-09-03: 1 via ORAL
  Filled 2013-09-03: qty 1

## 2013-09-03 MED ORDER — SODIUM CHLORIDE 0.9 % IV SOLN: 1000.0000 mL | INTRAVENOUS | Status: DC

## 2013-09-03 NOTE — ED Notes (Signed)
Pt says he has a migraine, requesting something to relieve the pain, will notify RN.
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
## 2013-09-03 NOTE — ED Notes (Signed)
Patient is alert and oriented x3.  He was given DC instructions and follow up visit instructions.  Patient gave verbal understanding.  He was DC ambulatory under his own power to home.  V/S stable.  He was not showing any signs of distress on DC 

## 2013-09-03 NOTE — ED Notes (Signed)
Pt reports cp and bila side pain with weakness and fatigue since yesterday.  Pt also reports R vision changes, states blurred vision started yesterday.  Pt also reports h/a

## 2013-09-06 ENCOUNTER — Emergency Department (HOSPITAL_COMMUNITY)
Admission: EM | Admit: 2013-09-06 | Discharge: 2013-09-06 | Disposition: A | Discharge status: Home / Self Care | Payer: Medicaid Other | Attending: Emergency Medicine | Admitting: Emergency Medicine

## 2013-09-06 ENCOUNTER — Emergency Department (HOSPITAL_COMMUNITY): Payer: Medicaid Other

## 2013-09-06 ENCOUNTER — Encounter (HOSPITAL_COMMUNITY): Payer: Self-pay | Admitting: Emergency Medicine

## 2013-09-06 DIAGNOSIS — Z794 Long term (current) use of insulin: Secondary | ICD-10-CM | POA: Diagnosis not present

## 2013-09-06 DIAGNOSIS — F4310 Post-traumatic stress disorder, unspecified: Secondary | ICD-10-CM | POA: Diagnosis not present

## 2013-09-06 DIAGNOSIS — R059 Cough, unspecified: Secondary | ICD-10-CM | POA: Diagnosis not present

## 2013-09-06 DIAGNOSIS — E871 Hypo-osmolality and hyponatremia: Secondary | ICD-10-CM | POA: Diagnosis not present

## 2013-09-06 DIAGNOSIS — F329 Major depressive disorder, single episode, unspecified: Secondary | ICD-10-CM | POA: Diagnosis not present

## 2013-09-06 DIAGNOSIS — F411 Generalized anxiety disorder: Secondary | ICD-10-CM | POA: Diagnosis not present

## 2013-09-06 DIAGNOSIS — Z87820 Personal history of traumatic brain injury: Secondary | ICD-10-CM | POA: Diagnosis not present

## 2013-09-06 DIAGNOSIS — F209 Schizophrenia, unspecified: Secondary | ICD-10-CM | POA: Diagnosis not present

## 2013-09-06 DIAGNOSIS — K219 Gastro-esophageal reflux disease without esophagitis: Secondary | ICD-10-CM | POA: Insufficient documentation

## 2013-09-06 DIAGNOSIS — R0602 Shortness of breath: Secondary | ICD-10-CM | POA: Diagnosis not present

## 2013-09-06 DIAGNOSIS — IMO0002 Reserved for concepts with insufficient information to code with codable children: Secondary | ICD-10-CM | POA: Diagnosis not present

## 2013-09-06 DIAGNOSIS — R5381 Other malaise: Secondary | ICD-10-CM | POA: Insufficient documentation

## 2013-09-06 DIAGNOSIS — Z79899 Other long term (current) drug therapy: Secondary | ICD-10-CM | POA: Diagnosis not present

## 2013-09-06 DIAGNOSIS — R112 Nausea with vomiting, unspecified: Secondary | ICD-10-CM | POA: Diagnosis not present

## 2013-09-06 DIAGNOSIS — R0789 Other chest pain: Secondary | ICD-10-CM | POA: Insufficient documentation

## 2013-09-06 DIAGNOSIS — R109 Unspecified abdominal pain: Secondary | ICD-10-CM | POA: Diagnosis not present

## 2013-09-06 DIAGNOSIS — G43909 Migraine, unspecified, not intractable, without status migrainosus: Secondary | ICD-10-CM | POA: Insufficient documentation

## 2013-09-06 DIAGNOSIS — Z7982 Long term (current) use of aspirin: Secondary | ICD-10-CM | POA: Diagnosis not present

## 2013-09-06 DIAGNOSIS — F3289 Other specified depressive episodes: Secondary | ICD-10-CM | POA: Insufficient documentation

## 2013-09-06 DIAGNOSIS — R197 Diarrhea, unspecified: Secondary | ICD-10-CM | POA: Diagnosis not present

## 2013-09-06 DIAGNOSIS — R42 Dizziness and giddiness: Secondary | ICD-10-CM | POA: Diagnosis not present

## 2013-09-06 DIAGNOSIS — L97509 Non-pressure chronic ulcer of other part of unspecified foot with unspecified severity: Secondary | ICD-10-CM | POA: Diagnosis not present

## 2013-09-06 DIAGNOSIS — R531 Weakness: Secondary | ICD-10-CM

## 2013-09-06 DIAGNOSIS — R05 Cough: Secondary | ICD-10-CM | POA: Insufficient documentation

## 2013-09-06 DIAGNOSIS — R5383 Other fatigue: Principal | ICD-10-CM

## 2013-09-06 DIAGNOSIS — E1169 Type 2 diabetes mellitus with other specified complication: Secondary | ICD-10-CM | POA: Insufficient documentation

## 2013-09-06 LAB — CBC WITH DIFFERENTIAL/PLATELET
Basophils Absolute: 0 10*3/uL (ref 0.0–0.1)
Basophils Relative: 0 % (ref 0–1)
EOS ABS: 0 10*3/uL (ref 0.0–0.7)
Eosinophils Relative: 0 % (ref 0–5)
HCT: 31.4 % — ABNORMAL LOW (ref 39.0–52.0)
Hemoglobin: 10.6 g/dL — ABNORMAL LOW (ref 13.0–17.0)
LYMPHS PCT: 5 % — AB (ref 12–46)
Lymphs Abs: 0.8 10*3/uL (ref 0.7–4.0)
MCH: 27 pg (ref 26.0–34.0)
MCHC: 33.8 g/dL (ref 30.0–36.0)
MCV: 80.1 fL (ref 78.0–100.0)
MONOS PCT: 7 % (ref 3–12)
Monocytes Absolute: 1.2 10*3/uL — ABNORMAL HIGH (ref 0.1–1.0)
NEUTROS ABS: 15.9 10*3/uL — AB (ref 1.7–7.7)
Neutrophils Relative %: 88 % — ABNORMAL HIGH (ref 43–77)
PLATELETS: 329 10*3/uL (ref 150–400)
RBC: 3.92 MIL/uL — ABNORMAL LOW (ref 4.22–5.81)
RDW: 14.6 % (ref 11.5–15.5)
WBC: 18 10*3/uL — AB (ref 4.0–10.5)

## 2013-09-06 LAB — RAPID URINE DRUG SCREEN, HOSP PERFORMED
AMPHETAMINES: NOT DETECTED
Barbiturates: NOT DETECTED
Benzodiazepines: NOT DETECTED
COCAINE: NOT DETECTED
OPIATES: NOT DETECTED
Tetrahydrocannabinol: NOT DETECTED

## 2013-09-06 LAB — URINE MICROSCOPIC-ADD ON

## 2013-09-06 LAB — URINALYSIS, ROUTINE W REFLEX MICROSCOPIC
Bilirubin Urine: NEGATIVE
GLUCOSE, UA: 500 mg/dL — AB
HGB URINE DIPSTICK: NEGATIVE
Ketones, ur: NEGATIVE mg/dL
Leukocytes, UA: NEGATIVE
Nitrite: NEGATIVE
PROTEIN: 100 mg/dL — AB
Specific Gravity, Urine: 1.022 (ref 1.005–1.030)
Urobilinogen, UA: 4 mg/dL — ABNORMAL HIGH (ref 0.0–1.0)
pH: 7.5 (ref 5.0–8.0)

## 2013-09-06 LAB — COMPREHENSIVE METABOLIC PANEL
ALK PHOS: 68 U/L (ref 39–117)
ALT: 11 U/L (ref 0–53)
ANION GAP: 16 — AB (ref 5–15)
AST: 31 U/L (ref 0–37)
Albumin: 3 g/dL — ABNORMAL LOW (ref 3.5–5.2)
BILIRUBIN TOTAL: 1 mg/dL (ref 0.3–1.2)
BUN: 9 mg/dL (ref 6–23)
CHLORIDE: 93 meq/L — AB (ref 96–112)
CO2: 21 meq/L (ref 19–32)
Calcium: 8.5 mg/dL (ref 8.4–10.5)
Creatinine, Ser: 0.98 mg/dL (ref 0.50–1.35)
GFR calc non Af Amer: >90 mL/min (ref >90)
GLUCOSE: 81 mg/dL (ref 70–99)
Potassium: 4.4 mEq/L (ref 3.7–5.3)
SODIUM: 130 meq/L — AB (ref 137–147)
TOTAL PROTEIN: 6.9 g/dL (ref 6.0–8.3)

## 2013-09-06 LAB — TROPONIN I: Troponin I: <0.3 ng/mL (ref <0.30)

## 2013-09-06 LAB — ETHANOL: Alcohol, Ethyl (B): <11 mg/dL (ref 0–11)

## 2013-09-06 MED ORDER — SODIUM CHLORIDE 0.9 % IV BOLUS (SEPSIS)
1000.0000 mL | Freq: Once | INTRAVENOUS | Status: AC
  Administered 2013-09-06: 1000 mL via INTRAVENOUS

## 2013-09-06 MED ORDER — IOHEXOL 300 MG/ML  SOLN
100.0000 mL | Freq: Once | INTRAMUSCULAR | Status: AC | PRN
Start: 2013-09-06 — End: 2013-09-06
  Administered 2013-09-06: 100 mL via INTRAVENOUS

## 2013-09-06 MED ORDER — DIPHENHYDRAMINE HCL 50 MG/ML IJ SOLN
25.0000 mg | Freq: Once | INTRAMUSCULAR | Status: AC
  Administered 2013-09-06: 25 mg via INTRAVENOUS
  Filled 2013-09-06: qty 1

## 2013-09-06 MED ORDER — METOCLOPRAMIDE HCL 5 MG/ML IJ SOLN
10.0000 mg | Freq: Once | INTRAMUSCULAR | Status: AC
  Administered 2013-09-06: 10 mg via INTRAVENOUS
  Filled 2013-09-06: qty 2

## 2013-09-06 MED ORDER — PROCHLORPERAZINE EDISYLATE 5 MG/ML IJ SOLN
10.0000 mg | Freq: Once | INTRAMUSCULAR | Status: AC
  Administered 2013-09-06: 10 mg via INTRAVENOUS
  Filled 2013-09-06: qty 2

## 2013-09-06 MED ORDER — IOHEXOL 300 MG/ML  SOLN
50.0000 mL | Freq: Once | INTRAMUSCULAR | Status: AC | PRN
  Administered 2013-09-06: 50 mL via ORAL

## 2013-09-06 NOTE — Discharge Instructions (Signed)
Please follow up with your primary care physician in 1-2 days. If you do not have one please call the [facility] and wellness Center number listed above. Please continue to take your at home medications as prescribed. Please read all discharge instructions and return precautions.    Fatigue Fatigue is a feeling of tiredness, lack of energy, lack of motivation, or feeling tired all the time. Having enough rest, good nutrition, and reducing stress will normally reduce fatigue. Consult your caregiver if it persists. The nature of your fatigue will help your caregiver to find out its cause. The treatment is based on the cause.  CAUSES  There are many causes for fatigue. Most of the time, fatigue can be traced to one or more of your habits or routines. Most causes fit into one or more of three general areas. They are: Lifestyle problems  Sleep disturbances.  Overwork.  Physical exertion.  Unhealthy habits.  Poor eating habits or eating disorders.  Alcohol and/or drug use .  Lack of proper nutrition (malnutrition). Psychological problems  Stress and/or anxiety problems.  Depression.  Grief.  Boredom. Medical Problems or Conditions  Anemia.  Pregnancy.  Thyroid gland problems.  Recovery from major surgery.  Continuous pain.  Emphysema or asthma that is not well controlled  Allergic conditions.  Diabetes.  Infections (such as mononucleosis).  Obesity.  Sleep disorders, such as sleep apnea.  Heart failure or other heart-related problems.  Cancer.  Kidney disease.  Liver disease.  Effects of certain medicines such as antihistamines, cough and cold remedies, prescription pain medicines, heart and blood pressure medicines, drugs used for treatment of cancer, and some antidepressants. SYMPTOMS  The symptoms of fatigue include:   Lack of energy.  Lack of drive (motivation).  Drowsiness.  Feeling of indifference to the surroundings. DIAGNOSIS  The details  of how you feel help guide your caregiver in finding out what is causing the fatigue. You will be asked about your present and past health condition. It is important to review all medicines that you take, including prescription and non-prescription items. A thorough exam will be done. You will be questioned about your feelings, habits, and normal lifestyle. Your caregiver may suggest blood tests, urine tests, or other tests to look for common medical causes of fatigue.  TREATMENT  Fatigue is treated by correcting the underlying cause. For example, if you have continuous pain or depression, treating these causes will improve how you feel. Similarly, adjusting the dose of certain medicines will help in reducing fatigue.  HOME CARE INSTRUCTIONS   Try to get the required amount of good sleep every night.  Eat a healthy and nutritious diet, and drink enough water throughout the day.  Practice ways of relaxing (including yoga or meditation).  Exercise regularly.  Make plans to change situations that cause stress. Act on those plans so that stresses decrease over time. Keep your work and personal routine reasonable.  Avoid street drugs and minimize use of alcohol.  Start taking a daily multivitamin after consulting your caregiver. SEEK MEDICAL CARE IF:   You have persistent tiredness, which cannot be accounted for.  You have fever.  You have unintentional weight loss.  You have headaches.  You have disturbed sleep throughout the night.  You are feeling sad.  You have constipation.  You have dry skin.  You have gained weight.  You are taking any new or different medicines that you suspect are causing fatigue.  You are unable to sleep at night.  You develop any unusual swelling of your legs or other parts of your body. SEEK IMMEDIATE MEDICAL CARE IF:   You are feeling confused.  Your vision is blurred.  You feel faint or pass out.  You develop severe headache.  You develop  severe abdominal, pelvic, or back pain.  You develop chest pain, shortness of breath, or an irregular or fast heartbeat.  You are unable to pass a normal amount of urine.  You develop abnormal bleeding such as bleeding from the rectum or you vomit blood.  You have thoughts about harming yourself or committing suicide.  You are worried that you might harm someone else. MAKE SURE YOU:   Understand these instructions.  Will watch your condition.  Will get help right away if you are not doing well or get worse. Document Released: 11/28/2006 Document Revised: 04/25/2011 Document Reviewed: 06/04/2013 [employer] Patient Information 2015 [employer], [place]. This information is not intended to replace advice given to you by your health care provider. Make sure you discuss any questions you have with your health care provider.  Hyponatremia  Hyponatremia is when the amount of salt (sodium) in your blood is too low. When sodium levels are low, your cells will absorb extra water and swell. The swelling happens throughout the body, but it mostly affects the brain. Severe brain swelling (cerebral edema), seizures, or coma can happen.  CAUSES   Heart, kidney, or liver problems.  Thyroid problems.  Adrenal gland problems.  Severe vomiting and diarrhea.  Certain medicines or illegal drugs.  Dehydration.  Drinking too much water.  Low-sodium diet. SYMPTOMS   Nausea and vomiting.  Confusion.  Lethargy.  Agitation.  Headache.  Twitching or shaking (seizures).  Unconsciousness.  Appetite loss.  Muscle weakness and cramping. DIAGNOSIS  Hyponatremia is identified by a simple blood test. Your caregiver will perform a history and physical exam to try to find the cause and type of hyponatremia. Other tests may be needed to measure the amount of sodium in your blood and urine. TREATMENT  Treatment will depend on the cause.   Fluids may be given through the vein (IV).  Medicines may  be used to correct the sodium imbalance. If medicines are causing the problem, they will need to be adjusted.  Water or fluid intake may be restricted to restore proper balance. The speed of correcting the sodium problem is very important. If the problem is corrected too fast, nerve damage (sometimes unchangeable) can happen. HOME CARE INSTRUCTIONS   Only take medicines as directed by your caregiver. Many medicines can make hyponatremia worse. Discuss all your medicines with your caregiver.  Carefully follow any recommended diet, including any fluid restrictions.  You may be asked to repeat lab tests. Follow these directions.  Avoid alcohol and recreational drugs. SEEK MEDICAL CARE IF:   You develop worsening nausea, fatigue, headache, confusion, or weakness.  Your original hyponatremia symptoms return.  You have problems following the recommended diet. SEEK IMMEDIATE MEDICAL CARE IF:   You have a seizure.  You faint.  You have ongoing diarrhea or vomiting. MAKE SURE YOU:   Understand these instructions.  Will watch your condition.  Will get help right away if you are not doing well or get worse. Document Released: 01/21/2002 Document Revised: 04/25/2011 Document Reviewed: 07/18/2010 [facility] Patient Information 2015 [employer], [place]. This information is not intended to replace advice given to you by your health care provider. Make sure you discuss any questions you have with your health care provider.
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
## 2013-09-06 NOTE — ED Notes (Signed)
Per pt report: pt reports having blurred vision, vertigo.  Pt reports that at times, pt's legs give out on him.  Pt unable to tolerate foods but is able to tolerate PO fluids. Pt reports just "not feeling well."  Pt had a fever of 101 on Wednesday and took tylenol for it in which the fever resolved.

## 2013-09-06 NOTE — ED Notes (Signed)
Patient transported to CT 

## 2013-09-08 ENCOUNTER — Emergency Department (HOSPITAL_COMMUNITY): Payer: Medicaid Other

## 2013-09-08 ENCOUNTER — Encounter (HOSPITAL_COMMUNITY): Payer: Self-pay | Admitting: Emergency Medicine

## 2013-09-08 ENCOUNTER — Inpatient Hospital Stay (HOSPITAL_COMMUNITY)
Admission: EM | Admit: 2013-09-08 | Discharge: 2013-09-13 | DRG: 616 | Disposition: A | Discharge status: Skilled Nursing Facility | Payer: Medicaid Other | Attending: Internal Medicine | Admitting: Internal Medicine

## 2013-09-08 DIAGNOSIS — F411 Generalized anxiety disorder: Secondary | ICD-10-CM | POA: Diagnosis present

## 2013-09-08 DIAGNOSIS — IMO0002 Reserved for concepts with insufficient information to code with codable children: Secondary | ICD-10-CM | POA: Diagnosis not present

## 2013-09-08 DIAGNOSIS — F209 Schizophrenia, unspecified: Secondary | ICD-10-CM | POA: Diagnosis present

## 2013-09-08 DIAGNOSIS — Z803 Family history of malignant neoplasm of breast: Secondary | ICD-10-CM

## 2013-09-08 DIAGNOSIS — A419 Sepsis, unspecified organism: Secondary | ICD-10-CM | POA: Diagnosis not present

## 2013-09-08 DIAGNOSIS — R51 Headache: Secondary | ICD-10-CM

## 2013-09-08 DIAGNOSIS — M79609 Pain in unspecified limb: Secondary | ICD-10-CM | POA: Diagnosis present

## 2013-09-08 DIAGNOSIS — L03119 Cellulitis of unspecified part of limb: Secondary | ICD-10-CM

## 2013-09-08 DIAGNOSIS — Z886 Allergy status to analgesic agent status: Secondary | ICD-10-CM | POA: Diagnosis not present

## 2013-09-08 DIAGNOSIS — Z801 Family history of malignant neoplasm of trachea, bronchus and lung: Secondary | ICD-10-CM

## 2013-09-08 DIAGNOSIS — G43909 Migraine, unspecified, not intractable, without status migrainosus: Secondary | ICD-10-CM | POA: Diagnosis present

## 2013-09-08 DIAGNOSIS — D638 Anemia in other chronic diseases classified elsewhere: Secondary | ICD-10-CM | POA: Diagnosis present

## 2013-09-08 DIAGNOSIS — E1142 Type 2 diabetes mellitus with diabetic polyneuropathy: Secondary | ICD-10-CM | POA: Diagnosis present

## 2013-09-08 DIAGNOSIS — Z79899 Other long term (current) drug therapy: Secondary | ICD-10-CM | POA: Diagnosis not present

## 2013-09-08 DIAGNOSIS — R531 Weakness: Secondary | ICD-10-CM

## 2013-09-08 DIAGNOSIS — S88919A Complete traumatic amputation of unspecified lower leg, level unspecified, initial encounter: Secondary | ICD-10-CM | POA: Diagnosis not present

## 2013-09-08 DIAGNOSIS — G4733 Obstructive sleep apnea (adult) (pediatric): Secondary | ICD-10-CM | POA: Diagnosis present

## 2013-09-08 DIAGNOSIS — E1149 Type 2 diabetes mellitus with other diabetic neurological complication: Secondary | ICD-10-CM | POA: Diagnosis present

## 2013-09-08 DIAGNOSIS — L97529 Non-pressure chronic ulcer of other part of left foot with unspecified severity: Secondary | ICD-10-CM

## 2013-09-08 DIAGNOSIS — E11621 Type 2 diabetes mellitus with foot ulcer: Secondary | ICD-10-CM

## 2013-09-08 DIAGNOSIS — H40009 Preglaucoma, unspecified, unspecified eye: Secondary | ICD-10-CM | POA: Diagnosis not present

## 2013-09-08 DIAGNOSIS — Z87820 Personal history of traumatic brain injury: Secondary | ICD-10-CM

## 2013-09-08 DIAGNOSIS — L97509 Non-pressure chronic ulcer of other part of unspecified foot with unspecified severity: Secondary | ICD-10-CM | POA: Diagnosis present

## 2013-09-08 DIAGNOSIS — K219 Gastro-esophageal reflux disease without esophagitis: Secondary | ICD-10-CM | POA: Diagnosis present

## 2013-09-08 DIAGNOSIS — M9080 Osteopathy in diseases classified elsewhere, unspecified site: Secondary | ICD-10-CM | POA: Diagnosis present

## 2013-09-08 DIAGNOSIS — E1140 Type 2 diabetes mellitus with diabetic neuropathy, unspecified: Secondary | ICD-10-CM | POA: Diagnosis present

## 2013-09-08 DIAGNOSIS — F329 Major depressive disorder, single episode, unspecified: Secondary | ICD-10-CM | POA: Diagnosis not present

## 2013-09-08 DIAGNOSIS — F4310 Post-traumatic stress disorder, unspecified: Secondary | ICD-10-CM | POA: Diagnosis present

## 2013-09-08 DIAGNOSIS — M86179 Other acute osteomyelitis, unspecified ankle and foot: Secondary | ICD-10-CM | POA: Diagnosis present

## 2013-09-08 DIAGNOSIS — F3289 Other specified depressive episodes: Secondary | ICD-10-CM | POA: Diagnosis not present

## 2013-09-08 DIAGNOSIS — R4182 Altered mental status, unspecified: Secondary | ICD-10-CM | POA: Diagnosis present

## 2013-09-08 DIAGNOSIS — E1159 Type 2 diabetes mellitus with other circulatory complications: Secondary | ICD-10-CM | POA: Diagnosis present

## 2013-09-08 DIAGNOSIS — E1169 Type 2 diabetes mellitus with other specified complication: Secondary | ICD-10-CM | POA: Diagnosis present

## 2013-09-08 DIAGNOSIS — E271 Primary adrenocortical insufficiency: Secondary | ICD-10-CM | POA: Diagnosis present

## 2013-09-08 DIAGNOSIS — M869 Osteomyelitis, unspecified: Secondary | ICD-10-CM | POA: Diagnosis present

## 2013-09-08 DIAGNOSIS — E871 Hypo-osmolality and hyponatremia: Secondary | ICD-10-CM | POA: Diagnosis present

## 2013-09-08 DIAGNOSIS — Z833 Family history of diabetes mellitus: Secondary | ICD-10-CM

## 2013-09-08 DIAGNOSIS — L02612 Cutaneous abscess of left foot: Secondary | ICD-10-CM

## 2013-09-08 DIAGNOSIS — Z794 Long term (current) use of insulin: Secondary | ICD-10-CM | POA: Diagnosis not present

## 2013-09-08 DIAGNOSIS — R109 Unspecified abdominal pain: Secondary | ICD-10-CM | POA: Diagnosis not present

## 2013-09-08 DIAGNOSIS — Z872 Personal history of diseases of the skin and subcutaneous tissue: Secondary | ICD-10-CM | POA: Diagnosis not present

## 2013-09-08 DIAGNOSIS — R509 Fever, unspecified: Secondary | ICD-10-CM | POA: Diagnosis not present

## 2013-09-08 DIAGNOSIS — E876 Hypokalemia: Secondary | ICD-10-CM | POA: Diagnosis not present

## 2013-09-08 DIAGNOSIS — A4901 Methicillin susceptible Staphylococcus aureus infection, unspecified site: Secondary | ICD-10-CM

## 2013-09-08 DIAGNOSIS — I70269 Atherosclerosis of native arteries of extremities with gangrene, unspecified extremity: Secondary | ICD-10-CM | POA: Diagnosis present

## 2013-09-08 DIAGNOSIS — R0602 Shortness of breath: Secondary | ICD-10-CM | POA: Diagnosis not present

## 2013-09-08 DIAGNOSIS — R197 Diarrhea, unspecified: Secondary | ICD-10-CM | POA: Diagnosis not present

## 2013-09-08 DIAGNOSIS — Z9981 Dependence on supplemental oxygen: Secondary | ICD-10-CM | POA: Diagnosis not present

## 2013-09-08 DIAGNOSIS — R110 Nausea: Secondary | ICD-10-CM | POA: Diagnosis not present

## 2013-09-08 DIAGNOSIS — E2749 Other adrenocortical insufficiency: Secondary | ICD-10-CM | POA: Diagnosis present

## 2013-09-08 DIAGNOSIS — E119 Type 2 diabetes mellitus without complications: Secondary | ICD-10-CM | POA: Diagnosis not present

## 2013-09-08 LAB — COMPREHENSIVE METABOLIC PANEL
ALK PHOS: 77 U/L (ref 39–117)
ALT: 31 U/L (ref 0–53)
ANION GAP: 16 — AB (ref 5–15)
AST: 43 U/L — ABNORMAL HIGH (ref 0–37)
Albumin: 2.5 g/dL — ABNORMAL LOW (ref 3.5–5.2)
BUN: 11 mg/dL (ref 6–23)
CO2: 21 mEq/L (ref 19–32)
CREATININE: 0.93 mg/dL (ref 0.50–1.35)
Calcium: 7.9 mg/dL — ABNORMAL LOW (ref 8.4–10.5)
Chloride: 90 mEq/L — ABNORMAL LOW (ref 96–112)
GFR calc non Af Amer: >90 mL/min (ref >90)
GLUCOSE: 146 mg/dL — AB (ref 70–99)
Potassium: 3 mEq/L — ABNORMAL LOW (ref 3.7–5.3)
Sodium: 127 mEq/L — ABNORMAL LOW (ref 137–147)
Total Bilirubin: 0.8 mg/dL (ref 0.3–1.2)
Total Protein: 6.2 g/dL (ref 6.0–8.3)

## 2013-09-08 LAB — CBC WITH DIFFERENTIAL/PLATELET
BASOS PCT: 0 % (ref 0–1)
Basophils Absolute: 0 10*3/uL (ref 0.0–0.1)
Eosinophils Absolute: 0 10*3/uL (ref 0.0–0.7)
Eosinophils Relative: 0 % (ref 0–5)
HEMATOCRIT: 26.7 % — AB (ref 39.0–52.0)
HEMOGLOBIN: 9.4 g/dL — AB (ref 13.0–17.0)
LYMPHS ABS: 0.7 10*3/uL (ref 0.7–4.0)
Lymphocytes Relative: 4 % — ABNORMAL LOW (ref 12–46)
MCH: 27.2 pg (ref 26.0–34.0)
MCHC: 35.2 g/dL (ref 30.0–36.0)
MCV: 77.2 fL — ABNORMAL LOW (ref 78.0–100.0)
MONO ABS: 0.7 10*3/uL (ref 0.1–1.0)
Monocytes Relative: 4 % (ref 3–12)
Neutro Abs: 15 10*3/uL — ABNORMAL HIGH (ref 1.7–7.7)
Neutrophils Relative %: 92 % — ABNORMAL HIGH (ref 43–77)
Platelets: 282 10*3/uL (ref 150–400)
RBC: 3.46 MIL/uL — ABNORMAL LOW (ref 4.22–5.81)
RDW: 14.2 % (ref 11.5–15.5)
WBC: 16.4 10*3/uL — ABNORMAL HIGH (ref 4.0–10.5)

## 2013-09-08 LAB — I-STAT CG4 LACTIC ACID, ED: Lactic Acid, Venous: 1 mmol/L (ref 0.5–2.2)

## 2013-09-08 LAB — LIPASE, BLOOD: LIPASE: 11 U/L (ref 11–59)

## 2013-09-08 LAB — TROPONIN I: Troponin I: <0.3 ng/mL (ref <0.30)

## 2013-09-08 MED ORDER — VANCOMYCIN HCL IN DEXTROSE 1-5 GM/200ML-% IV SOLN
1000.0000 mg | Freq: Once | INTRAVENOUS | Status: AC
  Administered 2013-09-08: 1000 mg via INTRAVENOUS
  Filled 2013-09-08: qty 200

## 2013-09-08 MED ORDER — SODIUM CHLORIDE 0.9 % IV BOLUS (SEPSIS)
1000.0000 mL | Freq: Once | INTRAVENOUS | Status: AC
  Administered 2013-09-08: 1000 mL via INTRAVENOUS

## 2013-09-08 MED ORDER — METHYLPREDNISOLONE SODIUM SUCC 125 MG IJ SOLR
125.0000 mg | Freq: Once | INTRAMUSCULAR | Status: AC
  Administered 2013-09-08: 125 mg via INTRAVENOUS
  Filled 2013-09-08: qty 2

## 2013-09-08 NOTE — ED Provider Notes (Signed)
Medical screening examination/treatment/procedure(s) were performed by non-physician practitioner and as supervising physician I was immediately available for consultation/collaboration.   EKG Interpretation None        Ivis Henneman, MD 09/08/13 0711 

## 2013-09-08 NOTE — ED Notes (Signed)
Pt comes from home and states he pass a stone out of his rectum (unwitnessed). Pt has altered mental status unknown if this is pt baseline. Pt is alert to person, time and situation. Pt states he lives alone for now. Pt uses wheelchair. Pt states he has an infection in his foot and reports he feels like he did the last time when his sodium was low.
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
## 2013-09-08 NOTE — ED Notes (Signed)
Bed: WA03 Expected date:  Expected time:  Means of arrival:  Comments: EMS 57yo M N/V/D, gen weakness, febrile

## 2013-09-09 ENCOUNTER — Encounter (HOSPITAL_COMMUNITY): Payer: Self-pay | Admitting: *Deleted

## 2013-09-09 DIAGNOSIS — E871 Hypo-osmolality and hyponatremia: Secondary | ICD-10-CM

## 2013-09-09 DIAGNOSIS — M9080 Osteopathy in diseases classified elsewhere, unspecified site: Secondary | ICD-10-CM

## 2013-09-09 DIAGNOSIS — E1169 Type 2 diabetes mellitus with other specified complication: Principal | ICD-10-CM

## 2013-09-09 DIAGNOSIS — M869 Osteomyelitis, unspecified: Secondary | ICD-10-CM

## 2013-09-09 DIAGNOSIS — F4310 Post-traumatic stress disorder, unspecified: Secondary | ICD-10-CM | POA: Diagnosis present

## 2013-09-09 DIAGNOSIS — E2749 Other adrenocortical insufficiency: Secondary | ICD-10-CM

## 2013-09-09 DIAGNOSIS — L97509 Non-pressure chronic ulcer of other part of unspecified foot with unspecified severity: Secondary | ICD-10-CM

## 2013-09-09 DIAGNOSIS — E11621 Type 2 diabetes mellitus with foot ulcer: Secondary | ICD-10-CM | POA: Diagnosis present

## 2013-09-09 LAB — HEMOGLOBIN A1C
Hgb A1c MFr Bld: 8 % — ABNORMAL HIGH (ref <5.7)
MEAN PLASMA GLUCOSE: 183 mg/dL — AB (ref <117)

## 2013-09-09 LAB — BASIC METABOLIC PANEL
Anion gap: 20 — ABNORMAL HIGH (ref 5–15)
BUN: 11 mg/dL (ref 6–23)
CO2: 18 meq/L — AB (ref 19–32)
CREATININE: 0.79 mg/dL (ref 0.50–1.35)
Calcium: 7.8 mg/dL — ABNORMAL LOW (ref 8.4–10.5)
Chloride: 89 mEq/L — ABNORMAL LOW (ref 96–112)
GFR calc Af Amer: >90 mL/min (ref >90)
GLUCOSE: 202 mg/dL — AB (ref 70–99)
Potassium: 3.3 mEq/L — ABNORMAL LOW (ref 3.7–5.3)
SODIUM: 127 meq/L — AB (ref 137–147)

## 2013-09-09 LAB — CBC
HEMATOCRIT: 27.3 % — AB (ref 39.0–52.0)
Hemoglobin: 9.6 g/dL — ABNORMAL LOW (ref 13.0–17.0)
MCH: 27 pg (ref 26.0–34.0)
MCHC: 35.2 g/dL (ref 30.0–36.0)
MCV: 76.9 fL — AB (ref 78.0–100.0)
PLATELETS: 283 10*3/uL (ref 150–400)
RBC: 3.55 MIL/uL — ABNORMAL LOW (ref 4.22–5.81)
RDW: 14.2 % (ref 11.5–15.5)
WBC: 16.4 10*3/uL — ABNORMAL HIGH (ref 4.0–10.5)

## 2013-09-09 LAB — SODIUM, URINE, RANDOM: SODIUM UR: 19 meq/L

## 2013-09-09 LAB — MRSA PCR SCREENING: MRSA by PCR: NEGATIVE

## 2013-09-09 LAB — SEDIMENTATION RATE: Sed Rate: 64 mm/hr — ABNORMAL HIGH (ref 0–16)

## 2013-09-09 LAB — GLUCOSE, CAPILLARY: Glucose-Capillary: 308 mg/dL — ABNORMAL HIGH (ref 70–99)

## 2013-09-09 LAB — OSMOLALITY: Osmolality: 281 mOsm/kg (ref 275–300)

## 2013-09-09 LAB — OSMOLALITY, URINE: OSMOLALITY UR: 395 mosm/kg (ref 390–1090)

## 2013-09-09 MED ORDER — INSULIN ASPART 100 UNIT/ML ~~LOC~~ SOLN
0.0000 [IU] | Freq: Three times a day (TID) | SUBCUTANEOUS | Status: DC
  Administered 2013-09-09 (×2): 7 [IU] via SUBCUTANEOUS
  Administered 2013-09-09: 5 [IU] via SUBCUTANEOUS
  Administered 2013-09-10: 2 [IU] via SUBCUTANEOUS
  Administered 2013-09-10: 7 [IU] via SUBCUTANEOUS
  Administered 2013-09-10: 2 [IU] via SUBCUTANEOUS
  Administered 2013-09-11 – 2013-09-13 (×3): 1 [IU] via SUBCUTANEOUS

## 2013-09-09 MED ORDER — DIPHENHYDRAMINE HCL 25 MG PO CAPS: 25.0000 mg | ORAL_CAPSULE | Freq: Every evening | ORAL | Status: DC | PRN

## 2013-09-09 MED ORDER — ONDANSETRON HCL 4 MG PO TABS: 4.0000 mg | ORAL_TABLET | Freq: Four times a day (QID) | ORAL | Status: DC | PRN

## 2013-09-09 MED ORDER — ACETAMINOPHEN 650 MG RE SUPP: 650.0000 mg | Freq: Four times a day (QID) | RECTAL | Status: DC | PRN

## 2013-09-09 MED ORDER — PIPERACILLIN-TAZOBACTAM 3.375 G IVPB
3.3750 g | Freq: Three times a day (TID) | INTRAVENOUS | Status: DC
  Administered 2013-09-09 – 2013-09-13 (×13): 3.375 g via INTRAVENOUS
  Filled 2013-09-09 (×17): qty 50

## 2013-09-09 MED ORDER — ONDANSETRON HCL 4 MG/2ML IJ SOLN
4.0000 mg | Freq: Four times a day (QID) | INTRAMUSCULAR | Status: DC | PRN
  Administered 2013-09-09 – 2013-09-12 (×6): 4 mg via INTRAVENOUS
  Filled 2013-09-09 (×7): qty 2

## 2013-09-09 MED ORDER — SODIUM CHLORIDE 0.9 % IV SOLN: INTRAVENOUS | Status: DC

## 2013-09-09 MED ORDER — HYDROCORTISONE NA SUCCINATE PF 100 MG IJ SOLR
50.0000 mg | Freq: Three times a day (TID) | INTRAMUSCULAR | Status: DC
  Administered 2013-09-09 (×2): 50 mg via INTRAVENOUS
  Filled 2013-09-09 (×5): qty 1

## 2013-09-09 MED ORDER — HEPARIN SODIUM (PORCINE) 5000 UNIT/ML IJ SOLN
5000.0000 [IU] | Freq: Three times a day (TID) | INTRAMUSCULAR | Status: DC
  Filled 2013-09-09: qty 1

## 2013-09-09 MED ORDER — SODIUM CHLORIDE 0.9 % IV SOLN
INTRAVENOUS | Status: DC
  Administered 2013-09-09: 01:00:00 via INTRAVENOUS

## 2013-09-09 MED ORDER — FLUOXETINE HCL 20 MG PO CAPS
60.0000 mg | ORAL_CAPSULE | Freq: Every day | ORAL | Status: DC
  Administered 2013-09-09 – 2013-09-13 (×5): 60 mg via ORAL
  Filled 2013-09-09 (×7): qty 3

## 2013-09-09 MED ORDER — FLUDROCORTISONE ACETATE 0.1 MG PO TABS
0.2000 mg | ORAL_TABLET | Freq: Every day | ORAL | Status: DC
Start: 2013-09-09 — End: 2013-09-13
  Administered 2013-09-09 – 2013-09-13 (×5): 0.2 mg via ORAL
  Filled 2013-09-09 (×5): qty 2

## 2013-09-09 MED ORDER — PANTOPRAZOLE SODIUM 40 MG PO TBEC
40.0000 mg | DELAYED_RELEASE_TABLET | Freq: Every day | ORAL | Status: DC
  Administered 2013-09-09 – 2013-09-13 (×5): 40 mg via ORAL
  Filled 2013-09-09 (×5): qty 1

## 2013-09-09 MED ORDER — LORAZEPAM 2 MG/ML IJ SOLN: 1.0000 mg | Freq: Four times a day (QID) | INTRAMUSCULAR | Status: DC | PRN

## 2013-09-09 MED ORDER — MORPHINE SULFATE 2 MG/ML IJ SOLN
1.0000 mg | INTRAMUSCULAR | Status: DC | PRN
  Administered 2013-09-09 – 2013-09-10 (×8): 1 mg via INTRAVENOUS
  Filled 2013-09-09 (×9): qty 1

## 2013-09-09 MED ORDER — INSULIN GLARGINE 100 UNIT/ML ~~LOC~~ SOLN
15.0000 [IU] | Freq: Every day | SUBCUTANEOUS | Status: DC
  Administered 2013-09-09: 15 [IU] via SUBCUTANEOUS
  Filled 2013-09-09: qty 0.15

## 2013-09-09 MED ORDER — SODIUM CHLORIDE 0.9 % IV SOLN
INTRAVENOUS | Status: DC
  Administered 2013-09-09 – 2013-09-10 (×3): via INTRAVENOUS
  Filled 2013-09-09 (×6): qty 1000

## 2013-09-09 MED ORDER — PREGABALIN 75 MG PO CAPS
75.0000 mg | ORAL_CAPSULE | Freq: Three times a day (TID) | ORAL | Status: DC
  Administered 2013-09-09 – 2013-09-13 (×11): 75 mg via ORAL
  Filled 2013-09-09 (×11): qty 1

## 2013-09-09 MED ORDER — CLONAZEPAM 0.5 MG PO TABS
0.5000 mg | ORAL_TABLET | Freq: Three times a day (TID) | ORAL | Status: DC
  Administered 2013-09-09 – 2013-09-13 (×11): 0.5 mg via ORAL
  Filled 2013-09-09 (×11): qty 1

## 2013-09-09 MED ORDER — GLUCERNA SHAKE PO LIQD
237.0000 mL | Freq: Three times a day (TID) | ORAL | Status: DC
  Administered 2013-09-10 – 2013-09-13 (×5): 237 mL via ORAL
  Filled 2013-09-09: qty 237

## 2013-09-09 MED ORDER — GABAPENTIN 300 MG PO CAPS
300.0000 mg | ORAL_CAPSULE | Freq: Three times a day (TID) | ORAL | Status: DC | PRN
  Administered 2013-09-11: 300 mg via ORAL
  Filled 2013-09-09 (×2): qty 1

## 2013-09-09 MED ORDER — INSULIN GLARGINE 100 UNIT/ML ~~LOC~~ SOLN
20.0000 [IU] | Freq: Every day | SUBCUTANEOUS | Status: DC
  Administered 2013-09-09: 20 [IU] via SUBCUTANEOUS
  Filled 2013-09-09 (×3): qty 0.2

## 2013-09-09 MED ORDER — ALBUTEROL SULFATE (2.5 MG/3ML) 0.083% IN NEBU: 2.5000 mg | INHALATION_SOLUTION | RESPIRATORY_TRACT | Status: DC | PRN

## 2013-09-09 MED ORDER — ASPIRIN EC 81 MG PO TBEC
81.0000 mg | DELAYED_RELEASE_TABLET | Freq: Every day | ORAL | Status: DC
  Administered 2013-09-09 – 2013-09-13 (×5): 81 mg via ORAL
  Filled 2013-09-09 (×6): qty 1

## 2013-09-09 MED ORDER — ACETAMINOPHEN 325 MG PO TABS: 650.0000 mg | ORAL_TABLET | Freq: Four times a day (QID) | ORAL | Status: DC | PRN

## 2013-09-09 MED ORDER — CHLORHEXIDINE GLUCONATE 4 % EX LIQD
60.0000 mL | Freq: Once | CUTANEOUS | Status: DC
  Filled 2013-09-09: qty 60

## 2013-09-09 MED ORDER — CLINDAMYCIN PHOSPHATE 600 MG/50ML IV SOLN
600.0000 mg | Freq: Three times a day (TID) | INTRAVENOUS | Status: DC
  Administered 2013-09-09 – 2013-09-13 (×10): 600 mg via INTRAVENOUS
  Filled 2013-09-09 (×13): qty 50

## 2013-09-09 MED ORDER — PREDNISONE 10 MG PO TABS
10.0000 mg | ORAL_TABLET | Freq: Every day | ORAL | Status: DC
  Administered 2013-09-09 – 2013-09-13 (×5): 10 mg via ORAL
  Filled 2013-09-09 (×7): qty 1

## 2013-09-09 MED ORDER — VANCOMYCIN HCL IN DEXTROSE 1-5 GM/200ML-% IV SOLN
1000.0000 mg | Freq: Two times a day (BID) | INTRAVENOUS | Status: DC
  Administered 2013-09-09 – 2013-09-13 (×9): 1000 mg via INTRAVENOUS
  Filled 2013-09-09 (×11): qty 200

## 2013-09-09 MED ORDER — OXYCODONE HCL 5 MG PO TABS
5.0000 mg | ORAL_TABLET | ORAL | Status: DC | PRN
  Administered 2013-09-09: 5 mg via ORAL
  Filled 2013-09-09: qty 1

## 2013-09-09 MED ORDER — CLINDAMYCIN PHOSPHATE 600 MG/50ML IV SOLN
600.0000 mg | Freq: Three times a day (TID) | INTRAVENOUS | Status: DC
  Administered 2013-09-09 (×2): 600 mg via INTRAVENOUS
  Filled 2013-09-09 (×5): qty 50

## 2013-09-09 MED ORDER — GUAIFENESIN-DM 100-10 MG/5ML PO SYRP: 5.0000 mL | ORAL_SOLUTION | ORAL | Status: DC | PRN

## 2013-09-09 NOTE — Consult Note (Signed)
WOC wound consult note Reason for Consult:Left foot ulcers-chronic.  Right foot with osteo and necrosis, [facility] by Dr. Victorino DikeHewitt.  I will provided assessment and orders for nursing care of the left foot. Wound type:Chronic superficial lesions, traumatic vs infectious Pressure Ulcer POA: No Measurement:left lateral foot: 4cm x 5cm x 0.2cm pink with areas of crust (dried serum.  Dorsal crease of foot:  4cm x 4.5cm x 0.2cm.  LGT site of nail removal (traumatic):  2cm x 1.5cm with dried serum Wound bed:AS described above. Drainage (amount, consistency, odor) Scant serous Periwound:intact, dry Dressing procedure/placement/frequency:I have provided orders for twice daily saline dressings to the sites mentioned above.  Today I have dressed the right foot with dry gauze and it will not require dressing changes until OR tomorrow. WOC nursing team will not follow, but will remain available to this patient, the nursing and medical teams.  Please re-consult if needed. Thanks, Ladona MowLaurie Juniel Groene, MSN, RN, GNP, [place], CWON-AP [phone])

## 2013-09-09 NOTE — Progress Notes (Signed)
Report called to [facility].  Going to room 3.  Erick Blinksuchman, Jenese Mischke D, RN

## 2013-09-09 NOTE — Progress Notes (Signed)
Clinical Social Work  Therapist, sports reported that patient asked to speak with CSW again. CSW met with patient at room who reports he wants to return home at [place] because he does not want to lose his apartment. CSW suggested that patient wait until after surgery to make any final decisions and reminded him that PT/OT could evaluate and assist with recommendations as well. Patient reports understanding and agreeable for SNF search to continue.  [place], [place] [phone]
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
## 2013-09-09 NOTE — Progress Notes (Signed)
Pt belongings with pt.  Cell phone, wallet and bag.  Pt lost glasses down in ED and they were not able to locate them.  Erick Blinksuchman, Mellie Buccellato D, RN
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
## 2013-09-09 NOTE — Progress Notes (Signed)
PT Cancellation Note  Patient Details Name: [name] MRN: [id number] DOB: 02-15-1955   Cancelled Treatment:    Reason Eval/Treat Not Completed: Medical issues which prohibited therapy Per Dr. Laverta BaltimoreHewitt's note: "[facility] and plan on surgery tomorrow."  Please re-order after surgery.  Thanks!    LEMYRE,KATHrine E 09/09/2013, 8:27 AM [name], PT, DPT 09/09/2013 Pager: [phone]
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
## 2013-09-09 NOTE — Progress Notes (Addendum)
Clinical Social Work Department CLINICAL SOCIAL WORK PLACEMENT NOTE 09/09/2013  Patient:  Gregory DresserSTEWART,[name]  Account Number:  [id number] Admit date:  09/08/2013  Clinical Social Worker:  Unk LightningHOLLY GERBER, LCSW  Date/time:  09/09/2013 11:00 AM  Clinical Social Work is seeking post-discharge placement for this patient at the following level of care:   SKILLED NURSING   (*CSW will update this form in Epic as items are completed)   09/09/2013  [facility]/family provided with [facility] of Clinical Social Work's list of facilities offering this level of care within the geographic area requested by the patient (or if unable, by the patient's family).  09/09/2013  Patient/family informed of their freedom to choose among providers that offer the needed level of care, that participate in Medicare, Medicaid or managed care program needed by the patient, have an available bed and are willing to accept the patient.  09/09/2013  Patient/family informed of [facility]' ownership interest in [facility], as well as of the fact that they are under no obligation to receive care at this facility.  PASARR submitted to EDS on 09/09/2013 PASARR number received on 09/10/13  FL2 transmitted to all facilities in geographic area requested by pt/family on  09/09/2013 FL2 transmitted to all facilities within larger geographic area on   Patient informed that his/her managed care company has contracts with or will negotiate with  certain facilities, including the following:     Patient/family informed of bed offers received:  09/12/2013 [facility] Physician recommends and patient chooses bed at    [facility] on  09/13/2013 Patient to be transferred to facility by [facility] Patient and family notified of transfer on 09/13/2013 Name of family member notified:  Pt notified at bedside. Pt reports no family to report discharge  to.  The following physician request were entered in Epic:   Additional Comments:  Marcelline DeistEmily Brooke Steinhilber, MSW, [facility] Licensed Clinical Social Worker [phone] [id number] and [phone] [phone]

## 2013-09-09 NOTE — Progress Notes (Signed)
Clinical Social Work Department BRIEF PSYCHOSOCIAL ASSESSMENT 09/09/2013  Patient:  [name], [name]     Account Number:  [id number]     Admit date:  09/08/2013  Clinical Social Worker:  Earlie Server  Date/Time:  09/09/2013 11:15 AM  Referred by:  Physician  Date Referred:  09/09/2013 Referred for  SNF Placement   Other Referral:   Interview type:  Patient Other interview type:    PSYCHOSOCIAL DATA Living Status:  ALONE Admitted from facility:   Level of care:   Primary support name:  Lucita Ferrara Primary support relationship to patient:  PARENT Degree of support available:   Lacking    CURRENT CONCERNS Current Concerns  Post-Acute Placement   Other Concerns:    SOCIAL WORK ASSESSMENT / PLAN CSW received referral in order to assist with DC planning. CSW reviewed chart and met with patient at bedside. CSW introduced myself and explained role.    Patient reports that his mother was recently in the hospital and she was placed at [facility]. Patient reports mom has been in and out of SNF in the past but he feels that she will have to stay LT at this time. Patient upset about recent news of surgery but reports no support at home. Patient has siblings but they are not supportive. Patient has been to SNF in the past and reports he is probably going to have to return after surgery. CSW provided SNF list and explained Medicaid coverage. Patient aware and reports he would like to stay as close to mom as he could but agreeable to multiple county search.    CSW completed FL2 and faxed out to [place], [place], [place], [place], and [employer]. CSW submitted for PASRR and submitted clinicals with 30 day note. MD signed DMA form for [employer] authorization but CSW cannot submit until PASRR number is received. CSW will continue to follow.   Assessment/plan status:  Psychosocial Support/Ongoing Assessment of Needs Other assessment/ plan:   Information/referral to community resources:   SNF  information    PATIENT'S/FAMILY'S RESPONSE TO PLAN OF CARE: Patient alert and oriented. Patient reports he is upset and depressed about surgery. Patient has limited support and spoke with CSW about how he wished he had a better relationship with siblings. Patient feels that mom is receiving good care at current SNF but wants to be able to stay close to her. Patient thanked CSW for talking with him and agreeable to SNF.       [place], [place] [phone]
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
## 2013-09-09 NOTE — Progress Notes (Signed)
**Note Gregory-Identified via Obfuscation** OT Cancellation Note  Patient Details Name: [name] MRN: [id number] DOB: [birth year]   Cancelled Treatment:      Reason Eval/Treat Not Completed: Medical issues which prohibited therapy Per Dr. Laverta BaltimoreHewitt's note: "We'll transfer him to [facility] and plan on surgery tomorrow." Please re-order after surgery. Thanks!     [name], [name] 09/09/2013, 2:15 PM

## 2013-09-10 ENCOUNTER — Encounter (HOSPITAL_COMMUNITY): Payer: Self-pay | Admitting: Anesthesiology

## 2013-09-10 ENCOUNTER — Encounter (HOSPITAL_COMMUNITY)
Admission: EM | Disposition: A | Discharge status: Skilled Nursing Facility | Payer: Self-pay | Source: Home / Self Care | Attending: Internal Medicine

## 2013-09-10 ENCOUNTER — Inpatient Hospital Stay (HOSPITAL_COMMUNITY): Payer: Medicaid Other | Admitting: Anesthesiology

## 2013-09-10 ENCOUNTER — Encounter (HOSPITAL_COMMUNITY): Payer: Medicaid Other | Admitting: Anesthesiology

## 2013-09-10 DIAGNOSIS — L02419 Cutaneous abscess of limb, unspecified: Secondary | ICD-10-CM

## 2013-09-10 DIAGNOSIS — M869 Osteomyelitis, unspecified: Secondary | ICD-10-CM

## 2013-09-10 DIAGNOSIS — E1169 Type 2 diabetes mellitus with other specified complication: Secondary | ICD-10-CM | POA: Diagnosis present

## 2013-09-10 DIAGNOSIS — L03119 Cellulitis of unspecified part of limb: Secondary | ICD-10-CM

## 2013-09-10 HISTORY — PX: AMPUTATION: SHX166

## 2013-09-10 LAB — CBC
HEMATOCRIT: 29.4 % — AB (ref 39.0–52.0)
HEMOGLOBIN: 10.2 g/dL — AB (ref 13.0–17.0)
MCH: 26.8 pg (ref 26.0–34.0)
MCHC: 34.7 g/dL (ref 30.0–36.0)
MCV: 77.4 fL — ABNORMAL LOW (ref 78.0–100.0)
Platelets: 341 10*3/uL (ref 150–400)
RBC: 3.8 MIL/uL — ABNORMAL LOW (ref 4.22–5.81)
RDW: 14.4 % (ref 11.5–15.5)
WBC: 25.5 10*3/uL — ABNORMAL HIGH (ref 4.0–10.5)

## 2013-09-10 LAB — BASIC METABOLIC PANEL
Anion gap: 13 (ref 5–15)
BUN: 18 mg/dL (ref 6–23)
CHLORIDE: 98 meq/L (ref 96–112)
CO2: 21 mEq/L (ref 19–32)
Calcium: 7.4 mg/dL — ABNORMAL LOW (ref 8.4–10.5)
Creatinine, Ser: 0.75 mg/dL (ref 0.50–1.35)
GFR calc Af Amer: >90 mL/min (ref >90)
GLUCOSE: 296 mg/dL — AB (ref 70–99)
POTASSIUM: 4.5 meq/L (ref 3.7–5.3)
Sodium: 132 mEq/L — ABNORMAL LOW (ref 137–147)

## 2013-09-10 LAB — GLUCOSE, CAPILLARY
GLUCOSE-CAPILLARY: 311 mg/dL — AB (ref 70–99)
Glucose-Capillary: 191 mg/dL — ABNORMAL HIGH (ref 70–99)
Glucose-Capillary: 198 mg/dL — ABNORMAL HIGH (ref 70–99)
Glucose-Capillary: 277 mg/dL — ABNORMAL HIGH (ref 70–99)
Glucose-Capillary: 306 mg/dL — ABNORMAL HIGH (ref 70–99)
Glucose-Capillary: 189 mg/dL — ABNORMAL HIGH (ref 70–99)
Glucose-Capillary: 209 mg/dL — ABNORMAL HIGH (ref 70–99)
Glucose-Capillary: 194 mg/dL — ABNORMAL HIGH (ref 70–99)
Glucose-Capillary: 164 mg/dL — ABNORMAL HIGH (ref 70–99)

## 2013-09-10 SURGERY — AMPUTATION BELOW KNEE
Anesthesia: General | Laterality: Right

## 2013-09-10 MED ORDER — PROPOFOL 10 MG/ML IV BOLUS
INTRAVENOUS | Status: AC
Start: 2013-09-10 — End: 2013-09-10
  Filled 2013-09-10: qty 20

## 2013-09-10 MED ORDER — SODIUM CHLORIDE 0.9 % IV SOLN
INTRAVENOUS | Status: DC
  Administered 2013-09-10 – 2013-09-12 (×3): via INTRAVENOUS

## 2013-09-10 MED ORDER — PROPOFOL 10 MG/ML IV BOLUS
INTRAVENOUS | Status: DC | PRN
  Administered 2013-09-10: 50 mg via INTRAVENOUS
  Administered 2013-09-10: 150 mg via INTRAVENOUS

## 2013-09-10 MED ORDER — GLYCOPYRROLATE 0.2 MG/ML IJ SOLN
INTRAMUSCULAR | Status: DC | PRN
  Administered 2013-09-10: 0.2 mg via INTRAVENOUS

## 2013-09-10 MED ORDER — INSULIN GLARGINE 100 UNIT/ML ~~LOC~~ SOLN
25.0000 [IU] | Freq: Every day | SUBCUTANEOUS | Status: DC
  Administered 2013-09-10 – 2013-09-12 (×3): 25 [IU] via SUBCUTANEOUS
  Filled 2013-09-10 (×4): qty 0.25

## 2013-09-10 MED ORDER — ENOXAPARIN SODIUM 40 MG/0.4ML ~~LOC~~ SOLN
40.0000 mg | SUBCUTANEOUS | Status: DC
  Administered 2013-09-11 – 2013-09-13 (×3): 40 mg via SUBCUTANEOUS
  Filled 2013-09-10 (×5): qty 0.4

## 2013-09-10 MED ORDER — ARTIFICIAL TEARS OP OINT
TOPICAL_OINTMENT | OPHTHALMIC | Status: DC | PRN
  Administered 2013-09-10: 1 via OPHTHALMIC

## 2013-09-10 MED ORDER — SODIUM CHLORIDE 0.9 % IJ SOLN
INTRAMUSCULAR | Status: DC | PRN
  Administered 2013-09-10: 20 mL via INTRAVENOUS

## 2013-09-10 MED ORDER — METOCLOPRAMIDE HCL 5 MG PO TABS
5.0000 mg | ORAL_TABLET | Freq: Three times a day (TID) | ORAL | Status: DC | PRN
Start: 2013-09-10 — End: 2013-09-13

## 2013-09-10 MED ORDER — PHENYLEPHRINE HCL 10 MG/ML IJ SOLN
INTRAMUSCULAR | Status: DC | PRN
  Administered 2013-09-10 (×4): 80 ug via INTRAVENOUS
  Administered 2013-09-10 (×2): 40 ug via INTRAVENOUS

## 2013-09-10 MED ORDER — OXYCODONE HCL 5 MG PO TABS
5.0000 mg | ORAL_TABLET | ORAL | Status: DC | PRN
  Administered 2013-09-10 – 2013-09-11 (×2): 10 mg via ORAL
  Administered 2013-09-11: 5 mg via ORAL
  Administered 2013-09-12 – 2013-09-13 (×5): 10 mg via ORAL
  Filled 2013-09-10 (×8): qty 2
  Filled 2013-09-10: qty 1

## 2013-09-10 MED ORDER — LACTATED RINGERS IV SOLN
INTRAVENOUS | Status: DC | PRN
  Administered 2013-09-10 (×2): via INTRAVENOUS

## 2013-09-10 MED ORDER — BACITRACIN ZINC 500 UNIT/GM EX OINT
TOPICAL_OINTMENT | CUTANEOUS | Status: AC
  Filled 2013-09-10: qty 15

## 2013-09-10 MED ORDER — FENTANYL CITRATE 0.05 MG/ML IJ SOLN
INTRAMUSCULAR | Status: DC | PRN
  Administered 2013-09-10 (×2): 50 ug via INTRAVENOUS

## 2013-09-10 MED ORDER — LIDOCAINE HCL (CARDIAC) 20 MG/ML IV SOLN
INTRAVENOUS | Status: DC | PRN
  Administered 2013-09-10: 50 mg via INTRAVENOUS

## 2013-09-10 MED ORDER — LACTATED RINGERS IV SOLN
INTRAVENOUS | Status: DC
  Administered 2013-09-10: 15:00:00 via INTRAVENOUS

## 2013-09-10 MED ORDER — BUPIVACAINE LIPOSOME 1.3 % IJ SUSP
INTRAMUSCULAR | Status: DC | PRN
  Administered 2013-09-10: 20 mL

## 2013-09-10 MED ORDER — INSULIN ASPART 100 UNIT/ML ~~LOC~~ SOLN
3.0000 [IU] | Freq: Three times a day (TID) | SUBCUTANEOUS | Status: DC
  Administered 2013-09-10 – 2013-09-13 (×5): 3 [IU] via SUBCUTANEOUS

## 2013-09-10 MED ORDER — EPHEDRINE SULFATE 50 MG/ML IJ SOLN
INTRAMUSCULAR | Status: DC | PRN
  Administered 2013-09-10: 15 mg via INTRAVENOUS
  Administered 2013-09-10 (×2): 10 mg via INTRAVENOUS

## 2013-09-10 MED ORDER — SENNA 8.6 MG PO TABS
2.0000 | ORAL_TABLET | Freq: Two times a day (BID) | ORAL | Status: DC
  Administered 2013-09-10 – 2013-09-11 (×3): 17.2 mg via ORAL
  Filled 2013-09-10 (×10): qty 2

## 2013-09-10 MED ORDER — DOCUSATE SODIUM 100 MG PO CAPS
100.0000 mg | ORAL_CAPSULE | Freq: Two times a day (BID) | ORAL | Status: DC
  Administered 2013-09-10 – 2013-09-13 (×4): 100 mg via ORAL
  Filled 2013-09-10 (×5): qty 1

## 2013-09-10 MED ORDER — EPHEDRINE SULFATE 50 MG/ML IJ SOLN
INTRAMUSCULAR | Status: AC
Start: 2013-09-10 — End: 2013-09-10
  Filled 2013-09-10: qty 1

## 2013-09-10 MED ORDER — FENTANYL CITRATE 0.05 MG/ML IJ SOLN
INTRAMUSCULAR | Status: AC
  Filled 2013-09-10: qty 5

## 2013-09-10 MED ORDER — HYDROMORPHONE HCL PF 1 MG/ML IJ SOLN: 0.2500 mg | INTRAMUSCULAR | Status: DC | PRN

## 2013-09-10 MED ORDER — METOCLOPRAMIDE HCL 5 MG/ML IJ SOLN: 5.0000 mg | Freq: Three times a day (TID) | INTRAMUSCULAR | Status: DC | PRN

## 2013-09-10 MED ORDER — ONDANSETRON HCL 4 MG/2ML IJ SOLN: 4.0000 mg | Freq: Once | INTRAMUSCULAR | Status: DC | PRN

## 2013-09-10 MED ORDER — BUPIVACAINE LIPOSOME 1.3 % IJ SUSP
20.0000 mL | INTRAMUSCULAR | Status: DC
  Filled 2013-09-10: qty 20

## 2013-09-10 MED ORDER — HYDROMORPHONE HCL PF 1 MG/ML IJ SOLN
INTRAMUSCULAR | Status: AC
  Filled 2013-09-10: qty 1

## 2013-09-10 MED ORDER — SODIUM CHLORIDE 0.9 % IJ SOLN
INTRAMUSCULAR | Status: AC
Start: 2013-09-10 — End: 2013-09-10
  Filled 2013-09-10: qty 10

## 2013-09-10 SURGICAL SUPPLY — 57 items
BANDAGE ELASTIC 6 VELCRO ST LF (GAUZE/BANDAGES/DRESSINGS) ×2 IMPLANT
BANDAGE ESMARK 6X9 LF (GAUZE/BANDAGES/DRESSINGS) IMPLANT
BLADE SAW RECIP 87.9 MT (BLADE) ×3 IMPLANT
BNDG CMPR 9X6 STRL LF SNTH (GAUZE/BANDAGES/DRESSINGS)
BNDG COHESIVE 6X5 TAN STRL LF (GAUZE/BANDAGES/DRESSINGS) ×6 IMPLANT
BNDG ESMARK 6X9 LF (GAUZE/BANDAGES/DRESSINGS)
CANISTER SUCT 3000ML (MISCELLANEOUS) ×3 IMPLANT
CHLORAPREP W/TINT 26ML (MISCELLANEOUS) ×1 IMPLANT
COVER SURGICAL LIGHT HANDLE (MISCELLANEOUS) ×3 IMPLANT
CUFF TOURNIQUET SINGLE 34IN LL (TOURNIQUET CUFF) IMPLANT
CUFF TOURNIQUET SINGLE 44IN (TOURNIQUET CUFF) IMPLANT
DRAPE EXTREMITY T 121X128X90 (DRAPE) ×3 IMPLANT
DRAPE INCISE IOBAN 66X45 STRL (DRAPES) ×3 IMPLANT
DRAPE PROXIMA HALF (DRAPES) ×6 IMPLANT
DRAPE U-SHAPE 47X51 STRL (DRAPES) ×6 IMPLANT
DRSG MEPITEL 4X7.2 (GAUZE/BANDAGES/DRESSINGS) ×3 IMPLANT
DRSG PAD ABDOMINAL 8X10 ST (GAUZE/BANDAGES/DRESSINGS) ×7 IMPLANT
ELECT CAUTERY BLADE 6.4 (BLADE) IMPLANT
ELECT REM PT RETURN 9FT ADLT (ELECTROSURGICAL) ×3
ELECTRODE REM PT RTRN 9FT ADLT (ELECTROSURGICAL) ×1 IMPLANT
EVACUATOR 1/8 PVC DRAIN (DRAIN) IMPLANT
GLOVE BIO SURGEON STRL SZ7 (GLOVE) ×3 IMPLANT
GLOVE BIO SURGEON STRL SZ8 (GLOVE) ×3 IMPLANT
GLOVE BIOGEL PI IND STRL 7.5 (GLOVE) ×1 IMPLANT
GLOVE BIOGEL PI IND STRL 8 (GLOVE) ×1 IMPLANT
GLOVE BIOGEL PI INDICATOR 7.5 (GLOVE) ×2
GLOVE BIOGEL PI INDICATOR 8 (GLOVE) ×2
GOWN STRL REUS W/ TWL LRG LVL3 (GOWN DISPOSABLE) ×1 IMPLANT
GOWN STRL REUS W/ TWL XL LVL3 (GOWN DISPOSABLE) ×1 IMPLANT
GOWN STRL REUS W/TWL LRG LVL3 (GOWN DISPOSABLE) ×3
GOWN STRL REUS W/TWL XL LVL3 (GOWN DISPOSABLE) ×3
IMMOBILIZER KNEE 20 (SOFTGOODS) ×3
IMMOBILIZER KNEE 20 THIGH 36 (SOFTGOODS) IMPLANT
KIT BASIN OR (CUSTOM PROCEDURE TRAY) ×3 IMPLANT
KIT ROOM TURNOVER OR (KITS) ×3 IMPLANT
NS IRRIG 1000ML POUR BTL (IV SOLUTION) ×3 IMPLANT
PACK GENERAL/GYN (CUSTOM PROCEDURE TRAY) ×3 IMPLANT
PAD ARMBOARD 7.5X6 YLW CONV (MISCELLANEOUS) ×6 IMPLANT
PAD CAST 4YDX4 CTTN HI CHSV (CAST SUPPLIES) ×1 IMPLANT
PADDING CAST COTTON 4X4 STRL (CAST SUPPLIES)
PADDING CAST COTTON 6X4 STRL (CAST SUPPLIES) ×5 IMPLANT
SPONGE GAUZE 4X4 12PLY (GAUZE/BANDAGES/DRESSINGS) ×3 IMPLANT
SPONGE LAP 18X18 X RAY DECT (DISPOSABLE) IMPLANT
STAPLER VISISTAT 35W (STAPLE) IMPLANT
STOCKINETTE IMPERVIOUS LG (DRAPES) IMPLANT
SUT MNCRL AB 3-0 PS2 18 (SUTURE) ×3 IMPLANT
SUT PDS 2 0 (SUTURE) IMPLANT
SUT PDS AB 0 CT 36 (SUTURE) ×3 IMPLANT
SUT PROLENE 3 0 PS 2 (SUTURE) ×3 IMPLANT
SUT PROLENE 3 0 SH 48 (SUTURE) IMPLANT
SUT SILK 2 0 (SUTURE) ×3
SUT SILK 2-0 18XBRD TIE 12 (SUTURE) ×1 IMPLANT
SWAB COLLECTION DEVICE MRSA (MISCELLANEOUS) IMPLANT
TOWEL OR 17X24 6PK STRL BLUE (TOWEL DISPOSABLE) ×3 IMPLANT
TOWEL OR 17X26 10 PK STRL BLUE (TOWEL DISPOSABLE) ×3 IMPLANT
TUBE ANAEROBIC SPECIMEN COL (MISCELLANEOUS) IMPLANT
WATER STERILE IRR 1000ML POUR (IV SOLUTION) ×3 IMPLANT

## 2013-09-10 NOTE — Anesthesia Procedure Notes (Signed)
Procedure Name: LMA Insertion Date/Time: 09/10/2013 3:44 PM Performed by: Fransisca KaufmannMEYER, Masaichi Kracht E Pre-anesthesia Checklist: Patient identified, Emergency Drugs available, Suction available, Timeout performed and Patient being monitored Patient Re-evaluated:Patient Re-evaluated prior to inductionOxygen Delivery Method: Circle system utilized Preoxygenation: Pre-oxygenation with 100% oxygen Intubation Type: IV induction Ventilation: Mask ventilation without difficulty LMA: LMA inserted LMA Size: 4.0 Number of attempts: 1 Placement Confirmation: positive ETCO2 and breath sounds checked- equal and bilateral Tube secured with: Tape Dental Injury: Teeth and Oropharynx as per pre-operative assessment  Comments: Poor dentition/intact as pre-op

## 2013-09-10 NOTE — Transfer of Care (Signed)
Immediate Anesthesia Transfer of Care Note  Patient: [name]  Procedure(s) Performed: Procedure(s): Right Below Knee Amputation (Right)  Patient Location: PACU  Anesthesia Type:General  Level of Consciousness: awake, alert , oriented and sedated  Airway & Oxygen Therapy: Patient Spontanous Breathing and Patient connected to face mask oxygen  Post-op Assessment: Report given to PACU RN, Post -op Vital signs reviewed and stable and Patient moving all extremities  Post vital signs: Reviewed and stable  Complications: No apparent anesthesia complications

## 2013-09-10 NOTE — Progress Notes (Signed)
Patient ID: [name]  male  ZOX:[id number]    DOB: 09-19-1955    DOA: 09/08/2013  PCP: Ron Parker, MD  Brief history of present illness 58 y.o. male with a Past Medical History of diabetes mellitus , diabetic neuropathy, Addison's disease on chronic prednisone, PTSD, schizophrenia, anxiety, history of traumatic brain injury who presented to the ED with Worsening bilateral leg wounds ongoing for the past 2 months but worsenined for last 10 days.  He also c/o Vomiting, diarrhea and fatigue for the past 5-6 days. Patient noted to have diabetic foot ulcer with osteomyelitis of right toes. Seen by orthopedics and recommend transfer to [facility] for OR on 7/28   Assessment/Plan: Principal Problem:  SIRS DUE to diabetic foot ulcer and osteomyelitis - Blood cultures 7/27 still negative  - Continue IV vancomycin, Zosyn and clindamycin -  Orthopedics consulted, recommended transmetatarsal amputation vs BKA, today - Continue pain control, Neurontin, oxycodone, IV fluids  Active Problems:   Diabetes mellitus with neuropathy - Blood sugars uncontrolled - Increased Lantus to 25 units, placed on meal coverage NovoLog 3 units TID, SSI    Hyponatremia - Likely scheduled to dehydration, improving    Addison's disease -Continue prednisone, Florinef     PTSD (post-traumatic stress disorder) - Continue Prozac, Klonopin   DVT Prophylaxis:  Code Status:  Family Communication:  Disposition:For OR today  Consultants:  Orthopedics  Procedures:  None  Antibiotics:  IV vancomycin  IV Zosyn  IV clindamycin  Subjective: Patient seen and examined, awaiting OR, getting anxious about his housing situation with the landlord   Objective: Weight change:   Intake/Output Summary (Last 24 hours) at 09/10/13 1105 Last data filed at 09/10/13 0830  Gross per 24 hour  Intake      0 ml  Output    875 ml  Net   -875 ml   Blood pressure 154/70, pulse 57, temperature 97.7  F (36.5 C), temperature source Oral, resp. rate 18, height 5\' 10"  (1.778 m), weight 101.334 kg (223 lb 6.4 oz), SpO2 95.00%.  Physical Exam: General: Alert and awake, oriented x3, not in any acute distress. CVS: S1-S2 clear, no murmur rubs or gallops Chest: clear to auscultation bilaterally, no wheezing, rales or rhonchi Abdomen: soft nontender, nondistended, normal bowel sounds  Extremities: Both feet dressing intact Neuro: Cranial nerves II-XII intact, no focal neurological deficits   Lab Results: Basic Metabolic Panel:  Recent Labs Lab 09/09/13 0109 09/10/13 0340  NA 127* 132*  K 3.3* 4.5  CL 89* 98  CO2 18* 21  GLUCOSE 202* 296*  BUN 11 18  CREATININE 0.79 0.75  CALCIUM 7.8* 7.4*   Liver Function Tests:  Recent Labs Lab 09/06/13 0545 09/08/13 2208  AST 31 43*  ALT 11 31  ALKPHOS 68 77  BILITOT 1.0 0.8  PROT 6.9 6.2  ALBUMIN 3.0* 2.5*    Recent Labs Lab 09/08/13 2208  LIPASE 11   No results found for this basename: AMMONIA,  in the last 168 hours CBC:  Recent Labs Lab 09/08/13 2208 09/09/13 0109 09/10/13 0340  WBC 16.4* 16.4* 25.5*  NEUTROABS 15.0*  --   --   HGB 9.4* 9.6* 10.2*  HCT 26.7* 27.3* 29.4*  MCV 77.2* 76.9* 77.4*  PLT 282 283 341   Cardiac Enzymes:  Recent Labs Lab 09/06/13 0545 09/08/13 2208  TROPONINI <0.30 <0.30   BNP: No components found with this basename: POCBNP,  CBG:  Recent Labs Lab 09/09/13 1150 09/09/13 1858 09/10/13 0742  GLUCAP 306* 308* 311*     Micro Results: Recent Results (from the past 240 hour(s))  CULTURE, BLOOD (ROUTINE X 2)     Status: None   Collection Time    09/09/13  1:01 AM      Result Value Ref Range Status   Specimen Description BLOOD LEFT ARM   Final   Special Requests BOTTLES DRAWN AEROBIC AND ANAEROBIC 8CC   Final   Culture  Setup Time     Final   Value: 09/09/2013 09:30     Performed at [facility]   Culture     Final   Value:        BLOOD CULTURE RECEIVED NO GROWTH  TO DATE CULTURE WILL BE HELD FOR 5 DAYS BEFORE ISSUING A FINAL NEGATIVE REPORT     Performed at [facility]   Report Status PENDING   Incomplete  CULTURE, BLOOD (ROUTINE X 2)     Status: None   Collection Time    09/09/13  1:10 AM      Result Value Ref Range Status   Specimen Description BLOOD RIGHT HAND   Final   Special Requests BOTTLES DRAWN AEROBIC AND ANAEROBIC 10CC   Final   Culture  Setup Time     Final   Value: 09/09/2013 09:29     Performed at [facility]   Culture     Final   Value:        BLOOD CULTURE RECEIVED NO GROWTH TO DATE CULTURE WILL BE HELD FOR 5 DAYS BEFORE ISSUING A FINAL NEGATIVE REPORT     Performed at [facility]   Report Status PENDING   Incomplete  MRSA PCR SCREENING     Status: None   Collection Time    09/09/13  1:54 AM      Result Value Ref Range Status   MRSA by PCR NEGATIVE  NEGATIVE Final   Comment:            The GeneXpert MRSA Assay (FDA     approved for NASAL specimens     only), is one component of a     comprehensive MRSA colonization     surveillance program. It is not     intended to diagnose MRSA     infection nor to guide or     monitor treatment for     MRSA infections.    Studies/Results: Dg Chest 2 View  09/08/2013   CLINICAL DATA:  Foot infection.  EXAM: CHEST  2 VIEW  COMPARISON:  Chest radiograph September 06, 2013  FINDINGS: The cardiac silhouette appears mildly enlarged, mediastinal silhouette is nonsuspicious. Central pulmonary vascular congestion. Low inspiratory examination. No pleural effusions or focal consolidations. No pneumothorax.  Calcification inferior to the left glenohumeral joint space may reflect loose body. Soft tissue planes are nonsuspicious. Multiple EKG lines overlie the patient and may obscure subtle underlying pathology.  IMPRESSION: Mild cardiomegaly and central pulmonary vascular congestion.   Electronically Signed   By: Awilda Metro   On: 09/08/2013 23:25   Dg Chest 2  View  09/06/2013   CLINICAL DATA:  Shortness of breath, cough, congestion.  EXAM: CHEST  2 VIEW  COMPARISON:  09/03/2013  FINDINGS: The heart size and mediastinal contours are within normal limits. Both lungs are clear. The visualized skeletal structures are unremarkable.  IMPRESSION: No active cardiopulmonary disease.   Electronically Signed   By: Charlett Nose M.D.   On: 09/06/2013 07:13  Dg Chest 2 View  09/03/2013   CLINICAL DATA:  Chest pain, fatigue and shortness of breath.  EXAM: CHEST  2 VIEW  COMPARISON:  Chest radiograph performed 10/14/2011  FINDINGS: The lungs are well-aerated. Vascular congestion is noted. Peribronchial thickening is seen. There is no evidence of focal opacification, pleural effusion or pneumothorax. Apparent biapical pleural thickening may reflect positioning.  The heart is borderline normal in size; the mediastinal contour is within normal limits. No acute osseous abnormalities are seen.  IMPRESSION: Vascular congestion, without significant pulmonary edema. Peribronchial thickening noted.   Electronically Signed   By: Roanna Raider M.D.   On: 09/03/2013 02:40   Ct Head Wo Contrast  09/08/2013   CLINICAL DATA:  Altered mental status  EXAM: CT HEAD WITHOUT CONTRAST  TECHNIQUE: Contiguous axial images were obtained from the base of the skull through the vertex without intravenous contrast.  COMPARISON:  03/15/2011  FINDINGS: Mild stable atrophy. No infarct mass hydrocephalus hemorrhage or extra-axial fluid. Calvarium intact.  IMPRESSION: Negative   Electronically Signed   By: Esperanza Heir M.D.   On: 09/08/2013 23:16   Ct Abdomen Pelvis W Contrast  09/06/2013   CLINICAL DATA:  58 year old male with abdominal pain nausea vomiting diarrhea weakness. Initial encounter. Diabetes.  EXAM: CT ABDOMEN AND PELVIS WITH CONTRAST  TECHNIQUE: Multidetector CT imaging of the abdomen and pelvis was performed using the standard protocol following bolus administration of intravenous  contrast.  CONTRAST:  50mL OMNIPAQUE IOHEXOL 300 MG/ML SOLN, OMNIPAQUE IOHEXOL 300 MG/ML SOLN  COMPARISON:  Abdomen radiographs 09/23/2011.  FINDINGS: Trace or small bilateral layering pleural effusions. No pericardial effusion. Minor dependent scarring or atelectasis at the lung bases.  Facet degeneration in the lumbar spine. No acute osseous abnormality identified.  No pelvic free fluid. Negative rectum. Redundant sigmoid colon tracking to the right lower quadrant, but otherwise negative. Retained stool in the left colon with no wall thickening. Retained stool in the transverse colon. Mildly redundant flexures. Normal right colon and appendix. Negative terminal ileum. Oral contrast has not yet reached the TI. No dilated or inflamed small bowel identified. Decompressed stomach and duodenum.  Negative liver, gallbladder, spleen, pancreas (intermittently fatty infiltrated), and adrenal glands. No abdominal free fluid. Suboptimal intravascular contrast timing. Aortoiliac calcified atherosclerosis noted. Major arterial structures in the abdomen and pelvis appear to remain patent. Negative kidneys with normal symmetric contrast excretion on delayed images. Mild bilateral perinephric stranding could be related to a age or a degree of intrinsic chronic renal disease (e.g. due to diabetes). No lymphadenopathy.  IMPRESSION: No acute or inflammatory findings in the abdomen or pelvis. Small/trace layering pleural effusions.   Electronically Signed   By: Augusto Gamble M.D.   On: 09/06/2013 09:02   Dg Foot Complete Left  09/08/2013   CLINICAL DATA:  Foot infection.  EXAM: LEFT FOOT - COMPLETE 3+ VIEW  COMPARISON:  Left foot radiographs January 10, 2011  FINDINGS: 9 mm linear radiopaque foreign body within the medial plantar aspect of the a second distal phalanx soft tissues. No acute fracture deformity. No dislocation. Status post proximal aspect of the fifth metatarsus fracture with new bony second/ third metatarsal  fusion. First metatarsal sesamoid osteoarthrosis. The Sclerosis and nondisplaced fracture of second metatarsal diaphysis. Small plantar calcaneal spur. Mid and forefoot dorsal soft tissue swelling without subcutaneous gas. Moderate vascular calcifications.  IMPRESSION: Subcentimeter linear radiopaque foreign bodies within second digit distal soft tissues may reflect a needle fragment.  Subacute stress fracture of second metatarsus. No  acute fracture deformity or dislocation.  Status post base of fifth metatarsus amputation. No definite radiographic findings of osteomyelitis though, MRI would be more sensitive.   Electronically Signed   By: Awilda Metroourtnay  Bloomer   On: 09/08/2013 23:23   Dg Foot Complete Right  09/08/2013   CLINICAL DATA:  Foot infection.  EXAM: RIGHT FOOT COMPLETE - 3+ VIEW  COMPARISON:  None.  FINDINGS: First digit and proximal first metatarsal subcutaneous gas. Moderate vascular calcifications. Erosive changes of the medial aspect of the first proximal and distal phalanx medially. Possible subcutaneous gas within the fifth digit. Limited evaluation due to overlying bandage.  Remote displaced distal fifth metatarsus fracture with foreshortening. Chronic nondisplaced base of fourth metatarsus fracture. Severe fourth metatarsophalangeal osteoarthrosis. Moderate plantar calcaneal spur.  IMPRESSION: First digit subcutaneous gas with underlying erosions likely highly concerning for osteomyelitis, considering the location superimposed septic joint may be present. Possible subcutaneous gas fifth digit soft tissues.  Remote base of fourth and distal fifth metatarsal fractures without acute fracture deformity or dislocation.   Electronically Signed   By: Awilda Metroourtnay  Bloomer   On: 09/08/2013 23:28    Medications: Scheduled Meds: . aspirin EC  81 mg Oral Daily  . chlorhexidine  60 mL Topical Once  . clindamycin (CLEOCIN) IV  600 mg Intravenous 3 times per day  . clonazePAM  0.5 mg Oral TID  . feeding  supplement (GLUCERNA SHAKE)  237 mL Oral TID BM  . fludrocortisone  0.2 mg Oral Daily  . FLUoxetine  60 mg Oral Daily  . insulin aspart  0-9 Units Subcutaneous TID WC  . insulin glargine  20 Units Subcutaneous QHS  . pantoprazole  40 mg Oral Daily  . piperacillin-tazobactam (ZOSYN)  IV  3.375 g Intravenous 3 times per day  . predniSONE  10 mg Oral Q breakfast  . pregabalin  75 mg Oral TID  . vancomycin  1,000 mg Intravenous Q12H      LOS: 2 days   Calan Doren M.D. [facility] 09/10/2013, 11:05 AM Pager: [phone]  If 7PM-7AM, please contact night-coverage [email or web address] Password [email or web address]  **Disclaimer: This note was dictated with voice recognition software. Similar sounding words can inadvertently be transcribed and this note may contain transcription errors which may not have been corrected upon publication of note.**

## 2013-09-10 NOTE — Brief Op Note (Signed)
09/08/2013 - 09/10/2013  5:03 PM  PATIENT:  [name]  58 y.o. male  PRE-OPERATIVE DIAGNOSIS:  Right foot wet gangrene and osteomyelitis  POST-OPERATIVE DIAGNOSIS:  same  Procedure(s):  Right Below Knee Amputation  SURGEON:  Toni ArthursJohn Chantele Corado, MD  ASSISTANT:  Lucretia KernJacquie Allen, PA-C  ANESTHESIA:   General  EBL:  minimal   TOURNIQUET:   Total Tourniquet Time Documented: Thigh (Right) - 20 minutes Total: Thigh (Right) - 20 minutes   COMPLICATIONS:  None apparent  DISPOSITION:  Extubated, awake and stable to recovery.  DICTATION ID:  [id number]

## 2013-09-10 NOTE — Progress Notes (Signed)
PT Cancellation Note  Patient Details Name: Gregory DresserDouglas E Zaino MRN: [id number] DOB: 1955-09-23   Cancelled Treatment:    Reason Eval/Treat Not Completed: Patient at procedure or test/unavailable.  In surgery this p.m. 09/10/2013  Ipava BingKen Kristoffer Bala, PT [phone]  (pager)   Twila Rappa, Eliseo GumKenneth V 09/10/2013, 4:44 PM

## 2013-09-10 NOTE — Anesthesia Preprocedure Evaluation (Signed)
Anesthesia Evaluation  Patient identified by MRN, date of birth, ID band Patient awake    Reviewed: Allergy & Precautions, H&P , NPO status , Patient's Chart, lab work & pertinent test results  Airway       Dental   Pulmonary sleep apnea ,          Cardiovascular + Peripheral Vascular Disease     Neuro/Psych  Headaches, Anxiety Depression Schizophrenia    GI/Hepatic GERD-  ,  Endo/Other  diabetes, Type 2  Renal/GU      Musculoskeletal   Abdominal   Peds  Hematology   Anesthesia Other Findings   Reproductive/Obstetrics                           Anesthesia Physical Anesthesia Plan  ASA: III  Anesthesia Plan: General   Post-op Pain Management:    Induction: Intravenous  Airway Management Planned: LMA and Oral ETT  Additional Equipment:   Intra-op Plan:   Post-operative Plan: Extubation in OR  Informed Consent: I have reviewed the patients History and Physical, chart, labs and discussed the procedure including the risks, benefits and alternatives for the proposed anesthesia with the patient or authorized representative who has indicated his/her understanding and acceptance.     Plan Discussed with: CRNA, Anesthesiologist and Surgeon  Anesthesia Plan Comments:         Anesthesia Quick Evaluation

## 2013-09-11 ENCOUNTER — Encounter (HOSPITAL_COMMUNITY): Payer: Self-pay | Admitting: Orthopedic Surgery

## 2013-09-11 LAB — GLUCOSE, CAPILLARY
GLUCOSE-CAPILLARY: 144 mg/dL — AB (ref 70–99)
GLUCOSE-CAPILLARY: 100 mg/dL — AB (ref 70–99)
Glucose-Capillary: 132 mg/dL — ABNORMAL HIGH (ref 70–99)
Glucose-Capillary: 84 mg/dL (ref 70–99)
Glucose-Capillary: 177 mg/dL — ABNORMAL HIGH (ref 70–99)

## 2013-09-11 MED ORDER — LIVING WELL WITH DIABETES BOOK
Freq: Once | Status: AC
  Administered 2013-09-11: 11:00:00
  Filled 2013-09-11: qty 1

## 2013-09-11 MED ORDER — INSULIN STARTER KIT- SYRINGES (ENGLISH)
1.0000 | Freq: Once | Status: AC
  Administered 2013-09-11: 1
  Filled 2013-09-11: qty 1

## 2013-09-11 NOTE — Progress Notes (Signed)
Subjective: 1 Day Post-Op Procedure(s) (LRB): Right Below Knee Amputation (Right) Patient reports pain as moderate. He states that his leg did not start hurting until this morning, and has not had to use pain medications at this point. He has had some mild nausea since waking up from anesthesia and describes some mild anorexia. He has been drinking fluids.  He is not wearing his knee immobilizer. He denies any new concerns or issues. He denies any new HA, SOB, CP, vomiting, fever or chills. He is on c.diff contact precautions.   Objective: Vital signs in last 24 hours: Temp:  [97.6 F (36.4 C)-98.4 F (36.9 C)] 98.4 F (36.9 C) (07/29 0518) Pulse Rate:  [50-75] 60 (07/29 0518) Resp:  [11-18] 18 (07/29 0518) BP: (111-168)/(52-74) 111/69 mmHg (07/29 0518) SpO2:  [92 %-100 %] 95 % (07/29 0518)  Intake/Output from previous day: 07/28 0701 - 07/29 0700 In: 3017 [I.V.:3017] Out: 1600 [Urine:1500; Blood:100] Intake/Output this shift: Total I/O In: 240 [P.O.:240] Out: -    Recent Labs  09/08/13 2208 09/09/13 0109 09/10/13 0340  HGB 9.4* 9.6* 10.2*    Recent Labs  09/09/13 0109 09/10/13 0340  WBC 16.4* 25.5*  RBC 3.55* 3.80*  HCT 27.3* 29.4*  PLT 283 341    Recent Labs  09/09/13 0109 09/10/13 0340  NA 127* 132*  K 3.3* 4.5  CL 89* 98  CO2 18* 21  BUN 11 18  CREATININE 0.79 0.75  GLUCOSE 202* 296*  CALCIUM 7.8* 7.4*   No results found for this basename: LABPT, INR,  in the last 72 hours  Patient is a WD/WN male in NAD. He is A and O x4. Mood and affect are approptiate. EOMI. Respirations normal and unlabored. Dressings are C/D/I. No obvious deformity, ecchymoses, or swelling noted. 3/5 strength of R quadriceps. No lympahdenopathy. Foley catheter in place with adequate urine output.   Assessment/Plan: 1 Day Post-Op Procedure(s) (LRB): Right Below Knee Amputation (Right) Lovenox for DVT prophylaxis. WBAT on LLE. NWB on RLE.   Toni ArthursHEWITT, Malike Foglio 09/11/2013, 10:31  AM

## 2013-09-11 NOTE — Progress Notes (Signed)
Pt. Removed knee immobilizer, advised and educated but continued to refuse.

## 2013-09-11 NOTE — Op Note (Signed)
NAME:  [name], [name] NO.:  [id number]  MEDICAL RECORD NO.:  [id number]  LOCATION:  [id number]                        FACILITY:  [facility]  PHYSICIAN:  Toni Arthurs, MD        DATE OF BIRTH:  1955-06-03  DATE OF PROCEDURE:  09/10/2013 DATE OF DISCHARGE:                              OPERATIVE REPORT   PREOPERATIVE DIAGNOSIS:  Right foot wet gangrene and osteomyelitis.  POSTOPERATIVE DIAGNOSIS:  Right foot wet gangrene and osteomyelitis.  PROCEDURE:  Right below-knee amputation.  SURGEON:  Toni Arthurs, MD.  ASSISTANT:  Lucretia Kern, PA-C.  ANESTHESIA:  General.  ESTIMATED BLOOD LOSS:  Minimal.  TOURNIQUET TIME:  20 minutes at 250 mmHg.  COMPLICATIONS:  None apparent.  DISPOSITION:  Extubated, awake, and stable to recovery.  INDICATIONS FOR PROCEDURE:  The patient is a 58 year old male with past medical history significant for diabetes and schizophrenia.  He presented to the hospital and was admitted 2 days ago for gangrene of his right foot.  Radiographs revealed osteomyelitis of the forefoot as well.  He presents now for right below-knee versus transmetatarsal amputation.  He understands the risks and benefits, the alternative treatment options, and elects surgical treatment.  He specifically understands risks of bleeding, infection, nerve damage, blood clots, need for additional surgery, revision amputation, and death.  PROCEDURE IN DETAIL:  After preoperative consent was obtained and the correct operative site was identified, the patient was brought to the operating room and placed supine on the operating table.  General anesthesia was induced.  Preoperative antibiotics were administered. Surgical time-out was taken.  The right lower extremity was prepped and draped in standard sterile fashion with a tourniquet around the thigh. The extremity was then carefully inspected.  The medial forefoot ulcer involved the entirety of the hallux and extended to the  level of the midfoot plantarly.  There was copious purulence in this area.  The plantar medial soft tissue was largely compromised for the entire foot. Laterally, the 5th and 4th toes were involved with gangrene.  This extended to the level of the mid metatarsal shaft and again compromised the plantar lateral soft tissue almost in its entirety.  Based on the severe involvement of the foot to the level of the midfoot, decision was made to proceed with below-knee amputation as a transmetatarsal amputation would very likely be unsuccessful and would require future revision to a below-knee amputation.  Right lower extremity was then elevated and the tourniquet was inflated to 250 mmHg.  A posterior flap incision was marked on the skin 15 cm distal from the medial joint line.  The incision was made.  Sharp dissection was carried down through the skin and subcutaneous tissue to the level of the tibia.  Periosteum was elevated.  The soft tissues were protected and a reciprocating saw was used to cut through the tibia. The fibula was dissected free and was also cut approximately 2 cm from the tibial cut, taking care to protect the soft tissues.  A bone hook was reduced to retract to the distal portion of the leg anteriorly as an amputation knife was used to cut through the posterior soft tissues bevelling the posterior flap appropriately.  The leg was then passed off the field as a specimen to Pathology.  The arteries were all doubly clipped with medium vascular clips.  All named nerves were placed on gentle traction and were transected with a sharp 15 blade.  These were allowed to retract in the soft tissues appropriately.  The tourniquet was released.  Hemostasis was achieved.  The gastrocnemius tendon was repaired to the anterior periosteum with imbricating sutures of 0-PDS. The deep fascia was approximated with simple sutures of 0-PDS. Subcutaneous tissue was approximated with inverted  simple sutures of 3-0 Monocryl.  Horizontal mattress sutures of 2-0 nylon were used to close the skin incision.  20 mL of Exparel mixed with 20 mL of normal saline were infiltrated into the subcutaneous tissues around the incision. Sterile dressings were applied followed by a compression wrap and a short knee immobilizer.  The patient was awakened by Anesthesia and transported to the recovery room in stable condition.  Wound had been irrigated copiously prior to closure and the tibial cut was beveled with a rasp as well.  FOLLOWUP PLAN:  The patient will be evaluated by physical therapy, occupational therapy, and social work.  He will likely need at least short-term rehab.  He will continue on antibiotics for 24 hours and will resume DVT prophylaxis tomorrow.  Lucretia KernJacquie Allen, PA-C was present and scrubbed for the duration of the case.  Her assistance was essential in gaining and maintaining exposure, performing the operation, closing and dressing the wounds, and applying the immobilizer.     Toni ArthursJohn Lizzet Hendley, MD     JH/MEDQ  D:  09/10/2013  T:  09/11/2013  Job:  [id number]

## 2013-09-11 NOTE — Anesthesia Postprocedure Evaluation (Signed)
  Anesthesia Post-op Note  Patient: Gregory DresserDouglas E Comella  Procedure(s) Performed: Procedure(s): Right Below Knee Amputation (Right)  Patient Location: PACU  Anesthesia Type:General  Level of Consciousness: awake, oriented, sedated and patient cooperative  Airway and Oxygen Therapy: Patient Spontanous Breathing  Post-op Pain: mild  Post-op Assessment: Post-op Vital signs reviewed, Patient's Cardiovascular Status Stable, Respiratory Function Stable, Patent Airway, No signs of Nausea or vomiting and Pain level controlled  Post-op Vital Signs: stable  Last Vitals:  Filed Vitals:   09/11/13 0518  BP: 111/69  Pulse: 60  Temp: 36.9 C  Resp: 18    Complications: No apparent anesthesia complications

## 2013-09-11 NOTE — Evaluation (Signed)
Occupational Therapy Evaluation Patient Details Name: Junious DresserDouglas E Reeg MRN: [id number] DOB: Jul 11, 1955 Today's Date: 09/11/2013    History of Present Illness 58 y.o. with a history of diabetes, diabetic neuropathy, Addison's disease on chronic prednisone, PTSD, schizophrenia, anxiety, history of traumatic brain injury. Pt s/p Right BKA and found to have ulcers with some drainage in the lateral aspect of his left foot.   Clinical Impression   Pt s/p above. Pt presents with below problem list. Recommending SNF for additional rehab. Pt states he has no where to go after d/c from hospital and no one to assist him.    Follow Up Recommendations  SNF;Supervision/Assistance - 24 hour    Equipment Recommendations  Other (comment) (defer to next venue)    Recommendations for Other Services       Precautions / Restrictions Precautions Precaution Comments: no pillow under right knee Restrictions Weight Bearing Restrictions: Yes RLE Weight Bearing: Non weight bearing      Mobility Bed Mobility Overal bed mobility: Needs Assistance Bed Mobility: Supine to Sit;Rolling Rolling: Supervision   Supine to sit: Min guard     General bed mobility comments: Cues for technique for supine to sit.   Transfers Overall transfer level: Needs assistance Equipment used: Rolling walker (2 wheeled) Transfers: Sit to/from Stand Sit to Stand: +2 physical assistance;Max assist         General transfer comment: cues for technique. Pt's left ankle appears to be rolling out.     Balance                                            ADL Overall ADL's : Needs assistance/impaired         Upper Body Bathing: Set up;Bed level   Lower Body Bathing: Minimal assistance;Bed level   Upper Body Dressing : Set up;Bed level   Lower Body Dressing: Moderate assistance;Sitting/lateral leans   Toilet Transfer: +2 for physical assistance;Maximal assistance;RW (sit to stand from elevated  bed)             General ADL Comments: Pt sat EOB and became pale and dizzy. Had to return to supine. Pt soaked with sweat, so OT/tech helped change sheets and pt performed bathing with rolling technique. OT explained that pt could lean side to side or roll for ADLs versus standing. Educated on desensitization techniques for RLE and reviewed precautions.  Told pt elevating LE's will help with edema. Pt also sat EOB and able to don part of left sock (dressing on left foot).     Vision                     Perception     Praxis      Pertinent Vitals/Pain Pain indicated but not rated. Elevated LE's.     Hand Dominance     Extremity/Trunk Assessment Upper Extremity Assessment Upper Extremity Assessment: Overall WFL for tasks assessed   Lower Extremity Assessment Lower Extremity Assessment: Defer to PT evaluation       Communication Communication Communication: No difficulties   Cognition Arousal/Alertness: Awake/alert Behavior During Therapy: WFL for tasks assessed/performed Overall Cognitive Status: History of cognitive impairments - at baseline                     General Comments       Exercises  Shoulder Instructions      Home Living Family/patient expects to be discharged to:: Skilled nursing facility Living Arrangements: Alone                               Additional Comments: Pt states he has no one to help him at discharge as his mother is in a nursing facility and does not have a home.      Prior Functioning/Environment          Comments: unsure of baseline-pt reports he managed before    OT Diagnosis: Acute pain   OT Problem List: Decreased strength;Decreased activity tolerance;Impaired balance (sitting and/or standing);Decreased knowledge of use of DME or AE;Decreased knowledge of precautions;Pain;Decreased cognition;Decreased range of motion   OT Treatment/Interventions: Self-care/ADL training;DME and/or AE  instruction;Therapeutic activities;Patient/family education;Balance training    OT Goals(Current goals can be found in the care plan section) Acute Rehab OT Goals Patient Stated Goal: not stated OT Goal Formulation: With patient Time For Goal Achievement: 09/25/13 Potential to Achieve Goals: Good ADL Goals Pt Will Perform Lower Body Dressing: with set-up;sitting/lateral leans;sit to/from stand;with supervision Pt Will Transfer to Toilet: with min assist;stand pivot transfer;squat pivot transfer;bedside commode Pt Will Perform Toileting - Clothing Manipulation and hygiene: sitting/lateral leans;sit to/from stand;with supervision Additional ADL Goal #1: Pt will independently perform desensitization techniques on RLE.  OT Frequency: Min 2X/week   Barriers to D/C: Decreased caregiver support          Co-evaluation              End of Session Equipment Utilized During Treatment: Gait belt;Rolling walker Nurse Communication: Mobility status  Activity Tolerance: Other (comment) (dizziness) Patient left: in bed;with call bell/phone within reach;with bed alarm set   Time: 4098-1191 OT Time Calculation (min): 35 min Charges:  OT General Charges $OT Visit: 1 Procedure OT Evaluation $Initial OT Evaluation Tier I: 1 Procedure OT Treatments $Self Care/Home Management : 8-22 mins G-CodesEarlie Raveling OTR/L [phone] 09/11/2013, 5:06 PM

## 2013-09-11 NOTE — Progress Notes (Signed)
Patient ID: [name]  male  ZOX:[id number]    DOB: 1956/01/12    DOA: 09/08/2013  PCP: Ron Parker, MD  Brief history of present illness 58 y.o. male with a Past Medical History of diabetes mellitus , diabetic neuropathy, Addison's disease on chronic prednisone, PTSD, schizophrenia, anxiety, history of traumatic brain injury who presented to the ED with Worsening bilateral leg wounds ongoing for the past 2 months but worsenined for last 10 days.  He also c/o Vomiting, diarrhea and fatigue for the past 5-6 days. Patient noted to have diabetic foot ulcer with osteomyelitis of right toes. Seen by orthopedics and recommend transfer to [place] for OR on 7/28   Assessment/Plan: Principal Problem:  Sepsis due to diabetic foot ulcer and osteomyelitis - Blood cultures 7/27 still negative  - Continue IV vancomycin, Zosyn and clindamycin - Orthopedics following, s/p R BKA  - Continue pain control, Neurontin, oxycodone, IV fluids -L foot: with Subcentimeter linear radiopaque foreign bodies within second digit distal soft tissues may reflect a needle fragment, will ask Dr.Hewitt to comment    Diabetes mellitus with neuropathy - Blood sugars uncontrolled - Increased Lantus to 25 units, continue meal coverage NovoLog 3 units TID, SSI    Hyponatremia - Likely scheduled to dehydration, improving    Addison's disease -Continue prednisone, Florinef     PTSD (post-traumatic stress disorder) - Continue Prozac, Klonopin  DVT Prophylaxis: lovenox  Code Status:Full Code  Family Communication: none at bedside  Disposition:may need SNF Consultants:  Orthopedics  Procedures:  None  Antibiotics:  IV vancomycin  IV Zosyn  IV clindamycin  Subjective:   Objective: Weight change:   Intake/Output Summary (Last 24 hours) at 09/11/13 1736 Last data filed at 09/11/13 1424  Gross per 24 hour  Intake   3007 ml  Output   1250 ml  Net   1757 ml   Blood pressure 121/64,  pulse 60, temperature 97.9 F (36.6 C), temperature source Oral, resp. rate 18, height 5\' 10"  (1.778 m), weight 101.334 kg (223 lb 6.4 oz), SpO2 95.00%.  Physical Exam: General: Alert and awake, oriented x3, not in any acute distress. CVS: S1-S2 clear, no murmur rubs or gallops Chest: clear to auscultation bilaterally, no wheezing, rales or rhonchi Abdomen: soft nontender, nondistended, normal bowel sounds  Extremities: R BKA and dressing on L foot Neuro: Cranial nerves II-XII intact, no focal neurological deficits   Lab Results: Basic Metabolic Panel:  Recent Labs Lab 09/09/13 0109 09/10/13 0340  NA 127* 132*  K 3.3* 4.5  CL 89* 98  CO2 18* 21  GLUCOSE 202* 296*  BUN 11 18  CREATININE 0.79 0.75  CALCIUM 7.8* 7.4*   Liver Function Tests:  Recent Labs Lab 09/06/13 0545 09/08/13 2208  AST 31 43*  ALT 11 31  ALKPHOS 68 77  BILITOT 1.0 0.8  PROT 6.9 6.2  ALBUMIN 3.0* 2.5*    Recent Labs Lab 09/08/13 2208  LIPASE 11   No results found for this basename: AMMONIA,  in the last 168 hours CBC:  Recent Labs Lab 09/08/13 2208 09/09/13 0109 09/10/13 0340  WBC 16.4* 16.4* 25.5*  NEUTROABS 15.0*  --   --   HGB 9.4* 9.6* 10.2*  HCT 26.7* 27.3* 29.4*  MCV 77.2* 76.9* 77.4*  PLT 282 283 341   Cardiac Enzymes:  Recent Labs Lab 09/06/13 0545 09/08/13 2208  TROPONINI <0.30 <0.30   BNP: No components found with this basename: POCBNP,  CBG:  Recent Labs Lab 09/10/13  1801 09/10/13 2227 09/11/13 0744 09/11/13 1118 09/11/13 1717  GLUCAP 194* 164* 132* 144* 84     Micro Results: Recent Results (from the past 240 hour(s))  CULTURE, BLOOD (ROUTINE X 2)     Status: None   Collection Time    09/09/13  1:01 AM      Result Value Ref Range Status   Specimen Description BLOOD LEFT ARM   Final   Special Requests BOTTLES DRAWN AEROBIC AND ANAEROBIC 8CC   Final   Culture  Setup Time     Final   Value: 09/09/2013 09:30     Performed at [facility]    Culture     Final   Value:        BLOOD CULTURE RECEIVED NO GROWTH TO DATE CULTURE WILL BE HELD FOR 5 DAYS BEFORE ISSUING A FINAL NEGATIVE REPORT     Performed at [facility]   Report Status PENDING   Incomplete  CULTURE, BLOOD (ROUTINE X 2)     Status: None   Collection Time    09/09/13  1:10 AM      Result Value Ref Range Status   Specimen Description BLOOD RIGHT HAND   Final   Special Requests BOTTLES DRAWN AEROBIC AND ANAEROBIC 10CC   Final   Culture  Setup Time     Final   Value: 09/09/2013 09:29     Performed at [facility]   Culture     Final   Value:        BLOOD CULTURE RECEIVED NO GROWTH TO DATE CULTURE WILL BE HELD FOR 5 DAYS BEFORE ISSUING A FINAL NEGATIVE REPORT     Performed at [facility]   Report Status PENDING   Incomplete  MRSA PCR SCREENING     Status: None   Collection Time    09/09/13  1:54 AM      Result Value Ref Range Status   MRSA by PCR NEGATIVE  NEGATIVE Final   Comment:            The GeneXpert MRSA Assay (FDA     approved for NASAL specimens     only), is one component of a     comprehensive MRSA colonization     surveillance program. It is not     intended to diagnose MRSA     infection nor to guide or     monitor treatment for     MRSA infections.    Studies/Results: Dg Chest 2 View  09/08/2013   CLINICAL DATA:  Foot infection.  EXAM: CHEST  2 VIEW  COMPARISON:  Chest radiograph September 06, 2013  FINDINGS: The cardiac silhouette appears mildly enlarged, mediastinal silhouette is nonsuspicious. Central pulmonary vascular congestion. Low inspiratory examination. No pleural effusions or focal consolidations. No pneumothorax.  Calcification inferior to the left glenohumeral joint space may reflect loose body. Soft tissue planes are nonsuspicious. Multiple EKG lines overlie the patient and may obscure subtle underlying pathology.  IMPRESSION: Mild cardiomegaly and central pulmonary vascular congestion.   Electronically Signed   By:  Awilda Metro   On: 09/08/2013 23:25   Dg Chest 2 View  09/06/2013   CLINICAL DATA:  Shortness of breath, cough, congestion.  EXAM: CHEST  2 VIEW  COMPARISON:  09/03/2013  FINDINGS: The heart size and mediastinal contours are within normal limits. Both lungs are clear. The visualized skeletal structures are unremarkable.  IMPRESSION: No active cardiopulmonary disease.   Electronically Signed  By: Kevin  Dover M.D.   On: 09/06/2013 07:13   Dg Chest 2 View  09/03/2013   CLINICAL DATA:  Chest pain, fatigue and shortness of breath.  EXAM: CHEST  2 VIEW  COMPARISON:  Chest radiograph performed 10/14/2011  FINDINGS: The lungs are well-aerated. Vascular congestion is noted. Peribronchial thickening is seen. There is no evidence of focal opacification, pleural effusion or pneumothorax. Apparent biapical pleural thickening may reflect positioning.  The heart is borderline normal in size; the mediastinal contour is within normal limits. No acute osseous abnormalities are seen.  IMPRESSION: Vascular congestion, without significant pulmonary edema. Peribronchial thickening noted.   Electronically Signed   By: Jeffery  Chang M.D.   On: 09/03/2013 02:40   Ct Head Wo Contrast  09/08/2013   CLINICAL DATA:  Altered mental status  EXAM: CT HEAD WITHOUT CONTRAST  TECHNIQUE: Contiguous axial images were obtained from the base of the skull through the vertex without intravenous contrast.  COMPARISON:  03/15/2011  FINDINGS: Mild stable atrophy. No infarct mass hydrocephalus hemorrhage or extra-axial fluid. Calvarium intact.  IMPPhs IndianTempe St [facility] RToy BakerDowseBakerDowsectronUva ugPriCasimiro Jo91Hendricks Regional HealtEncompass Health Rehabilitation Hospital Of LittletonhRToy BakerDowsesaWyline MooCIllino ILPriCasimiro Jo91Mid-[facility] State HospitalVRToy BakerDowsest ColumbRogers Mem<MEASUSt Lukes Hospital Sacred Heart CampusRLittle Rock Diagnostic Clinic AscRRToy BakerDowseg EndoscopSt. Luke'<MEASUREMENJamWest Covina Medical CenteraRToy BakerDowsedso auP EAPriCasimiro Jo91[facility].4JaEssex County Hospital CentermRToy BakerDowseegional Medical Cen rWPriCasimiro Jo91[facility] [place].4JamaBaylor Medical Center At Trophy ClubWyline MooCIllinoisIKorean WaPriCasimiro Jo91[facility].4JamaSurgery Center Of Chevy ChaseWyline MooCIllinoisIKoreandiWater engineerginAlaska Spine Cen3ter<MEASUREM >adiographs 09/23/2011.  FINDINGS: Trace or small bilateral layering pleural effusions. No pericardial effusion. Minor dependent scarring or atelectasis at the lung bases.  Facet degeneration in the lumbar spine. No acute osseous abnormality identified.  No pelvic free fluid. Negative rectum. Redundant sigmoid colon tracking to the right lower quadrant, but otherwise negative. Retained stool in the left colon with no wall thickening. Retained stool in the transverse colon. Mildly redundant flexures. Normal right colon and appendix. Negative terminal ileum. Oral contrast has not yet reached the TI. No dilated or inflamed small bowel identified. Decompressed stomach and duodenum.  Negative liver, gallbladder, spleen, pancreas (intermittently fatty infiltrated), and adrenal glands. No abdominal free fluid. Suboptimal intravascular contrast timing. Aortoiliac calcified atherosclerosis noted. Major arterial structures in the abdomen and pelvis appear to remain patent. Negative kidneys with normal symmetric contrast excretion on delayed images. Mild bilateral perinephric stranding could be related to a age or a degree of intrinsic chronic renal disease (e.g. due to diabetes). No lymphadenopathy.  IMPRESSION: No acute or inflammatory findings in the abdomen or pelvis. Small/trace layering pleural effusions.   Electronically Signed   By: Lee  Hall M.D.   On: 09/06/2013 09:02   Dg Foot Complete Left  09/08/2013   CLINICAL DATA:  Foot infection.  EXAM: LEFT FOOT - COMPLETE 3+ VIEW  COMPARISON:  Left foot radiographs January 10, 2011  FINDINGS: 9 mm linear radiopaque foreign body within the medial plantar aspect of the a second distal phalanx soft tissues. No acute fracture deformity. No dislocation. Status post proximal aspect of the fifth  metatarsus fracture with new bony second/ third metatarsal fusion. First metatarsal sesamoid osteoarthrosis. The Sclerosis and nondisplaced fracture of second metatarsal diaphysis. Small plantar calcaneal spur. Mid and forefoot dorsal soft tissue swelling without subcutaneous gas. Moderate vascular calcifications.  IMPRESSION: Subcentimeter linear radiopaque foreign bodies within second digit distal soft tissues may  reflect a needle fragment.  Subacute stress fracture of second metatarsus. No acute fracture deformity or dislocation.  Status post base of fifth metatarsus amputation. No definite radiographic findings of osteomyelitis though, MRI would be more sensitive.   Electronically Signed   By: Awilda Metroourtnay  Bloomer   On: 09/08/2013 23:23   Dg Foot Complete Right  09/08/2013   CLINICAL DATA:  Foot infection.  EXAM: RIGHT FOOT COMPLETE - 3+ VIEW  COMPARISON:  None.  FINDINGS: First digit and proximal first metatarsal subcutaneous gas. Moderate vascular calcifications. Erosive changes of the medial aspect of the first proximal and distal phalanx medially. Possible subcutaneous gas within the fifth digit. Limited evaluation due to overlying bandage.  Remote displaced distal fifth metatarsus fracture with foreshortening. Chronic nondisplaced base of fourth metatarsus fracture. Severe fourth metatarsophalangeal osteoarthrosis. Moderate plantar calcaneal spur.  IMPRESSION: First digit subcutaneous gas with underlying erosions likely highly concerning for osteomyelitis, considering the location superimposed septic joint may be present. Possible subcutaneous gas fifth digit soft tissues.  Remote base of fourth and distal fifth metatarsal fractures without acute fracture deformity or dislocation.   Electronically Signed   By: Awilda Metroourtnay  Bloomer   On: 09/08/2013 23:28    Medications: Scheduled Meds: . aspirin EC  81 mg Oral Daily  . clindamycin (CLEOCIN) IV  600 mg Intravenous 3 times per day  . clonazePAM  0.5 mg  Oral TID  . docusate sodium  100 mg Oral BID  . enoxaparin (LOVENOX) injection  40 mg Subcutaneous Q24H  . feeding supplement (GLUCERNA SHAKE)  237 mL Oral TID BM  . fludrocortisone  0.2 mg Oral Daily  . FLUoxetine  60 mg Oral Daily  . insulin aspart  0-9 Units Subcutaneous TID WC  . insulin aspart  3 Units Subcutaneous TID WC  . insulin glargine  25 Units Subcutaneous QHS  . pantoprazole  40 mg Oral Daily  . piperacillin-tazobactam (ZOSYN)  IV  3.375 g Intravenous 3 times per day  . predniSONE  10 mg Oral Q breakfast  . pregabalin  75 mg Oral TID  . senna  2 tablet Oral BID  . vancomycin  1,000 mg Intravenous Q12H      LOS: 3 days   Niajah Sipos M.D. [facility] 09/11/2013, 5:36 PM Pager: [phone]  If 7PM-7AM, please contact night-coverage [email or web address] Password [email or web address]  **Disclaimer: This note was dictated with voice recognition software. Similar sounding words can inadvertently be transcribed and this note may contain transcription errors which may not have been corrected upon publication of note.**

## 2013-09-11 NOTE — Progress Notes (Signed)
PT Cancellation Note  Patient Details Name: [name] MRN: [id number] DOB: [birth year]   Cancelled Treatment:    Reason Eval/Treat Not Completed: Patient declined, no reason specified.  Pt worked with OT earlier, is in pain, and is eating his dinner.  He would like to defer PT assessment until tomorrow.   Thanks,    Rollene Rotundaebecca B. Raymond Bhardwaj, PT, DPT [phone]   09/11/2013, 5:47 PM

## 2013-09-12 LAB — GLUCOSE, CAPILLARY
GLUCOSE-CAPILLARY: 69 mg/dL — AB (ref 70–99)
Glucose-Capillary: 84 mg/dL (ref 70–99)
Glucose-Capillary: 92 mg/dL (ref 70–99)
Glucose-Capillary: 116 mg/dL — ABNORMAL HIGH (ref 70–99)

## 2013-09-12 LAB — CBC
HCT: 26.6 % — ABNORMAL LOW (ref 39.0–52.0)
Hemoglobin: 8.9 g/dL — ABNORMAL LOW (ref 13.0–17.0)
MCH: 26.5 pg (ref 26.0–34.0)
MCHC: 33.5 g/dL (ref 30.0–36.0)
MCV: 79.2 fL (ref 78.0–100.0)
Platelets: 413 10*3/uL — ABNORMAL HIGH (ref 150–400)
RBC: 3.36 MIL/uL — ABNORMAL LOW (ref 4.22–5.81)
RDW: 15.1 % (ref 11.5–15.5)
WBC: 9.3 10*3/uL (ref 4.0–10.5)

## 2013-09-12 LAB — BASIC METABOLIC PANEL
Anion gap: 11 (ref 5–15)
BUN: 9 mg/dL (ref 6–23)
CALCIUM: 7.3 mg/dL — AB (ref 8.4–10.5)
CHLORIDE: 103 meq/L (ref 96–112)
CO2: 25 mEq/L (ref 19–32)
Creatinine, Ser: 0.88 mg/dL (ref 0.50–1.35)
GFR calc Af Amer: >90 mL/min (ref >90)
GFR calc non Af Amer: >90 mL/min (ref >90)
Glucose, Bld: 78 mg/dL (ref 70–99)
Potassium: 3.5 mEq/L — ABNORMAL LOW (ref 3.7–5.3)
SODIUM: 139 meq/L (ref 137–147)

## 2013-09-12 LAB — CLOSTRIDIUM DIFFICILE BY PCR: Toxigenic C. Difficile by PCR: NEGATIVE

## 2013-09-12 NOTE — Progress Notes (Addendum)
Subjective: 2 Days Post-Op Procedure(s) (LRB): Right Below Knee Amputation (Right) Patient reports pain as mild.   Worked with PT today.  Objective: Vital signs in last 24 hours: Temp:  [97.8 F (36.6 C)-98 F (36.7 C)] 97.8 F (36.6 C) (07/30 1453) Pulse Rate:  [53-62] 54 (07/30 1453) Resp:  [16-18] 16 (07/30 1453) BP: (122-158)/(64-78) 158/78 mmHg (07/30 1453) SpO2:  [95 %-97 %] 95 % (07/30 1453)  Intake/Output from previous day: 07/29 0701 - 07/30 0700 In: 1827 [P.O.:960; I.V.:867] Out: 1900 [Urine:1900] Intake/Output this shift: Total I/O In: 970 [P.O.:840; I.V.:30; IV Piggyback:100] Out: 650 [Urine:650]   Recent Labs  09/10/13 0340 09/12/13 0530  HGB 10.2* 8.9*    Recent Labs  09/10/13 0340 09/12/13 0530  WBC 25.5* 9.3  RBC 3.80* 3.36*  HCT 29.4* 26.6*  PLT 341 413*    Recent Labs  09/10/13 0340 09/12/13 0530  NA 132* 139  K 4.5 3.5*  CL 98 103  CO2 21 25  BUN 18 9  CREATININE 0.75 0.88  GLUCOSE 296* 78  CALCIUM 7.4* 7.3*   No results found for this basename: LABPT, INR,  in the last 72 hours  PE:  R LE dressed and dry.  Knee immob off.  5/5 strength at quad.  Assessment/Plan: 2 Days Post-Op Procedure(s) (LRB): Right Below Knee Amputation (Right) Up with therapy  Stump shrinker and protector tomorrow.   Surgery was done through a healthy area with no sign of infection.  No indication for abx at this point for surgical wound.    [name], [name] 09/12/2013, 5:12 PM
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
## 2013-09-13 ENCOUNTER — Encounter (HOSPITAL_COMMUNITY): Payer: Self-pay | Admitting: Emergency Medicine

## 2013-09-13 ENCOUNTER — Emergency Department (HOSPITAL_COMMUNITY)
Admission: EM | Admit: 2013-09-13 | Discharge: 2013-09-14 | Disposition: A | Discharge status: Home / Self Care | Payer: Medicaid Other | Attending: Emergency Medicine | Admitting: Emergency Medicine

## 2013-09-13 DIAGNOSIS — H40009 Preglaucoma, unspecified, unspecified eye: Secondary | ICD-10-CM | POA: Insufficient documentation

## 2013-09-13 DIAGNOSIS — Z79899 Other long term (current) drug therapy: Secondary | ICD-10-CM | POA: Insufficient documentation

## 2013-09-13 DIAGNOSIS — M79604 Pain in right leg: Secondary | ICD-10-CM

## 2013-09-13 DIAGNOSIS — E1142 Type 2 diabetes mellitus with diabetic polyneuropathy: Secondary | ICD-10-CM

## 2013-09-13 DIAGNOSIS — G4733 Obstructive sleep apnea (adult) (pediatric): Secondary | ICD-10-CM | POA: Insufficient documentation

## 2013-09-13 DIAGNOSIS — Z9981 Dependence on supplemental oxygen: Secondary | ICD-10-CM | POA: Insufficient documentation

## 2013-09-13 DIAGNOSIS — S88919A Complete traumatic amputation of unspecified lower leg, level unspecified, initial encounter: Secondary | ICD-10-CM | POA: Insufficient documentation

## 2013-09-13 DIAGNOSIS — IMO0002 Reserved for concepts with insufficient information to code with codable children: Secondary | ICD-10-CM | POA: Insufficient documentation

## 2013-09-13 DIAGNOSIS — F411 Generalized anxiety disorder: Secondary | ICD-10-CM | POA: Insufficient documentation

## 2013-09-13 DIAGNOSIS — F3289 Other specified depressive episodes: Secondary | ICD-10-CM | POA: Insufficient documentation

## 2013-09-13 DIAGNOSIS — M79609 Pain in unspecified limb: Secondary | ICD-10-CM | POA: Insufficient documentation

## 2013-09-13 DIAGNOSIS — R197 Diarrhea, unspecified: Secondary | ICD-10-CM | POA: Insufficient documentation

## 2013-09-13 DIAGNOSIS — F329 Major depressive disorder, single episode, unspecified: Secondary | ICD-10-CM | POA: Insufficient documentation

## 2013-09-13 DIAGNOSIS — Z794 Long term (current) use of insulin: Secondary | ICD-10-CM | POA: Insufficient documentation

## 2013-09-13 DIAGNOSIS — Z87820 Personal history of traumatic brain injury: Secondary | ICD-10-CM | POA: Insufficient documentation

## 2013-09-13 DIAGNOSIS — R110 Nausea: Secondary | ICD-10-CM | POA: Insufficient documentation

## 2013-09-13 DIAGNOSIS — E119 Type 2 diabetes mellitus without complications: Secondary | ICD-10-CM | POA: Insufficient documentation

## 2013-09-13 DIAGNOSIS — Z89431 Acquired absence of right foot: Secondary | ICD-10-CM

## 2013-09-13 DIAGNOSIS — R509 Fever, unspecified: Secondary | ICD-10-CM | POA: Insufficient documentation

## 2013-09-13 DIAGNOSIS — R0602 Shortness of breath: Secondary | ICD-10-CM | POA: Insufficient documentation

## 2013-09-13 DIAGNOSIS — R109 Unspecified abdominal pain: Secondary | ICD-10-CM | POA: Insufficient documentation

## 2013-09-13 DIAGNOSIS — E1149 Type 2 diabetes mellitus with other diabetic neurological complication: Secondary | ICD-10-CM

## 2013-09-13 DIAGNOSIS — K219 Gastro-esophageal reflux disease without esophagitis: Secondary | ICD-10-CM | POA: Insufficient documentation

## 2013-09-13 DIAGNOSIS — Z872 Personal history of diseases of the skin and subcutaneous tissue: Secondary | ICD-10-CM | POA: Insufficient documentation

## 2013-09-13 LAB — GLUCOSE, CAPILLARY
GLUCOSE-CAPILLARY: 142 mg/dL — AB (ref 70–99)
Glucose-Capillary: 98 mg/dL (ref 70–99)

## 2013-09-13 MED ORDER — INSULIN GLARGINE 100 UNIT/ML ~~LOC~~ SOLN: 20.0000 [IU] | Freq: Every day | SUBCUTANEOUS | Status: DC

## 2013-09-13 MED ORDER — CLONAZEPAM 1 MG PO TABS: 1.0000 mg | ORAL_TABLET | Freq: Two times a day (BID) | ORAL | Status: DC | PRN

## 2013-09-13 MED ORDER — OXYCODONE HCL 5 MG PO TABS: 5.0000 mg | ORAL_TABLET | Freq: Four times a day (QID) | ORAL | Status: DC | PRN

## 2013-09-13 MED ORDER — CLONAZEPAM 0.5 MG PO TABS
0.5000 mg | ORAL_TABLET | Freq: Three times a day (TID) | ORAL | Status: DC
Start: 2013-09-13 — End: 2013-10-15

## 2013-09-13 MED ORDER — INSULIN GLARGINE 100 UNIT/ML ~~LOC~~ SOLN
20.0000 [IU] | Freq: Every day | SUBCUTANEOUS | Status: DC
  Filled 2013-09-13: qty 0.2

## 2013-09-13 MED ORDER — SENNA 8.6 MG PO TABS: 2.0000 | ORAL_TABLET | Freq: Two times a day (BID) | ORAL | Status: DC
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
## 2013-09-13 NOTE — Progress Notes (Signed)
Patient discharged to [facility] transported by EMS staff. Alert and oriented , not in any distress prior to discharge.

## 2013-09-13 NOTE — ED Notes (Signed)
Marissa, PA-C, at the bedside.

## 2013-09-13 NOTE — Discharge Summary (Signed)
Physician Discharge Summary  [name] ZOX:[id number] DOB: 01-05-1956 DOA: 09/08/2013  PCP: Ron ParkerJENKINS,HARVETTE C, MD  Admit date: 09/08/2013 Discharge date: 09/13/2013  Time spent: 45 minutes  Recommendations for Outpatient Follow-up:  1. Dr.Hewitt in 1-2weeks 2. Please monitor L foot, suspected foreign body in second digit distal soft tissues     3. Activity: Non weight Bearing RLE   Wound Care for R BKA: daily dressing. Leave silicone dressing in place over sutures, Dry 4X4s over silicone.Roll stump shrinker sock over the 4X4s. Stump protector on over the sock  Wound Care for L foot: cleanse foot with NS, pat gently dry, apply moistened gauze dressing to lateral and dorsal foot lesions.Place saline dampened gauze dressing over Left great toe, cover dampened gauze with dry gauze and secure with Kerlix, change daily.  Discharge Diagnoses:  Principal Problem:   Diabetic osteomyelitis and gangrene Active Problems:   Diabetes mellitus with neuropathy   Diabetic foot ulcers   Hyponatremia   Addison's disease   PTSD (post-traumatic stress disorder)   H/o schizophrenia,    Anxiety,    history of traumatic brain injury    Discharge Condition: stable  Activity: Non weight Bearing RLE  Diet recommendation: regular  Filed Weights   09/09/13 0047  Weight: 101.334 kg (223 lb 6.4 oz)    History of present illness:  Gregory DresserDouglas E Allers is a 58 y.o. male with a Past Medical History of diabetes, diabetic neuropathy, Addison's disease on chronic prednisone, PTSD, schizophrenia, anxiety, history of traumatic brain injury who presented to the ER with multiple complaints. He claimed that for the past week or so he had persistent vomiting, diarrhea and has not been able to take his usual medications.   He presented to the emergency room with weakness and fatigue , upon further evaluation he was found to have foul-smelling ulceration mostly coming from his right foot, further evaluation  revealed macerated right first toe and right fifth toe, with active purulent drainage. X-rays of the right foot revealed possible underlying osteomyelitis of the first great toe. He was also found to have ulcers with some drainage in the lateral aspect of his left foot as well.   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, wound healing well - Blood cultures 7/27 still negative  - Abx stopped post op day 2 - Continue pain control, Neurontin, oxycodone -Wound care as noted above  -L foot: with Subcentimeter linear radiopaque foreign bodies within second digit distal soft tissues which may reflect a needle fragment, d/w dr.Hewitt, no evidence of infection at this time, he will monitor this  -no recommendations for surgical removal per Dr.Hewitt now  DM - Blood sugars controlled  - Continue Lantus to 20units, SSI   Hyponatremia  - Likely scheduled to dehydration, improved  Addison's disease  -Continue prednisone, Florinef   PTSD (post-traumatic stress disorder)  - Continue Prozac, Klonopin   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 If you have any questions about your discharge medications or the care you received while you were in the hospital after you are  discharged, you can call the unit and asked to speak with the hospitalist on call if the hospitalist that took care of you is not available. Once you are discharged, your primary care physician will handle any further medical issues. Please note that NO REFILLS for any discharge medications will be  authorized once you are discharged, as it is imperative that you return to your primary care physician (or establish a relationship with a primary care physician if you do not have one) for your aftercare needs so that they can reassess your need for medications and monitor your lab values.  Discharge Instructions   Care order/instruction    Complete by:  As directed   Dry 4x4s over the silicone dressing daily.  Stump shrinker sock to hold dressings in place.  Stump protector on at all times except PT.     Diet general    Complete by:  As directed      Non weight bearing    Complete by:  As directed   Laterality:  right  Extremity:  Lower            Medication List    STOP taking these medications       neomycin-bacitracin-polymyxin ointment  Commonly known as:  NEOSPORIN      TAKE these medications       aspirin EC 81 MG tablet  Take 81 mg by mouth daily.     aspirin-acetaminophen-caffeine 250-250-65 MG per tablet  Commonly known as:  EXCEDRIN MIGRAINE  Take 1 tablet by mouth every 6 (six) hours as needed. Migraine     clonazePAM 0.5 MG tablet  Commonly known as:  KLONOPIN  Take 1 tablet (0.5 mg total) by mouth 3 (three) times daily.     clonazePAM 1 MG tablet  Commonly known as:  KLONOPIN  Take 1 tablet (1 mg total) by mouth 2 (two) times daily as needed. Anxiety     diphenhydrAMINE 25 mg capsule  Commonly known as:  BENADRYL  Take 25 mg by mouth at bedtime as needed. For sleep     fludrocortisone 0.1 MG tablet  Commonly known as:  FLORINEF  Take 0.2 mg by mouth daily.     FLUoxetine 20 MG capsule  Commonly known as:  PROZAC  Take 60 mg by mouth daily.     fluticasone 50 MCG/ACT nasal spray  Commonly known as:  FLONASE  Place 1 spray into the nose daily as needed. For allergies     gabapentin 300 MG capsule  Commonly known as:  NEURONTIN  Take 300 mg by mouth 3 (three) times daily as needed (neuropathy).     insulin glargine 100 UNIT/ML injection   Commonly known as:  LANTUS  Inject 0.2 mLs (20 Units total) into the skin at bedtime.     ondansetron 4 MG tablet  Commonly known as:  ZOFRAN  Take 8 mg by mouth every 6 (six) hours as needed. For nausea     oxyCODONE 5 MG immediate release tablet  Commonly known as:  Oxy IR/ROXICODONE  Take 1-2 tablets (5-10 mg total) by mouth every 6 (six) hours as needed for moderate pain or severe pain.     pantoprazole 40 MG tablet  Commonly known as:  PROTONIX  Take 40 mg by mouth daily.     predniSONE 10 MG tablet  Commonly known as:  DELTASONE  Take 10 mg by mouth daily with breakfast.     pregabalin 75 MG capsule  Commonly known  as:  LYRICA  Take 75 mg by mouth 3 (three) times daily.     senna 8.6 MG Tabs tablet  Commonly known as:  SENOKOT  Take 2 tablets (17.2 mg total) by mouth 2 (two) times daily.       Allergies  Allergen Reactions  . Aspirin Nausea And Vomiting    High doses only       Follow-up Information   Follow up with HEWITT, Jonny Ruiz, MD. Schedule an appointment as soon as possible for a visit in 2 weeks.   Specialty:  Orthopedic Surgery   Contact information:   [address] [name] [address] [phone]        The results of significant diagnostics from this hospitalization (including imaging, microbiology, ancillary and laboratory) are listed below for reference.    Significant Diagnostic Studies: Dg Chest 2 View  09/08/2013   CLINICAL DATA:  Foot infection.  EXAM: CHEST  2 VIEW  COMPARISON:  Chest radiograph September 06, 2013  FINDINGS: The cardiac silhouette appears mildly enlarged, mediastinal silhouette is nonsuspicious. Central pulmonary vascular congestion. Low inspiratory examination. No pleural effusions or focal consolidations. No pneumothorax.  Calcification inferior to the left glenohumeral joint space may reflect loose body. Soft tissue planes are nonsuspicious. Multiple EKG lines overlie the patient and may obscure subtle  underlying pathology.  IMPRESSION: Mild cardiomegaly and central pulmonary vascular congestion.   Electronically Signed   By: Awilda Metro   On: 09/08/2013 23:25   Dg Chest 2 View  09/06/2013   CLINICAL DATA:  Shortness of breath, cough, congestion.  EXAM: CHEST  2 VIEW  COMPARISON:  09/03/2013  FINDINGS: The heart size and mediastinal contours are within normal limits. Both lungs are clear. The visualized skeletal structures are unremarkable.  IMPRESSION: No active cardiopulmonary disease.   Electronically Signed   By: Charlett Nose M.D.   On: 09/06/2013 07:13   Dg Chest 2 View  09/03/2013   CLINICAL DATA:  Chest pain, fatigue and shortness of breath.  EXAM: CHEST  2 VIEW  COMPARISON:  Chest radiograph performed 10/14/2011  FINDINGS: The lungs are well-aerated. Vascular congestion is noted. Peribronchial thickening is seen. There is no evidence of focal opacification, pleural effusion or pneumothorax. Apparent biapical pleural thickening may reflect positioning.  The heart is borderline normal in size; the mediastinal contour is within normal limits. No acute osseous abnormalities are seen.  IMPRESSION: Vascular congestion, without significant pulmonary edema. Peribronchial thickening noted.   Electronically Signed   By: Roanna Raider M.D.   On: 09/03/2013 02:40   Ct Head Wo Contrast  09/08/2013   CLINICAL DATA:  Altered mental status  EXAM: CT HEAD WITHOUT CONTRAST  TECHNIQUE: Contiguous axial images were obtained from the base of the skull through the vertex without intravenous contrast.  COMPARISON:  03/15/2011  FINDINGS: Mild stable atrophy. No infarct mass hydrocephalus hemorrhage or extra-axial fluid. Calvarium intact.  IMPRESSION: Negative   Electronically Signed   By: Esperanza Heir M.D.   On: 09/08/2013 23:16   Ct Abdomen Pelvis W Contrast  09/06/2013   CLINICAL DATA:  58 year old male with abdominal pain nausea vomiting diarrhea weakness. Initial encounter. Diabetes.  EXAM: CT ABDOMEN  AND PELVIS WITH CONTRAST  TECHNIQUE: Multidetector CT imaging of the abdomen and pelvis was performed using the standard protocol following bolus administration of intravenous contrast.  CONTRAST:  50mL OMNIPAQUE IOHEXOL 300 MG/ML SOLN, OMNIPAQUE IOHEXOL 300 MG/ML SOLN  COMPARISON:  Abdomen radiographs 09/23/2011.  FINDINGS: Trace or  small bilateral layering pleural effusions. No pericardial effusion. Minor dependent scarring or atelectasis at the lung bases.  Facet degeneration in the lumbar spine. No acute osseous abnormality identified.  No pelvic free fluid. Negative rectum. Redundant sigmoid colon tracking to the right lower quadrant, but otherwise negative. Retained stool in the left colon with no wall thickening. Retained stool in the transverse colon. Mildly redundant flexures. Normal right colon and appendix. Negative terminal ileum. Oral contrast has not yet reached the TI. No dilated or inflamed small bowel identified. Decompressed stomach and duodenum.  Negative liver, gallbladder, spleen, pancreas (intermittently fatty infiltrated), and adrenal glands. No abdominal free fluid. Suboptimal intravascular contrast timing. Aortoiliac calcified atherosclerosis noted. Major arterial structures in the abdomen and pelvis appear to remain patent. Negative kidneys with normal symmetric contrast excretion on delayed images. Mild bilateral perinephric stranding could be related to a age or a degree of intrinsic chronic renal disease (e.g. due to diabetes). No lymphadenopathy.  IMPRESSION: No acute or inflammatory findings in the abdomen or pelvis. Small/trace layering pleural effusions.   Electronically Signed   By: Augusto Gamble M.D.   On: 09/06/2013 09:02   Dg Foot Complete Left  09/08/2013   CLINICAL DATA:  Foot infection.  EXAM: LEFT FOOT - COMPLETE 3+ VIEW  COMPARISON:  Left foot radiographs January 10, 2011  FINDINGS: 9 mm linear radiopaque foreign body within the medial plantar aspect of the a second  distal phalanx soft tissues. No acute fracture deformity. No dislocation. Status post proximal aspect of the fifth metatarsus fracture with new bony second/ third metatarsal fusion. First metatarsal sesamoid osteoarthrosis. The Sclerosis and nondisplaced fracture of second metatarsal diaphysis. Small plantar calcaneal spur. Mid and forefoot dorsal soft tissue swelling without subcutaneous gas. Moderate vascular calcifications.  IMPRESSION: Subcentimeter linear radiopaque foreign bodies within second digit distal soft tissues may reflect a needle fragment.  Subacute stress fracture of second metatarsus. No acute fracture deformity or dislocation.  Status post base of fifth metatarsus amputation. No definite radiographic findings of osteomyelitis though, MRI would be more sensitive.   Electronically Signed   By: Awilda Metro   On: 09/08/2013 23:23   Dg Foot Complete Right  09/08/2013   CLINICAL DATA:  Foot infection.  EXAM: RIGHT FOOT COMPLETE - 3+ VIEW  COMPARISON:  None.  FINDINGS: First digit and proximal first metatarsal subcutaneous gas. Moderate vascular calcifications. Erosive changes of the medial aspect of the first proximal and distal phalanx medially. Possible subcutaneous gas within the fifth digit. Limited evaluation due to overlying bandage.  Remote displaced distal fifth metatarsus fracture with foreshortening. Chronic nondisplaced base of fourth metatarsus fracture. Severe fourth metatarsophalangeal osteoarthrosis. Moderate plantar calcaneal spur.  IMPRESSION: First digit subcutaneous gas with underlying erosions likely highly concerning for osteomyelitis, considering the location superimposed septic joint may be present. Possible subcutaneous gas fifth digit soft tissues.  Remote base of fourth and distal fifth metatarsal fractures without acute fracture deformity or dislocation.   Electronically Signed   By: Awilda Metro   On: 09/08/2013 23:28    Microbiology: Recent Results (from  the past 240 hour(s))  CULTURE, BLOOD (ROUTINE X 2)     Status: None   Collection Time    09/09/13  1:01 AM      Result Value Ref Range Status   Specimen Description BLOOD LEFT ARM   Final   Special Requests BOTTLES DRAWN AEROBIC AND ANAEROBIC 8CC   Final   Culture  Setup Time     Final  Value: 09/09/2013 09:30     Performed at [facility]   Culture     Final   Value:        BLOOD CULTURE RECEIVED NO GROWTH TO DATE CULTURE WILL BE HELD FOR 5 DAYS BEFORE ISSUING A FINAL NEGATIVE REPORT     Performed at [facility]   Report Status PENDING   Incomplete  CULTURE, BLOOD (ROUTINE X 2)     Status: None   Collection Time    09/09/13  1:10 AM      Result Value Ref Range Status   Specimen Description BLOOD RIGHT HAND   Final   Special Requests BOTTLES DRAWN AEROBIC AND ANAEROBIC 10CC   Final   Culture  Setup Time     Final   Value: 09/09/2013 09:29     Performed at [facility]   Culture     Final   Value:        BLOOD CULTURE RECEIVED NO GROWTH TO DATE CULTURE WILL BE HELD FOR 5 DAYS BEFORE ISSUING A FINAL NEGATIVE REPORT     Performed at [facility]   Report Status PENDING   Incomplete  MRSA PCR SCREENING     Status: None   Collection Time    09/09/13  1:54 AM      Result Value Ref Range Status   MRSA by PCR NEGATIVE  NEGATIVE Final   Comment:            The GeneXpert MRSA Assay (FDA     approved for NASAL specimens     only), is one component of a     comprehensive MRSA colonization     surveillance program. It is not     intended to diagnose MRSA     infection nor to guide or     monitor treatment for     MRSA infections.  CLOSTRIDIUM DIFFICILE BY PCR     Status: None   Collection Time    09/12/13  5:32 AM      Result Value Ref Range Status   C difficile by pcr NEGATIVE  NEGATIVE Final     Labs: Basic Metabolic Panel:  Recent Labs Lab 09/08/13 2208 09/09/13 0109 09/10/13 0340 09/12/13 0530  NA 127* 127* 132* 139  K 3.0* 3.3* 4.5  3.5*  CL 90* 89* 98 103  CO2 21 18* 21 25  GLUCOSE 146* 202* 296* 78  BUN 11 11 18 9   CREATININE 0.93 0.79 0.75 0.88  CALCIUM 7.9* 7.8* 7.4* 7.3*   Liver Function Tests:  Recent Labs Lab 09/08/13 2208  AST 43*  ALT 31  ALKPHOS 77  BILITOT 0.8  PROT 6.2  ALBUMIN 2.5*    Recent Labs Lab 09/08/13 2208  LIPASE 11   No results found for this basename: AMMONIA,  in the last 168 hours CBC:  Recent Labs Lab 09/08/13 2208 09/09/13 0109 09/10/13 0340 09/12/13 0530  WBC 16.4* 16.4* 25.5* 9.3  NEUTROABS 15.0*  --   --   --   HGB 9.4* 9.6* 10.2* 8.9*  HCT 26.7* 27.3* 29.4* 26.6*  MCV 77.2* 76.9* 77.4* 79.2  PLT 282 283 341 413*   Cardiac Enzymes:  Recent Labs Lab 09/08/13 2208  TROPONINI <0.30   BNP: BNP (last 3 results) No results found for this basename: PROBNP,  in the last 8760 hours CBG:  Recent Labs Lab 09/12/13 0740 09/12/13 1157 09/12/13 1721 09/12/13 2123 09/13/13 0729  GLUCAP 84 69* 92  116* 142*       Signed:  Cannan Beeck  [facility] 09/13/2013, 11:32 AM

## 2013-09-13 NOTE — Progress Notes (Addendum)
Subjective: 3 Days Post-Op Procedure(s) (LRB): Right Below Knee Amputation (Right) Patient reports pain as mild.    Objective: Vital signs in last 24 hours: Temp:  [97.3 F (36.3 C)-97.8 F (36.6 C)] 97.8 F (36.6 C) (07/31 0456) Pulse Rate:  [46-54] 48 (07/31 0456) Resp:  [16-17] 17 (07/31 0456) BP: (158-189)/(69-78) 189/77 mmHg (07/31 0456) SpO2:  [94 %-97 %] 94 % (07/31 0456)  Intake/Output from previous day: 07/30 0701 - 07/31 0700 In: 2358.3 [P.O.:1420; I.V.:838.3; IV Piggyback:100] Out: 3975 [Urine:3975] Intake/Output this shift:     Recent Labs  09/12/13 0530  HGB 8.9*    Recent Labs  09/12/13 0530  WBC 9.3  RBC 3.36*  HCT 26.6*  PLT 413*    Recent Labs  09/12/13 0530  NA 139  K 3.5*  CL 103  CO2 25  BUN 9  CREATININE 0.88  GLUCOSE 78  CALCIUM 7.3*   No results found for this basename: LABPT, INR,  in the last 72 hours  PE:  R LE stump incision c/d/i.  5/5 strength at quad and hamstring.  Assessment/Plan: 3 Days Post-Op Procedure(s) (LRB): Right Below Knee Amputation (Right) Up with therapy  Dressing changed today.  Stump shrinker sock and stump protector today.  Pt can be discharged at any time from ortho perspective.  F/u with me in clinic in two weeks if he goes out over the weekend.  NWB R LE.  Dressing changes daily and as needed.  Orders in Epic.  OK to d/c abx from my perspective.  Toni ArthursHEWITT, [name] 09/13/2013, 7:40 AM

## 2013-09-13 NOTE — Progress Notes (Signed)
Called [facility] for report, was transferred but no one picking up the phone. Placed another call to [phone], no one answering phone.

## 2013-09-13 NOTE — Progress Notes (Signed)
  CARE MANAGEMENT ED NOTE 09/13/2013  Patient:  Junious DresserSTEWART,Jvon E   Account Number:  [id number]  Date Initiated:  09/13/2013  Documentation initiated by:  Radford PaxFERRERO,Avamae Dehaan  Subjective/Objective Assessment:   [facility] diabetic osteomyelitis and gangrene     Subjective/Objective Assessment Detail:     Action/Plan:   [facility] and [facility]   Action/Plan Detail:   Anticipated DC Date:  09/13/2013     Status Recommendation to Physician:   Result of Recommendation:    Other ED Services  Consult Working Plan    DC Planning Services  CM consult  Other    Choice offered to / List presented to:            Status of service:  Completed, signed off  ED Comments:   ED Comments Detail:  [facility] received phone call from [place] at [facility] [place].  As per Burna MortimerWanda, page three from patient's discharge summary is missing.  [facility] AVS and Physician discharge summary to fax number [phone] at 1849pm with confirmation of receipt at 1850pm.  [facility] called Burna MortimerWanda at [phone] who confirms she has received the fax and was exactly what she needed.  No further EDCM needs at this time.

## 2013-09-13 NOTE — ED Notes (Signed)
Pt. reports right leg pain onset today , pt. had a right BKA  last 09/10/2013 , pt. stated he was not given his pain medication at the nursing home this evening .

## 2013-09-13 NOTE — Clinical Social Work Note (Signed)
CSW met with pt to discuss discharge planning. Pt stated he has chose [facility] SN[facility]. CSW has faxed discharge summary to [facility]. Discharge packet is complete and placed on pt's shadow chart. EMS [facility]) has been scheduled for 2pm on 09/13/2013 due to bed availability at [facility].  RN to please call report to [facility] [place], MSW, [facility] Licensed Clinical Social Worker [phone] and [phone]

## 2013-09-14 ENCOUNTER — Emergency Department (HOSPITAL_COMMUNITY): Payer: Medicaid Other

## 2013-09-14 LAB — COMPREHENSIVE METABOLIC PANEL
ALBUMIN: 2.4 g/dL — AB (ref 3.5–5.2)
ALT: 29 U/L (ref 0–53)
AST: 21 U/L (ref 0–37)
Alkaline Phosphatase: 66 U/L (ref 39–117)
Anion gap: 12 (ref 5–15)
BUN: 5 mg/dL — ABNORMAL LOW (ref 6–23)
CALCIUM: 7.7 mg/dL — AB (ref 8.4–10.5)
CO2: 27 meq/L (ref 19–32)
Chloride: 100 mEq/L (ref 96–112)
Creatinine, Ser: 0.74 mg/dL (ref 0.50–1.35)
GFR calc Af Amer: >90 mL/min (ref >90)
GFR calc non Af Amer: >90 mL/min (ref >90)
Glucose, Bld: 179 mg/dL — ABNORMAL HIGH (ref 70–99)
Potassium: 3.4 mEq/L — ABNORMAL LOW (ref 3.7–5.3)
SODIUM: 139 meq/L (ref 137–147)
Total Bilirubin: 0.3 mg/dL (ref 0.3–1.2)
Total Protein: 5.8 g/dL — ABNORMAL LOW (ref 6.0–8.3)

## 2013-09-14 LAB — CBC WITH DIFFERENTIAL/PLATELET
BASOS ABS: 0 10*3/uL (ref 0.0–0.1)
BASOS PCT: 0 % (ref 0–1)
EOS PCT: 6 % — AB (ref 0–5)
Eosinophils Absolute: 0.6 10*3/uL (ref 0.0–0.7)
HCT: 29.1 % — ABNORMAL LOW (ref 39.0–52.0)
Hemoglobin: 10.2 g/dL — ABNORMAL LOW (ref 13.0–17.0)
LYMPHS PCT: 14 % (ref 12–46)
Lymphs Abs: 1.4 10*3/uL (ref 0.7–4.0)
MCH: 27.5 pg (ref 26.0–34.0)
MCHC: 35.1 g/dL (ref 30.0–36.0)
MCV: 78.4 fL (ref 78.0–100.0)
Monocytes Absolute: 0.7 10*3/uL (ref 0.1–1.0)
Monocytes Relative: 7 % (ref 3–12)
Neutro Abs: 7.2 10*3/uL (ref 1.7–7.7)
Neutrophils Relative %: 73 % (ref 43–77)
PLATELETS: 549 10*3/uL — AB (ref 150–400)
RBC: 3.71 MIL/uL — ABNORMAL LOW (ref 4.22–5.81)
RDW: 14.7 % (ref 11.5–15.5)
WBC: 10 10*3/uL (ref 4.0–10.5)

## 2013-09-14 LAB — URINALYSIS, ROUTINE W REFLEX MICROSCOPIC
Bilirubin Urine: NEGATIVE
GLUCOSE, UA: NEGATIVE mg/dL
Hgb urine dipstick: NEGATIVE
KETONES UR: NEGATIVE mg/dL
LEUKOCYTES UA: NEGATIVE
NITRITE: NEGATIVE
PH: 6 (ref 5.0–8.0)
Protein, ur: NEGATIVE mg/dL
SPECIFIC GRAVITY, URINE: 1.008 (ref 1.005–1.030)
Urobilinogen, UA: 0.2 mg/dL (ref 0.0–1.0)

## 2013-09-14 LAB — LIPASE, BLOOD: Lipase: 13 U/L (ref 11–59)

## 2013-09-14 LAB — TROPONIN I: Troponin I: <0.3 ng/mL (ref <0.30)

## 2013-09-14 LAB — CBG MONITORING, ED: GLUCOSE-CAPILLARY: 215 mg/dL — AB (ref 70–99)

## 2013-09-14 MED ORDER — PREGABALIN 75 MG PO CAPS
75.0000 mg | ORAL_CAPSULE | Freq: Three times a day (TID) | ORAL | Status: DC
  Administered 2013-09-14: 75 mg via ORAL
  Filled 2013-09-14: qty 1

## 2013-09-14 MED ORDER — INSULIN ASPART 100 UNIT/ML ~~LOC~~ SOLN
0.0000 [IU] | Freq: Three times a day (TID) | SUBCUTANEOUS | Status: DC
  Administered 2013-09-14: 5 [IU] via SUBCUTANEOUS
  Filled 2013-09-14: qty 1

## 2013-09-14 MED ORDER — FLUDROCORTISONE ACETATE 0.1 MG PO TABS
0.2000 mg | ORAL_TABLET | Freq: Every day | ORAL | Status: DC
  Administered 2013-09-14: 0.2 mg via ORAL
  Filled 2013-09-14: qty 2

## 2013-09-14 MED ORDER — SENNA 8.6 MG PO TABS
2.0000 | ORAL_TABLET | Freq: Two times a day (BID) | ORAL | Status: DC
  Administered 2013-09-14: 17.2 mg via ORAL
  Filled 2013-09-14 (×2): qty 2

## 2013-09-14 MED ORDER — FLUOXETINE HCL 20 MG PO CAPS
60.0000 mg | ORAL_CAPSULE | Freq: Every day | ORAL | Status: DC
  Administered 2013-09-14: 60 mg via ORAL
  Filled 2013-09-14: qty 3

## 2013-09-14 MED ORDER — FLUOXETINE HCL 20 MG PO TABS: 20.0000 mg | ORAL_TABLET | Freq: Every day | ORAL | Status: DC

## 2013-09-14 MED ORDER — GABAPENTIN 300 MG PO CAPS: 300.0000 mg | ORAL_CAPSULE | Freq: Three times a day (TID) | ORAL | Status: DC | PRN

## 2013-09-14 MED ORDER — CLONAZEPAM 0.5 MG PO TABS
0.5000 mg | ORAL_TABLET | Freq: Three times a day (TID) | ORAL | Status: DC
  Administered 2013-09-14: 0.5 mg via ORAL
  Filled 2013-09-14: qty 1

## 2013-09-14 MED ORDER — OXYCODONE HCL 5 MG PO TABS
5.0000 mg | ORAL_TABLET | Freq: Four times a day (QID) | ORAL | Status: DC | PRN
  Administered 2013-09-14: 10 mg via ORAL
  Filled 2013-09-14: qty 2

## 2013-09-14 MED ORDER — CLONAZEPAM 1 MG PO TABS: 1.0000 mg | ORAL_TABLET | Freq: Two times a day (BID) | ORAL | Status: DC | PRN

## 2013-09-14 MED ORDER — DIPHENHYDRAMINE HCL 25 MG PO CAPS: 25.0000 mg | ORAL_CAPSULE | Freq: Every evening | ORAL | Status: DC | PRN

## 2013-09-14 MED ORDER — FLUTICASONE PROPIONATE 50 MCG/ACT NA SUSP
1.0000 | Freq: Every day | NASAL | Status: DC
  Filled 2013-09-14: qty 16

## 2013-09-14 MED ORDER — ASPIRIN-ACETAMINOPHEN-CAFFEINE 250-250-65 MG PO TABS
1.0000 | ORAL_TABLET | Freq: Four times a day (QID) | ORAL | Status: DC | PRN
  Filled 2013-09-14: qty 1

## 2013-09-14 MED ORDER — ASPIRIN EC 81 MG PO TBEC
81.0000 mg | DELAYED_RELEASE_TABLET | Freq: Every day | ORAL | Status: DC
  Administered 2013-09-14: 81 mg via ORAL
  Filled 2013-09-14: qty 1

## 2013-09-14 MED ORDER — INSULIN GLARGINE 100 UNIT/ML ~~LOC~~ SOLN
20.0000 [IU] | Freq: Every day | SUBCUTANEOUS | Status: DC
  Filled 2013-09-14: qty 0.2

## 2013-09-14 MED ORDER — OXYCODONE HCL 5 MG PO TABS: ORAL_TABLET | ORAL | Status: DC

## 2013-09-14 MED ORDER — INSULIN ASPART 100 UNIT/ML ~~LOC~~ SOLN: 0.0000 [IU] | Freq: Every day | SUBCUTANEOUS | Status: DC

## 2013-09-14 MED ORDER — PANTOPRAZOLE SODIUM 40 MG PO TBEC
40.0000 mg | DELAYED_RELEASE_TABLET | Freq: Every day | ORAL | Status: DC
  Administered 2013-09-14: 40 mg via ORAL
  Filled 2013-09-14: qty 1

## 2013-09-14 MED ORDER — PREDNISONE 20 MG PO TABS
10.0000 mg | ORAL_TABLET | Freq: Every day | ORAL | Status: DC
  Administered 2013-09-14: 10 mg via ORAL
  Filled 2013-09-14: qty 1

## 2013-09-14 MED ORDER — CLONAZEPAM 0.5 MG PO TABS: 1.0000 mg | ORAL_TABLET | Freq: Two times a day (BID) | ORAL | Status: DC | PRN

## 2013-09-14 MED ORDER — ONDANSETRON HCL 4 MG PO TABS: 8.0000 mg | ORAL_TABLET | Freq: Three times a day (TID) | ORAL | Status: DC | PRN

## 2013-09-14 NOTE — ED Provider Notes (Addendum)
  This was a shared visit with a mid-level provided (NP or PA).  Throughout the patient's course I was available for consultation/collaboration.  I saw the ECG (if appropriate), relevant labs and studies - I agree with the interpretation.  On my exam the patient was in no distress.  He presents several hours after his discharge from this facility and admission to his rehabilitation facility concerns largely of care provided there.  On evaluation today the patient has no evidence for recurrent infection, the wound dehisced since.  Patient was returned to his facility after he and I had a conversation about working with care providers there, and considering other options for rehabilitation after the weekend.       Gerhard Munchobert Kynesha Guerin, MD 09/14/13 0429  5:14 AM The patient's facility has refused to accept him back. We are attempting to contact our case managers to facilitate disposition. There is no indication for repeat admission.   Gerhard Munchobert Faith Patricelli, MD 09/14/13 [phone]

## 2013-09-14 NOTE — ED Notes (Signed)
[facility] facility is not willing to accept patient.  Informed Dr. Jeraldine LootsLockwood, and will contact case management.

## 2013-09-14 NOTE — ED Notes (Signed)
Ursula BeathCaitlin M. is available from 8am-5pm at [phone] to follow case for placement.

## 2013-09-14 NOTE — ED Notes (Signed)
Case management paged

## 2013-09-14 NOTE — Progress Notes (Addendum)
Per Peter Kiewit Sonsammy [facility] liaison they [facility]. Per Tammy patient is going to room 125 B and RN will call report to Toniann FailWendy at facility. Clinical Child psychotherapistocial Worker (CSW) prepared D/C packet and sent D/C to Tammy via carefinder. Tammy requested that [facility] do an LOG incase [facility] is denied when it is resumbitted on 10/11/23. CSW discussed LOG with Clinical Supervisor Zack who is in agreement. CSW left message with patient's mother Amada JupiterLois Kondracki. Nursing aware of above. Please reconsult if future social work needs arise. CSW signing off.   Jetta LoutBailey Morgan, LCSWA Weekend CSW [phone]

## 2013-09-14 NOTE — Addendum Note (Signed)
Addendum created 09/14/13 1406 by Fransisca KaufmannMary E Journee Kohen, CRNA   Modules edited: Anesthesia Events

## 2013-09-14 NOTE — ED Notes (Signed)
[facility] AT BEDSIDE TO TRANSPORT

## 2013-09-14 NOTE — ED Notes (Signed)
Discussed with Marissa, PA-C, that patient showing signs of depression related to his placement in last facility.  He is also concerned about new dressings and would like her to assess them.  Marissa acknowledges, no new orders at this time.

## 2013-09-14 NOTE — ED Notes (Signed)
Called x-ray, transport will be coming to get patient soon.

## 2013-09-14 NOTE — ED Notes (Signed)
SOCIAL WORK HERE TO SEE PT

## 2013-09-14 NOTE — ED Notes (Signed)
Gave patient a diet coke as requested.

## 2013-09-14 NOTE — ED Notes (Signed)
Marissa, PA-C at the bedside.

## 2013-09-14 NOTE — ED Notes (Signed)
Called receiving facility, they are following up with DON and will return call.

## 2013-09-14 NOTE — Discharge Instructions (Signed)
Please call your doctor for a followup appointment within 24-48 hours. When you talk to your doctor please let them know that you were seen in the emergency department and have them acquire all of your records so that they can discuss the findings with you and formulate a treatment plan to fully care for your new and ongoing problems. Please call and set-up an appointment with your primary care provider Please rest and stay hydrated Please avoid any physical or strenuous activity  Please continue to take medications at home as prescribed Please continue to monitor symptoms closely and if symptoms are to worsen or change (fever greater than 101, chills, sweating, nausea, vomiting, chest pain, shortness of breath, difficulty breathing, abdominal pain, pain with urination, blood in the urine, black tarry stools, bright red blood in the stools, swelling to the leg, sutures come undone, opening of the wound, bleeding/drainage from the sites, red streaks) please report back to the ED immediately     Emergency Department Resource Guide 1) Find a Doctor and Pay Out of Pocket Although you won't have to find out who is covered by your insurance plan, it is a good idea to ask around and get recommendations. You will then need to call the office and see if the doctor you have chosen will accept you as a new patient and what types of options they offer for patients who are self-pay. Some doctors offer discounts or will set up payment plans for their patients who do not have insurance, but you will need to ask so you aren't surprised when you get to your appointment.  2) Contact Your Local Health Department Not all health departments have doctors that can see patients for sick visits, but many do, so it is worth a call to see if yours does. If you don't know where your local health department is, you can check in your phone book. The CDC also has a tool to help you locate your state's health department, and many  state websites also have listings of all of their local health departments.  3) Find a Walk-in Clinic If your illness is not likely to be very severe or complicated, you may want to try a walk in clinic. These are popping up all over the country in pharmacies, drugstores, and shopping centers. They're usually staffed by nurse practitioners or physician assistants that have been trained to treat common illnesses and complaints. They're usually fairly quick and inexpensive. However, if you have serious medical issues or chronic medical problems, these are probably not your best option.  No Primary Care Doctor: - Call Health Connect at  [phone] - they can help you locate a primary care doctor that  accepts your insurance, provides certain services, etc. - Physician Referral Service- [phone]  Chronic Pain Problems: Organization         Address  Phone   Notes  [facility]  [phone] Patients need to be referred by their primary care doctor.   Medication Assistance: Organization         Address  Phone   Notes  [facility] Medication [facility] [address] [place] [place], [name] [address] [phone] --Must be a resident of [facility] -- Must have NO insurance coverage whatsoever (no Medicaid/ Medicare, etc.) -- The pt. MUST have a primary care doctor that directs their care regularly and follows them in the community   [facility]  [phone]   [address]  [phone]  Agencies that provide inexpensive medical care: Organization         Address  Phone   Notes  [facility]  [phone]   [facility]    [phone]   [facility] [address] [place], [place] [address] [phone](336) [phone]   [facility] [address], [place] [phone]) [phone]   [facility]anned Parenthood    [phone]) [phone]   [facility]    [phone]   Community Health and  [facility]  [address], [place] Phone:  [phone]) [phone], Fax:  [phone](336) [phone] Hours of Operation:  9 am - 6 pm, M-F.  Also accepts Medicaid/Medicare and self-pay.  [facility]  [address], [place] Phone: [phone]) [phone], Fax: [phone](336) [phone]. Hours of Operation:  8:30 am - 5:30 pm, M-F.  Also accepts Medicaid and self-pay.  [facility] [place] [address], [place] Phone: [phone](336) [phone]   [facility] [address], [place], [place] [phone], Ext. [phone] Mondays & Thursdays: 7-9 AM.  First 15 patients are seen on a first come, first serve basis.    Medicaid-accepting [facility] Providers:  Organization         Address  Phone   Notes  [facility] [address], [place] [phone]) [phone] Also accepts self-pay patients.  [facility] [address], [place]  [phone]) [phone]   [facility] [address], [place] [phone]) [phone]   [facility] [address] [place] [address], [place] [phone]) [phone]   Renaye RakersVeita Bland [address], [place]   [phone]) [phone] Only accepts [place] Access [place] patients after they have their name applied to their card.   Self-Pay (no insurance) in [facility]:  Organization         Address  Phone   Notes  Sickle Cell Patients, [facility] [address], [place] [phone]) [phone]   [facility] [address], [place] [phone]) [phone]   [facility]  [address], [place] [phone]   [facility]/Dr. Osei-Bonsu  [address] [place] [address], [place] or [phone], [place] [phone]) [phone] Phone number for both [place] and [place] locations is the same.  [facility] and [facility] [address], [place] [phone](336) [phone]   [facility] [place] [address]  [place] [address], [place] or [address] [phone]) [phone](336) [phone]   [facility] [address], [place] [phone](336) [phone], phone; [phone]) [phone], fax Sees patients 1st and 3rd Saturday of every month.  Must not qualify for public or private insurance (i.e. Medicaid, Medicare, [place] Health Choice, Veterans' Benefits)  Household income should be no more than 200% of the poverty level The clinic cannot treat you if you are pregnant or think you are pregnant  Sexually transmitted diseases are not treated at the clinic.    Dental Care: Organization         Address  Phone  Notes  [facility] [address], [place] [phone]) [phone] Accepts children up to age 56 who are enrolled in [place] or [place] Health Choice; pregnant women with a Medicaid card; and children who have applied for Medicaid or  [place] Health Choice, but were declined, whose parents can pay a reduced fee at time of service.  [facility] Department of [facility]  [address], [place] [phone] Accepts children up to age 33 who are enrolled in [place] or [place] Health Choice; pregnant women with a Medicaid card; and children who have applied for Medicaid or [place] Health Choice, but were declined, whose parents can pay a reduced fee at time of service.  [facility]  [address] [place], [place] [phone] Patients are seen by appointment only. Walk-ins are not accepted. [facility] will see patients 25 years of age and older. Monday - Tuesday (8am-5pm) Most Wednesdays (8:30-5pm) $30 per visit, cash only  [facility] Adult Dental Access PRO[facility]  [address], [facility] [phone] Patients are seen by appointment only. Walk-ins are not accepted. [facility] will see patients 50 years of age and older. One Wednesday Evening (Monthly: Volunteer Based).  $30 per visit, cash only  [employer]  [phone] for adults; Children under age 74, call Graduate Pediatric Dentistry at [phone]. Children aged 33-14, please call [phone] to request a pediatric application.  Dental services are provided in all areas of dental care including fillings, crowns and bridges, complete and partial dentures, implants, gum treatment, root canals, and extractions. Preventive care is also provided. Treatment is provided to both adults and children. Patients are selected via a lottery and there is often a waiting list.   [facility] [address] [place]  [phone] [email or web address]   [facility] [address] [place], [name] [phone], Ext. [phone] Second and Fourth Thursday of each month, opens at 6:30 AM; Clinic ends at 9 AM.  Patients are seen on a first-come first-served basis, and a limited number are seen during each clinic.   [facility]  [address] [place], [name] [phone]   Eligibility Requirements You must have lived in [place], [place], or [place] counties for at least the last three months.   You cannot be eligible for state or federal sponsored [place], including [employer], [place], or [employer].   You generally cannot be eligible for healthcare insurance through your employer.    How to apply: Eligibility screenings are held every Tuesday and Wednesday afternoon from 1:00 pm until 4:00 pm. You do not need an appointment for the interview!  [facility] [address], [place], [name] [phone]   [facility]  [phone]   [facility]  [phone]   [facility] Health Department  [phone]    Behavioral Health Resources in the Community: Intensive Outpatient Programs Organization         Address  Phone  Notes  [facility] [address], [place], [name]  [phone]   [facility] Outpatient [address] [place], [name] [phone]   ADS: Alcohol & Drug Svcs [address] [place], [name]  [phone]   [facility] Mental Health [address]  [place], [name] [phone] or [phone]   Substance Abuse Resources Organization         Address  Phone  Notes  [facility] Drug Services  [phone]   [facility]  [phone]   [facility] [place]  [phone]   Floydene Flock  [phone]   Residential & Outpatient Substance Abuse Program  [phone]  Psychological Services Organization         Address  Phone  Notes  [facility]  [phone]   [facility]  [phone]   [facility] Mental Health [phone] [address] [place] [phone] or [phone]    Mobile Crisis Teams Organization         Address  Phone  Notes  [facility], Mobile Crisis Care Unit  [phone]   [facility]  [address] [place], [name] [phone]   Doristine Locks [address] [name] [phone]    Self-Help/Support Groups Organization         Address  Phone             Notes  Mental Health Assoc. of  - variety of support groups  [phone]- I7437963 Call for more information  Narcotics Anonymous (NA), [facility] [address], [place]  2 meetings at this location   Statistician         Address  Phone  Notes  [facility] [address],    [name]  [phone]   [facility]  [address] [place] [address], [place], [name] [phone]   [facility] [address] [place], [place] [phone] Admissions: 8am-3pm M-F  [facility] [address],    [place], [name] [phone]   [facility] [address] [place], [place], [name] [phone]   [facility] [address]    [place], [name] [phone]   [facility] - Intensive Outpatient [address], [place], [name] [phone]   [facility] ([facility]iction Recovery [facility] [address]  [place], [name] [phone] or [phone]   [facility] (RTS) [address] [place], [name] [phone] Accepts Medicaid  [facility] [address]  [name] [phone] Substance Abuse/Addiction Treatment   [facility] Organization         Address  Phone  Notes  [facility]  [phone]   Angie Fava, PhD [address] [place], [name]   [phone] or [phone]   [facility]   [address] [place], [name] [phone]   [facility] [address] [place], [name] [phone] Insurance/Medicaid/sponsorship through [facility] [address]                                    [place], [name] [phone] Therapy/tele-psych/case  [facility] [address] [place] [address], [name] [phone]    Dr. Lolly Mustache  [phone]   [facility] [place]  [employer] [facility] Dept. 1) [address], [place] 2) [address] [place] 3)  [address], [place] [phone]  [phone]   [facility] Child Abuse Hotline 651-706-6539 or [phone] (After Hours)

## 2013-09-14 NOTE — ED Notes (Signed)
Patient unsure the name of the [facility], [facility] [address] [place]

## 2013-09-14 NOTE — ED Notes (Signed)
Updated Dr. Jeraldine LootsLockwood on patient status, social worker will be available to follow up this morning at 8am.

## 2013-09-14 NOTE — ED Notes (Signed)
Awaiting PTAR. [facility]

## 2013-09-14 NOTE — ED Notes (Signed)
[facility] for [facility]. Social worker called and message left in regards to placement with call back numbers: [phone] and [phone].

## 2013-09-14 NOTE — Progress Notes (Signed)
Clinical Child psychotherapistocial Worker (CSW) received voicemail from [employer] stating needs [facility]F placement and [facility]. CSW contacted Milagros ReapMary [place] [facility] RN [facility] who first reported [facility] then she talked with the facility and they are refusing to take him back. Per Corrie DandyMary she will find out why they can't accept him back and notify CSW. CSW [facility] to see if they can accept patient. Tammy will review case and call CSW back. CSW will continue to follow and assist.   Jetta LoutBailey Morgan, Theresia MajorsLCSWA Weekend CSW [phone]

## 2013-09-15 LAB — CULTURE, BLOOD (ROUTINE X 2)
Culture: NO GROWTH
Culture: NO GROWTH

## 2013-09-17 ENCOUNTER — Non-Acute Institutional Stay (SKILLED_NURSING_FACILITY): Payer: Medicaid Other | Admitting: Internal Medicine

## 2013-09-17 ENCOUNTER — Other Ambulatory Visit: Payer: Self-pay | Admitting: *Deleted

## 2013-09-17 DIAGNOSIS — M869 Osteomyelitis, unspecified: Secondary | ICD-10-CM

## 2013-09-17 DIAGNOSIS — R5383 Other fatigue: Secondary | ICD-10-CM

## 2013-09-17 DIAGNOSIS — E1169 Type 2 diabetes mellitus with other specified complication: Secondary | ICD-10-CM

## 2013-09-17 DIAGNOSIS — E1140 Type 2 diabetes mellitus with diabetic neuropathy, unspecified: Secondary | ICD-10-CM

## 2013-09-17 DIAGNOSIS — E2749 Other adrenocortical insufficiency: Secondary | ICD-10-CM

## 2013-09-17 DIAGNOSIS — E1149 Type 2 diabetes mellitus with other diabetic neurological complication: Secondary | ICD-10-CM

## 2013-09-17 DIAGNOSIS — R531 Weakness: Secondary | ICD-10-CM

## 2013-09-17 DIAGNOSIS — E1142 Type 2 diabetes mellitus with diabetic polyneuropathy: Secondary | ICD-10-CM

## 2013-09-17 DIAGNOSIS — E271 Primary adrenocortical insufficiency: Secondary | ICD-10-CM

## 2013-09-17 DIAGNOSIS — E11621 Type 2 diabetes mellitus with foot ulcer: Secondary | ICD-10-CM

## 2013-09-17 DIAGNOSIS — M9080 Osteopathy in diseases classified elsewhere, unspecified site: Secondary | ICD-10-CM

## 2013-09-17 DIAGNOSIS — R5381 Other malaise: Secondary | ICD-10-CM

## 2013-09-17 DIAGNOSIS — L97509 Non-pressure chronic ulcer of other part of unspecified foot with unspecified severity: Secondary | ICD-10-CM

## 2013-09-17 DIAGNOSIS — I10 Essential (primary) hypertension: Secondary | ICD-10-CM

## 2013-09-17 DIAGNOSIS — F4310 Post-traumatic stress disorder, unspecified: Secondary | ICD-10-CM

## 2013-09-17 MED ORDER — OXYCODONE HCL 5 MG PO TABS: 5.0000 mg | ORAL_TABLET | Freq: Four times a day (QID) | ORAL | Status: DC | PRN

## 2013-09-17 MED ORDER — PREGABALIN 75 MG PO CAPS: 75.0000 mg | ORAL_CAPSULE | Freq: Three times a day (TID) | ORAL | Status: DC

## 2013-09-17 MED ORDER — CLONAZEPAM 1 MG PO TABS: ORAL_TABLET | ORAL | Status: DC

## 2013-09-17 NOTE — Telephone Encounter (Signed)
[facility] 
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
## 2013-09-18 ENCOUNTER — Other Ambulatory Visit: Payer: Self-pay | Admitting: *Deleted

## 2013-09-18 MED ORDER — OXYCODONE HCL 5 MG PO TABS: ORAL_TABLET | ORAL | Status: DC

## 2013-09-18 NOTE — Telephone Encounter (Signed)
[facility] 

## 2013-09-21 ENCOUNTER — Encounter: Payer: Self-pay | Admitting: Internal Medicine

## 2013-09-23 NOTE — Care Management ED Note (Signed)
      CARE MANAGEMENT ED NOTE 09/15/2013  Patient:  [name],Gregory E   Account Number:  [id number]  Date Initiated:  09/14/2013  Documentation initiated by:  [facility] HospitalJEFFRIES,Annarose Ouellet  Subjective/Objective Assessment:     Subjective/Objective Assessment Detail:   Pt comes from SNF     Action/Plan:   CSW called   Action/Plan Detail:   Pt needs to be placed.  CSW placed pt at snf.   Anticipated DC Date:  09/14/2013     Status Recommendation to Physician:   Result of Recommendation:        Choice offered to / List presented to:            Status of service:  Completed, signed off  ED Comments:   ED Comments Detail:

## 2013-09-27 ENCOUNTER — Non-Acute Institutional Stay (SKILLED_NURSING_FACILITY): Payer: Medicaid Other | Admitting: Internal Medicine

## 2013-09-27 ENCOUNTER — Encounter: Payer: Self-pay | Admitting: Internal Medicine

## 2013-09-27 DIAGNOSIS — E119 Type 2 diabetes mellitus without complications: Secondary | ICD-10-CM

## 2013-09-27 DIAGNOSIS — E1165 Type 2 diabetes mellitus with hyperglycemia: Secondary | ICD-10-CM

## 2013-09-27 NOTE — Progress Notes (Signed)
Patient ID: [name], male   DOB: 1955-04-15, 58 y.o.   MRN: [id number]  [facility] Provider:  Oneal GroutMahima Lakena Sparlin MD  Code Status:  DNR  Chief Complaint  Patient presents with  . Acute Visit    Review CBG's states that lantus at higher dose at  home.    HPI:  58 y.o. 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 He is seen today for elevated blood sugar readings. He mentions that he was at higher dosing of insulin at home. He was on 30 u lantus with SSI. Currently on 20 u lantus here with 5 u premeal novolog. cbg on review 140-470 with most in 200s. One cbg reading of 70  ROS: Constitutional: Negative for fever.  Eyes: Negative for blurred vision.  Respiratory: Negative for shortness of breath.   Cardiovascular: Negative for chest pain and leg swelling.  Gastrointestinal: Negative for abdominal pain, constipation, blood in stool and melena.  Genitourinary: Negative for dysuria, urgency and frequency.  Musculoskeletal: Negative for falls.   Skin: Negative for rash.  Neurological: Negative for weakness and headaches.  Psychiatric/Behavioral: Negative for memory loss.    Medications: Patient's Medications  New Prescriptions   No medications on file  Previous Medications   ASPIRIN EC 81 MG TABLET    Take 81 mg by mouth daily.   ASPIRIN-ACETAMINOPHEN-CAFFEINE (EXCEDRIN MIGRAINE) 250-250-65 MG PER TABLET    Take 1 tablet by mouth every 6 (six) hours as needed. Migraine   CLONAZEPAM (KLONOPIN) 0.5 MG TABLET    Take 1 tablet (0.5 mg total) by mouth 3 (three) times daily.   CLONAZEPAM (KLONOPIN) 1 MG TABLET    Take one tablet by mouth every 12 hours as needed for anxiety   FLUDROCORTISONE (FLORINEF) 0.1 MG TABLET    Take 0.2 mg by mouth daily.    FLUOXETINE (PROZAC) 20 MG CAPSULE     Take 60 mg by mouth daily.   FLUTICASONE (FLONASE) 50 MCG/ACT NASAL SPRAY    Place 1 spray into the nose daily as needed. For allergies   GABAPENTIN (NEURONTIN) 300 MG CAPSULE    Take 300 mg by mouth 3 (three) times daily as needed (neuropathy).   INSULIN GLARGINE (LANTUS) 100 UNIT/ML INJECTION    Inject 0.2 mLs (20 Units total) into the skin at bedtime.   ONDANSETRON (ZOFRAN) 4 MG TABLET    Take 8 mg by mouth every 6 (six) hours as needed. For nausea   OXYCODONE (ROXICODONE) 5 MG IMMEDIATE RELEASE TABLET    Take one to two tablets by mouth every 4 hours as needed for pain   PANTOPRAZOLE (PROTONIX) 40 MG TABLET    Take 40 mg by mouth daily.   PREDNISONE (DELTASONE) 10 MG TABLET    Take 10 mg by mouth daily with breakfast.   PREGABALIN (LYRICA) 75 MG CAPSULE    Take 1 capsule (75 mg total) by mouth 3 (three) times daily.   SENNA (SENOKOT) 8.6 MG TABS TABLET    Take 2 tablets (17.2 mg total) by mouth 2 (two) times daily.  Modified Medications   No medications on file  Discontinued Medications   No medications on file    Physical Exam: Filed Vitals:   09/27/13 1111  BP: 141/71  Pulse: 66  Temp: 97.6 F (36.4 C)  Resp: 16  Height:  5\' 10"  (1.778 m)  Weight: 200 lb (90.719 kg)   Constitutional: He is oriented to person, place, and time. He appears well-developed and well-nourished. No distress.  Cardiovascular: Normal rate, regular rhythm, normal heart sounds and intact distal pulses.   Pulmonary/Chest: Effort normal and breath sounds normal. No respiratory distress.  Abdominal: Soft. Bowel sounds are normal. He exhibits no distension and no mass. There is no tenderness.  Musculoskeletal: Normal range of motion. He exhibits no edema and no tenderness.  Neurological: He is alert and oriented to person, place, and time. No cranial nerve deficit.  Skin: Right BKA site clean, dry, without significant erythema, warmth, drainage  Psychiatric: calm with normal affect this visit   Labs  reviewed: Basic Metabolic Panel:  Recent Labs  16/10/96 0340 09/12/13 0530 09/14/13 0056  NA 132* 139 139  K 4.5 3.5* 3.4*  CL 98 103 100  CO2 21 25 27   GLUCOSE 296* 78 179*  BUN 18 9 5*  CREATININE 0.75 0.88 0.74  CALCIUM 7.4* 7.3* 7.7*    Liver Function Tests:  Recent Labs  09/06/13 0545 09/08/13 2208 09/14/13 0056  AST 31 43* 21  ALT 11 31 29   ALKPHOS 68 77 66  BILITOT 1.0 0.8 0.3  PROT 6.9 6.2 5.8*  ALBUMIN 3.0* 2.5* 2.4*    CBC:  Recent Labs  09/06/13 0545 09/08/13 2208  09/10/13 0340 09/12/13 0530 09/14/13 0056  WBC 18.0* 16.4*  < > 25.5* 9.3 10.0  NEUTROABS 15.9* 15.0*  --   --   --  7.2  HGB 10.6* 9.4*  < > 10.2* 8.9* 10.2*  HCT 31.4* 26.7*  < > 29.4* 26.6* 29.1*  MCV 80.1 77.2*  < > 77.4* 79.2 78.4  PLT 329 282  < > 341 413* 549*  < > = values in this interval not displayed.   Assessment/Plan  Hyperglycemia Has hx of DM and has elevated blood sugar reading in facility. Will increase his lantus to home dose of 30 u and continue SSI 5 u with meals for now. Monitor cbg and adjust dosing further if needed

## 2013-10-15 ENCOUNTER — Other Ambulatory Visit: Payer: Self-pay | Admitting: *Deleted

## 2013-10-15 MED ORDER — CLONAZEPAM 0.5 MG PO TABS: ORAL_TABLET | ORAL | Status: DC

## 2013-10-15 NOTE — Telephone Encounter (Signed)
[facility] 

## 2013-10-22 ENCOUNTER — Other Ambulatory Visit: Payer: Self-pay | Admitting: *Deleted

## 2013-10-22 MED ORDER — CLONAZEPAM 0.5 MG PO TABS
ORAL_TABLET | ORAL | Status: DC
Start: 2013-10-22 — End: 2013-12-05

## 2013-10-31 ENCOUNTER — Non-Acute Institutional Stay (SKILLED_NURSING_FACILITY): Payer: Medicaid Other | Admitting: Internal Medicine

## 2013-10-31 DIAGNOSIS — E1142 Type 2 diabetes mellitus with diabetic polyneuropathy: Secondary | ICD-10-CM

## 2013-10-31 DIAGNOSIS — E271 Primary adrenocortical insufficiency: Secondary | ICD-10-CM

## 2013-10-31 DIAGNOSIS — E1140 Type 2 diabetes mellitus with diabetic neuropathy, unspecified: Secondary | ICD-10-CM

## 2013-10-31 DIAGNOSIS — I10 Essential (primary) hypertension: Secondary | ICD-10-CM

## 2013-10-31 DIAGNOSIS — E1149 Type 2 diabetes mellitus with other diabetic neurological complication: Secondary | ICD-10-CM

## 2013-10-31 DIAGNOSIS — E2749 Other adrenocortical insufficiency: Secondary | ICD-10-CM
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
## 2013-11-04 ENCOUNTER — Other Ambulatory Visit: Payer: Self-pay | Admitting: Internal Medicine

## 2013-11-04 LAB — LIPID PANEL
Cholesterol: 113 mg/dL (ref 0–200)
HDL: 38 mg/dL — ABNORMAL LOW (ref >39)
LDL Cholesterol: 56 mg/dL (ref 0–99)
Total CHOL/HDL Ratio: 3 Ratio
Triglycerides: 94 mg/dL (ref <150)
VLDL: 19 mg/dL (ref 0–40)

## 2013-11-04 LAB — MICROALBUMIN, URINE: Microalb, Ur: 0.5 mg/dL (ref <2.0)

## 2013-11-15 ENCOUNTER — Non-Acute Institutional Stay (SKILLED_NURSING_FACILITY): Payer: Medicaid Other | Admitting: Internal Medicine

## 2013-11-15 ENCOUNTER — Encounter: Payer: Self-pay | Admitting: Internal Medicine

## 2013-11-15 DIAGNOSIS — E271 Primary adrenocortical insufficiency: Secondary | ICD-10-CM | POA: Diagnosis not present

## 2013-11-15 DIAGNOSIS — I10 Essential (primary) hypertension: Secondary | ICD-10-CM

## 2013-11-15 DIAGNOSIS — F329 Major depressive disorder, single episode, unspecified: Secondary | ICD-10-CM

## 2013-11-15 DIAGNOSIS — G546 Phantom limb syndrome with pain: Secondary | ICD-10-CM

## 2013-11-15 DIAGNOSIS — E1140 Type 2 diabetes mellitus with diabetic neuropathy, unspecified: Secondary | ICD-10-CM | POA: Diagnosis not present

## 2013-11-15 DIAGNOSIS — F411 Generalized anxiety disorder: Secondary | ICD-10-CM

## 2013-11-15 DIAGNOSIS — F32A Depression, unspecified: Secondary | ICD-10-CM | POA: Insufficient documentation
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
## 2013-11-22 ENCOUNTER — Non-Acute Institutional Stay (SKILLED_NURSING_FACILITY): Payer: Medicaid Other | Admitting: Internal Medicine

## 2013-11-22 DIAGNOSIS — F329 Major depressive disorder, single episode, unspecified: Secondary | ICD-10-CM

## 2013-11-22 DIAGNOSIS — F411 Generalized anxiety disorder: Secondary | ICD-10-CM

## 2013-11-22 DIAGNOSIS — F32A Depression, unspecified: Secondary | ICD-10-CM

## 2013-11-22 DIAGNOSIS — F22 Delusional disorders: Secondary | ICD-10-CM | POA: Diagnosis not present
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
## 2013-11-24 DIAGNOSIS — F411 Generalized anxiety disorder: Secondary | ICD-10-CM | POA: Insufficient documentation

## 2013-11-24 DIAGNOSIS — F22 Delusional disorders: Secondary | ICD-10-CM | POA: Insufficient documentation

## 2013-11-29 NOTE — Progress Notes (Signed)
Reviewed case with dr crews-pt is not out pt candidate for her-poor diabetic, non wt bearing,non compliant sleep apnea, needs more support services he can get [facility]-

## 2013-12-04 ENCOUNTER — Encounter (HOSPITAL_COMMUNITY): Payer: Self-pay | Admitting: *Deleted

## 2013-12-04 ENCOUNTER — Other Ambulatory Visit: Payer: Self-pay | Admitting: Physician Assistant

## 2013-12-04 NOTE — Progress Notes (Signed)
Pt SDW-pre-op assessment completed by pt nurse Lafonda Mossesiana. Please assess pt for cardiac studies DOS. [facility].

## 2013-12-05 ENCOUNTER — Inpatient Hospital Stay (HOSPITAL_COMMUNITY): Payer: Medicaid Other | Admitting: Certified Registered"

## 2013-12-05 ENCOUNTER — Encounter (HOSPITAL_BASED_OUTPATIENT_CLINIC_OR_DEPARTMENT_OTHER): Admission: RE | Payer: Self-pay | Source: Ambulatory Visit

## 2013-12-05 ENCOUNTER — Ambulatory Visit (HOSPITAL_BASED_OUTPATIENT_CLINIC_OR_DEPARTMENT_OTHER): Admission: RE | Admit: 2013-12-05 | Payer: Medicaid Other | Source: Ambulatory Visit | Admitting: Orthopedic Surgery

## 2013-12-05 ENCOUNTER — Encounter (HOSPITAL_COMMUNITY)
Admission: RE | Disposition: A | Discharge status: Skilled Nursing Facility | Payer: Self-pay | Source: Ambulatory Visit | Attending: Orthopedic Surgery

## 2013-12-05 ENCOUNTER — Encounter (HOSPITAL_COMMUNITY): Payer: Medicaid Other | Admitting: Certified Registered"

## 2013-12-05 ENCOUNTER — Observation Stay (HOSPITAL_COMMUNITY)
Admission: RE | Admit: 2013-12-05 | Discharge: 2013-12-06 | Disposition: A | Discharge status: Skilled Nursing Facility | Payer: Medicaid Other | Source: Ambulatory Visit | Attending: Orthopedic Surgery | Admitting: Orthopedic Surgery

## 2013-12-05 ENCOUNTER — Encounter (HOSPITAL_COMMUNITY): Payer: Self-pay

## 2013-12-05 DIAGNOSIS — Z794 Long term (current) use of insulin: Secondary | ICD-10-CM | POA: Diagnosis not present

## 2013-12-05 DIAGNOSIS — Z89511 Acquired absence of right leg below knee: Secondary | ICD-10-CM | POA: Diagnosis not present

## 2013-12-05 DIAGNOSIS — G4733 Obstructive sleep apnea (adult) (pediatric): Secondary | ICD-10-CM | POA: Diagnosis not present

## 2013-12-05 DIAGNOSIS — M868X7 Other osteomyelitis, ankle and foot: Secondary | ICD-10-CM | POA: Diagnosis not present

## 2013-12-05 DIAGNOSIS — Z7952 Long term (current) use of systemic steroids: Secondary | ICD-10-CM | POA: Insufficient documentation

## 2013-12-05 DIAGNOSIS — I10 Essential (primary) hypertension: Secondary | ICD-10-CM | POA: Insufficient documentation

## 2013-12-05 DIAGNOSIS — Z7982 Long term (current) use of aspirin: Secondary | ICD-10-CM | POA: Insufficient documentation

## 2013-12-05 DIAGNOSIS — Z79891 Long term (current) use of opiate analgesic: Secondary | ICD-10-CM | POA: Insufficient documentation

## 2013-12-05 DIAGNOSIS — E11621 Type 2 diabetes mellitus with foot ulcer: Secondary | ICD-10-CM | POA: Diagnosis not present

## 2013-12-05 DIAGNOSIS — Z79899 Other long term (current) drug therapy: Secondary | ICD-10-CM | POA: Diagnosis not present

## 2013-12-05 DIAGNOSIS — K219 Gastro-esophageal reflux disease without esophagitis: Secondary | ICD-10-CM | POA: Insufficient documentation

## 2013-12-05 DIAGNOSIS — L97529 Non-pressure chronic ulcer of other part of left foot with unspecified severity: Secondary | ICD-10-CM | POA: Insufficient documentation

## 2013-12-05 DIAGNOSIS — M6702 Short Achilles tendon (acquired), left ankle: Secondary | ICD-10-CM | POA: Insufficient documentation

## 2013-12-05 DIAGNOSIS — M869 Osteomyelitis, unspecified: Secondary | ICD-10-CM | POA: Diagnosis present

## 2013-12-05 HISTORY — DX: Bipolar disorder, unspecified: F31.9

## 2013-12-05 HISTORY — DX: Obstructive sleep apnea (adult) (pediatric): G47.33

## 2013-12-05 HISTORY — DX: Personal history of other diseases of the digestive system: Z87.19

## 2013-12-05 HISTORY — DX: Dependence on other enabling machines and devices: Z99.89

## 2013-12-05 HISTORY — DX: Type 2 diabetes mellitus without complications: E11.9

## 2013-12-05 HISTORY — PX: AMPUTATION: SHX166

## 2013-12-05 HISTORY — DX: Unspecified chronic bronchitis: J42

## 2013-12-05 HISTORY — DX: Personal history of peptic ulcer disease: Z87.11

## 2013-12-05 HISTORY — PX: FOOT AMPUTATION THROUGH ANKLE: SHX643

## 2013-12-05 HISTORY — DX: Type 2 diabetes mellitus with diabetic polyneuropathy: E11.42

## 2013-12-05 HISTORY — DX: Migraine, unspecified, not intractable, without status migrainosus: G43.909

## 2013-12-05 HISTORY — DX: Personal history of other diseases of the musculoskeletal system and connective tissue: Z87.39

## 2013-12-05 HISTORY — DX: Other chronic pain: G89.29

## 2013-12-05 HISTORY — DX: Osteomyelitis, unspecified: M86.9

## 2013-12-05 HISTORY — DX: Chronic obstructive pulmonary disease, unspecified: J44.9

## 2013-12-05 HISTORY — DX: Dorsalgia, unspecified: M54.9

## 2013-12-05 HISTORY — DX: Unspecified osteoarthritis, unspecified site: M19.90

## 2013-12-05 HISTORY — DX: Essential (primary) hypertension: I10

## 2013-12-05 LAB — CBC
HCT: 35.3 % — ABNORMAL LOW (ref 39.0–52.0)
Hemoglobin: 12 g/dL — ABNORMAL LOW (ref 13.0–17.0)
MCH: 26.9 pg (ref 26.0–34.0)
MCHC: 34 g/dL (ref 30.0–36.0)
MCV: 79.1 fL (ref 78.0–100.0)
Platelets: 182 10*3/uL (ref 150–400)
RBC: 4.46 MIL/uL (ref 4.22–5.81)
RDW: 15.9 % — ABNORMAL HIGH (ref 11.5–15.5)
WBC: 6.9 10*3/uL (ref 4.0–10.5)

## 2013-12-05 LAB — BASIC METABOLIC PANEL
Anion gap: 11 (ref 5–15)
BUN: 12 mg/dL (ref 6–23)
CHLORIDE: 101 meq/L (ref 96–112)
CO2: 29 mEq/L (ref 19–32)
Calcium: 8.5 mg/dL (ref 8.4–10.5)
Creatinine, Ser: 0.85 mg/dL (ref 0.50–1.35)
GFR calc Af Amer: >90 mL/min (ref >90)
GFR calc non Af Amer: >90 mL/min (ref >90)
GLUCOSE: 107 mg/dL — AB (ref 70–99)
POTASSIUM: 3.5 meq/L — AB (ref 3.7–5.3)
SODIUM: 141 meq/L (ref 137–147)

## 2013-12-05 LAB — GLUCOSE, CAPILLARY
GLUCOSE-CAPILLARY: 127 mg/dL — AB (ref 70–99)
GLUCOSE-CAPILLARY: 106 mg/dL — AB (ref 70–99)
Glucose-Capillary: 115 mg/dL — ABNORMAL HIGH (ref 70–99)
Glucose-Capillary: 107 mg/dL — ABNORMAL HIGH (ref 70–99)
Glucose-Capillary: 149 mg/dL — ABNORMAL HIGH (ref 70–99)

## 2013-12-05 SURGERY — AMPUTATION, FOOT, RAY
Anesthesia: General | Laterality: Left

## 2013-12-05 SURGERY — AMPUTATION, FOOT, RAY
Anesthesia: General | Site: Leg Lower | Laterality: Left

## 2013-12-05 MED ORDER — INSULIN ASPART 100 UNIT/ML ~~LOC~~ SOLN: 5.0000 [IU] | Freq: Three times a day (TID) | SUBCUTANEOUS | Status: DC

## 2013-12-05 MED ORDER — PROPOFOL 10 MG/ML IV BOLUS
INTRAVENOUS | Status: AC
  Filled 2013-12-05: qty 20

## 2013-12-05 MED ORDER — PROPOFOL 10 MG/ML IV BOLUS
INTRAVENOUS | Status: DC | PRN
  Administered 2013-12-05: 200 mg via INTRAVENOUS

## 2013-12-05 MED ORDER — PRO-STAT SUGAR FREE PO LIQD
30.0000 mL | Freq: Every day | ORAL | Status: DC
  Administered 2013-12-06: 30 mL via ORAL
  Filled 2013-12-05 (×2): qty 30

## 2013-12-05 MED ORDER — FLUOXETINE HCL 20 MG PO CAPS
60.0000 mg | ORAL_CAPSULE | Freq: Every day | ORAL | Status: DC
  Administered 2013-12-06: 60 mg via ORAL
  Filled 2013-12-05: qty 3

## 2013-12-05 MED ORDER — LACTATED RINGERS IV SOLN
INTRAVENOUS | Status: DC
  Administered 2013-12-05: 10:00:00 via INTRAVENOUS

## 2013-12-05 MED ORDER — ENOXAPARIN SODIUM 40 MG/0.4ML ~~LOC~~ SOLN
40.0000 mg | SUBCUTANEOUS | Status: DC
  Administered 2013-12-06: 40 mg via SUBCUTANEOUS
  Filled 2013-12-05 (×2): qty 0.4

## 2013-12-05 MED ORDER — LACTATED RINGERS IV SOLN
INTRAVENOUS | Status: DC | PRN
  Administered 2013-12-05 (×2): via INTRAVENOUS

## 2013-12-05 MED ORDER — EPHEDRINE SULFATE 50 MG/ML IJ SOLN
INTRAMUSCULAR | Status: DC | PRN
  Administered 2013-12-05: 10 mg via INTRAVENOUS
  Administered 2013-12-05: 20 mg via INTRAVENOUS

## 2013-12-05 MED ORDER — ACETAMINOPHEN 500 MG PO TABS
1000.0000 mg | ORAL_TABLET | Freq: Once | ORAL | Status: AC
  Administered 2013-12-05: 1000 mg via ORAL

## 2013-12-05 MED ORDER — OXYCODONE HCL 5 MG PO TABS: 5.0000 mg | ORAL_TABLET | Freq: Once | ORAL | Status: DC | PRN

## 2013-12-05 MED ORDER — METOCLOPRAMIDE HCL 10 MG PO TABS: 5.0000 mg | ORAL_TABLET | Freq: Three times a day (TID) | ORAL | Status: DC | PRN

## 2013-12-05 MED ORDER — INSULIN GLARGINE 100 UNIT/ML ~~LOC~~ SOLN
35.0000 [IU] | Freq: Every day | SUBCUTANEOUS | Status: DC
  Administered 2013-12-05: 35 [IU] via SUBCUTANEOUS
  Filled 2013-12-05 (×2): qty 0.35

## 2013-12-05 MED ORDER — SODIUM CHLORIDE 0.9 % IV SOLN: INTRAVENOUS | Status: DC

## 2013-12-05 MED ORDER — ONDANSETRON HCL 4 MG PO TABS: 8.0000 mg | ORAL_TABLET | Freq: Four times a day (QID) | ORAL | Status: DC | PRN

## 2013-12-05 MED ORDER — DOCUSATE SODIUM 100 MG PO CAPS
100.0000 mg | ORAL_CAPSULE | Freq: Two times a day (BID) | ORAL | Status: DC
  Administered 2013-12-05 – 2013-12-06 (×2): 100 mg via ORAL
  Filled 2013-12-05 (×3): qty 1

## 2013-12-05 MED ORDER — MIDAZOLAM HCL 5 MG/5ML IJ SOLN
INTRAMUSCULAR | Status: DC | PRN
  Administered 2013-12-05: 2 mg via INTRAVENOUS

## 2013-12-05 MED ORDER — METOCLOPRAMIDE HCL 5 MG/ML IJ SOLN: 5.0000 mg | Freq: Three times a day (TID) | INTRAMUSCULAR | Status: DC | PRN

## 2013-12-05 MED ORDER — PREDNISONE 10 MG PO TABS
10.0000 mg | ORAL_TABLET | Freq: Every day | ORAL | Status: DC
  Administered 2013-12-06: 10 mg via ORAL
  Filled 2013-12-05 (×2): qty 1

## 2013-12-05 MED ORDER — CLONAZEPAM 1 MG PO TABS: 1.0000 mg | ORAL_TABLET | Freq: Two times a day (BID) | ORAL | Status: DC | PRN

## 2013-12-05 MED ORDER — FENTANYL CITRATE 0.05 MG/ML IJ SOLN
INTRAMUSCULAR | Status: DC | PRN
  Administered 2013-12-05 (×2): 50 ug via INTRAVENOUS

## 2013-12-05 MED ORDER — FLUDROCORTISONE ACETATE 0.1 MG PO TABS
0.2000 mg | ORAL_TABLET | Freq: Every day | ORAL | Status: DC
  Administered 2013-12-06: 0.2 mg via ORAL
  Filled 2013-12-05: qty 2

## 2013-12-05 MED ORDER — CLONAZEPAM 0.5 MG PO TABS
0.5000 mg | ORAL_TABLET | Freq: Three times a day (TID) | ORAL | Status: DC
  Administered 2013-12-05 – 2013-12-06 (×2): 0.5 mg via ORAL
  Filled 2013-12-05 (×3): qty 1

## 2013-12-05 MED ORDER — CHLORHEXIDINE GLUCONATE 4 % EX LIQD
60.0000 mL | Freq: Once | CUTANEOUS | Status: DC
  Filled 2013-12-05: qty 60

## 2013-12-05 MED ORDER — OXYCODONE HCL 5 MG PO TABS
5.0000 mg | ORAL_TABLET | ORAL | Status: DC | PRN
  Administered 2013-12-05 – 2013-12-06 (×2): 10 mg via ORAL
  Filled 2013-12-05 (×2): qty 2

## 2013-12-05 MED ORDER — SENNA 8.6 MG PO TABS
2.0000 | ORAL_TABLET | Freq: Two times a day (BID) | ORAL | Status: DC
  Administered 2013-12-05 – 2013-12-06 (×2): 17.2 mg via ORAL
  Filled 2013-12-05 (×3): qty 2

## 2013-12-05 MED ORDER — GLYCOPYRROLATE 0.2 MG/ML IJ SOLN
INTRAMUSCULAR | Status: DC | PRN
  Administered 2013-12-05: 0.2 mg via INTRAVENOUS

## 2013-12-05 MED ORDER — HYDROMORPHONE HCL 1 MG/ML IJ SOLN: 0.5000 mg | INTRAMUSCULAR | Status: DC | PRN

## 2013-12-05 MED ORDER — MEPERIDINE HCL 25 MG/ML IJ SOLN: 6.2500 mg | INTRAMUSCULAR | Status: DC | PRN

## 2013-12-05 MED ORDER — ONDANSETRON HCL 4 MG/2ML IJ SOLN
INTRAMUSCULAR | Status: DC | PRN
  Administered 2013-12-05: 4 mg via INTRAVENOUS

## 2013-12-05 MED ORDER — MELATONIN 3 MG PO CAPS: 3.0000 mg | ORAL_CAPSULE | Freq: Every day | ORAL | Status: DC

## 2013-12-05 MED ORDER — LIDOCAINE HCL (CARDIAC) 20 MG/ML IV SOLN
INTRAVENOUS | Status: DC | PRN
  Administered 2013-12-05: 100 mg via INTRAVENOUS

## 2013-12-05 MED ORDER — LISINOPRIL 5 MG PO TABS
5.0000 mg | ORAL_TABLET | Freq: Every day | ORAL | Status: DC
  Administered 2013-12-06: 5 mg via ORAL
  Filled 2013-12-05 (×2): qty 1

## 2013-12-05 MED ORDER — LORATADINE 10 MG PO TABS
10.0000 mg | ORAL_TABLET | Freq: Every day | ORAL | Status: DC
  Administered 2013-12-06: 10 mg via ORAL
  Filled 2013-12-05: qty 1

## 2013-12-05 MED ORDER — PREGABALIN 75 MG PO CAPS
75.0000 mg | ORAL_CAPSULE | Freq: Three times a day (TID) | ORAL | Status: DC
  Administered 2013-12-05 – 2013-12-06 (×2): 75 mg via ORAL
  Filled 2013-12-05 (×2): qty 1

## 2013-12-05 MED ORDER — MIDAZOLAM HCL 2 MG/2ML IJ SOLN
INTRAMUSCULAR | Status: AC
  Filled 2013-12-05: qty 2

## 2013-12-05 MED ORDER — OXYCODONE HCL 5 MG/5ML PO SOLN: 5.0000 mg | Freq: Once | ORAL | Status: DC | PRN

## 2013-12-05 MED ORDER — FLUTICASONE PROPIONATE 50 MCG/ACT NA SUSP
1.0000 | Freq: Every day | NASAL | Status: DC
  Administered 2013-12-06: 1 via NASAL
  Filled 2013-12-05: qty 16

## 2013-12-05 MED ORDER — GABAPENTIN 300 MG PO CAPS
300.0000 mg | ORAL_CAPSULE | Freq: Three times a day (TID) | ORAL | Status: DC | PRN
  Administered 2013-12-05: 300 mg via ORAL
  Filled 2013-12-05: qty 1

## 2013-12-05 MED ORDER — LURASIDONE HCL 80 MG PO TABS
80.0000 mg | ORAL_TABLET | Freq: Every day | ORAL | Status: DC
  Administered 2013-12-05: 80 mg via ORAL
  Filled 2013-12-05 (×2): qty 1

## 2013-12-05 MED ORDER — PANTOPRAZOLE SODIUM 40 MG PO TBEC
40.0000 mg | DELAYED_RELEASE_TABLET | Freq: Every day | ORAL | Status: DC
  Administered 2013-12-06: 40 mg via ORAL
  Filled 2013-12-05 (×2): qty 1

## 2013-12-05 MED ORDER — LIDOCAINE HCL (CARDIAC) 20 MG/ML IV SOLN
INTRAVENOUS | Status: AC
  Filled 2013-12-05: qty 5

## 2013-12-05 MED ORDER — FENTANYL CITRATE 0.05 MG/ML IJ SOLN
INTRAMUSCULAR | Status: AC
  Filled 2013-12-05: qty 5

## 2013-12-05 MED ORDER — ONDANSETRON HCL 4 MG/2ML IJ SOLN: 4.0000 mg | Freq: Once | INTRAMUSCULAR | Status: DC | PRN

## 2013-12-05 MED ORDER — CEFAZOLIN SODIUM-DEXTROSE 2-3 GM-% IV SOLR
2.0000 g | INTRAVENOUS | Status: AC
  Administered 2013-12-05: 2 g via INTRAVENOUS

## 2013-12-05 MED ORDER — HYDROMORPHONE HCL 1 MG/ML IJ SOLN
0.2500 mg | INTRAMUSCULAR | Status: DC | PRN
  Administered 2013-12-05 (×2): 0.5 mg via INTRAVENOUS

## 2013-12-05 SURGICAL SUPPLY — 53 items
BLADE LONG MED 31MMX9MM (MISCELLANEOUS) ×1
BLADE LONG MED 31X9 (MISCELLANEOUS) ×2 IMPLANT
BNDG CMPR 9X4 STRL LF SNTH (GAUZE/BANDAGES/DRESSINGS) ×1
BNDG COHESIVE 4X5 TAN STRL (GAUZE/BANDAGES/DRESSINGS) ×3 IMPLANT
BNDG COHESIVE 6X5 TAN STRL LF (GAUZE/BANDAGES/DRESSINGS) ×3 IMPLANT
BNDG ESMARK 4X9 LF (GAUZE/BANDAGES/DRESSINGS) ×3 IMPLANT
CANISTER SUCT 3000ML (MISCELLANEOUS) ×1 IMPLANT
CHLORAPREP W/TINT 26ML (MISCELLANEOUS) ×3 IMPLANT
CUFF TOURNIQUET SINGLE 34IN LL (TOURNIQUET CUFF) IMPLANT
CUFF TOURNIQUET SINGLE 44IN (TOURNIQUET CUFF) IMPLANT
DRAPE U-SHAPE 47X51 STRL (DRAPES) ×6 IMPLANT
DRSG ADAPTIC 3X8 NADH LF (GAUZE/BANDAGES/DRESSINGS) ×3 IMPLANT
DRSG MEPITEL 4X7.2 (GAUZE/BANDAGES/DRESSINGS) ×2 IMPLANT
DRSG PAD ABDOMINAL 8X10 ST (GAUZE/BANDAGES/DRESSINGS) ×2 IMPLANT
ELECT REM PT RETURN 9FT ADLT (ELECTROSURGICAL) ×3
ELECTRODE REM PT RTRN 9FT ADLT (ELECTROSURGICAL) ×1 IMPLANT
GAUZE SPONGE 4X4 12PLY STRL (GAUZE/BANDAGES/DRESSINGS) ×3 IMPLANT
GLOVE BIO SURGEON STRL SZ7 (GLOVE) ×6 IMPLANT
GLOVE BIO SURGEON STRL SZ8 (GLOVE) ×6 IMPLANT
GLOVE BIOGEL PI IND STRL 7.5 (GLOVE) ×1 IMPLANT
GLOVE BIOGEL PI IND STRL 8 (GLOVE) ×1 IMPLANT
GLOVE BIOGEL PI INDICATOR 7.5 (GLOVE) ×2
GLOVE BIOGEL PI INDICATOR 8 (GLOVE) ×2
GOWN STRL REUS W/ TWL LRG LVL3 (GOWN DISPOSABLE) ×1 IMPLANT
GOWN STRL REUS W/ TWL XL LVL3 (GOWN DISPOSABLE) ×1 IMPLANT
GOWN STRL REUS W/TWL LRG LVL3 (GOWN DISPOSABLE) ×3
GOWN STRL REUS W/TWL XL LVL3 (GOWN DISPOSABLE) ×3
KIT BASIN OR (CUSTOM PROCEDURE TRAY) ×3 IMPLANT
KIT ROOM TURNOVER OR (KITS) ×3 IMPLANT
NS IRRIG 1000ML POUR BTL (IV SOLUTION) ×3 IMPLANT
PACK ORTHO EXTREMITY (CUSTOM PROCEDURE TRAY) ×3 IMPLANT
PAD ARMBOARD 7.5X6 YLW CONV (MISCELLANEOUS) ×6 IMPLANT
PAD CAST 4YDX4 CTTN HI CHSV (CAST SUPPLIES) ×1 IMPLANT
PADDING CAST ABS 4INX4YD NS (CAST SUPPLIES) ×2
PADDING CAST ABS COTTON 4X4 ST (CAST SUPPLIES) IMPLANT
PADDING CAST COTTON 4X4 STRL (CAST SUPPLIES) ×3
PADDING CAST COTTON 6X4 STRL (CAST SUPPLIES) ×2 IMPLANT
SPECIMEN JAR SMALL (MISCELLANEOUS) ×1 IMPLANT
SPLINT PLASTER CAST XFAST 5X30 (CAST SUPPLIES) IMPLANT
SPLINT PLASTER XFAST SET 5X30 (CAST SUPPLIES) ×20
SPONGE GAUZE 4X4 12PLY STER LF (GAUZE/BANDAGES/DRESSINGS) ×2 IMPLANT
SPONGE LAP 18X18 X RAY DECT (DISPOSABLE) ×3 IMPLANT
STAPLER VISISTAT 35W (STAPLE) IMPLANT
STOCKINETTE IMPERVIOUS LG (DRAPES) IMPLANT
SUCTION FRAZIER TIP 10 FR DISP (SUCTIONS) ×3 IMPLANT
SUT ETHILON 2 0 PSLX (SUTURE) ×4 IMPLANT
SUT PDS AB 0 CT 36 (SUTURE) ×4 IMPLANT
TOWEL OR 17X24 6PK STRL BLUE (TOWEL DISPOSABLE) ×3 IMPLANT
TOWEL OR 17X26 10 PK STRL BLUE (TOWEL DISPOSABLE) ×3 IMPLANT
TUBE CONNECTING 12'X1/4 (SUCTIONS) ×1
TUBE CONNECTING 12X1/4 (SUCTIONS) ×2 IMPLANT
UNDERPAD 30X30 INCONTINENT (UNDERPADS AND DIAPERS) ×3 IMPLANT
WATER STERILE IRR 1000ML POUR (IV SOLUTION) ×3 IMPLANT

## 2013-12-05 NOTE — Progress Notes (Signed)
Utilization review completed.  
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
## 2013-12-05 NOTE — Op Note (Signed)
NAME:  Feliberto HartsSTEWART, Matis             ACCOUNT NO.:  [id number]  MEDICAL RECORD NO.:  [id number]  LOCATION:  [facility]                         FACILITY:  [facility]  PHYSICIAN:  Toni ArthursJohn Daune Divirgilio, MD        DATE OF BIRTH:  22-Nov-1955  DATE OF PROCEDURE:  12/05/2013 DATE OF DISCHARGE:                              OPERATIVE REPORT   PREOPERATIVE DIAGNOSES: 1. Left foot chronic diabetic ulcer. 2. Tight left heel cord. 3. Left fifth metatarsal base osteomyelitis.  POSTOPERATIVE DIAGNOSES: 1. Left foot chronic diabetic ulcer. 2. Tight left heel cord. 3. Left fifth metatarsal base osteomyelitis.  PROCEDURE: 1. Left Achilles tenotomy. 2. [name]. 3. Transfer of left tibialis anterior tendon to the talar neck.  SURGEON:  Toni ArthursJohn Heydi Swango, MD.  ASSISTANT:  Lucretia KernJacquie Allen, PA-C  ANESTHESIA:  General.  ESTIMATED BLOOD LOSS:  Minimal.  TOURNIQUET TIME:  35 minutes at 250 mmHg.  COMPLICATIONS:  None apparent.  DISPOSITION:  Extubated, awake, and stable to recovery.  INDICATIONS FOR PROCEDURE:  The patient is a 58 year old male with past medical history significant for diabetes.  He is in the postoperative period currently for recent right below-knee amputation.  He has a chronic nonhealing diabetic foot ulcer at the lateral aspect of his hindfoot.  He recently had an MRI which shows fifth metatarsal base osteomyelitis.  He also has a tight heel cord.  He presents now for operative treatment of this condition.  He understands the risks and benefits, the alternative treatment options, and elects surgical treatment.  He specifically understands the risks of bleeding, infection, nerve damage, blood clots, need for additional surgery, continued pain, amputation, and death.  PROCEDURE IN DETAIL:  After preoperative consent was obtained and the correct operative site was identified, the patient was brought to the operating room and placed supine on the operating table.  General anesthesia  was induced.  Preoperative antibiotics were administered. Surgical time-out was taken.  Left lower extremity was prepped and draped in standard sterile fashion with a tourniquet around the thigh. The extremity was exsanguinated.  The tourniquet was inflated to 250 mmHg.  A plantar flap incision was marked on the skin at the level of the transverse tarsal joint taking care to completely excise the diabetic ulcer at the base of the metatarsal.  An incision was made. Sharp dissection was carried down through the skin and subcutaneous tissue to the level of the bone.  The tibialis anterior tendon was dissected free and released from its insertion on the medial cuneiform. Subperiosteal dissection was carried along the bone circumferentially. The transverse tarsal joint was identified and the forefoot and midfoot were disarticulated from the hindfoot through these 2 joints.  The foot was then passed off the field as a specimen to Pathology.  The named vascular structures were all cauterized.  A drill bit was used to drill a hole in the talar neck.  This was expanded with a curette.  A 0 PDS whipstitch was placed in the stump of the tibialis anterior tendon.  Prior to performing the amputation of the forefoot, a percutaneous tenotomy was carried out at the Achilles.  This allowed appropriate dorsiflexion of the ankle.  The tibialis anterior  tendon was pulled down into the hole of the talar neck and secured by sewing it to the spring ligament plantarly.  The wound was then irrigated copiously.  The incision was closed with horizontal mattress in simple sutures of 2-0 nylon.  Sterile dressings were applied followed by a well-padded short-leg splint.  Tourniquet was released after application of the dressings at 35 minutes.  The patient was then awakened from anesthesia and transported to the recovery room in stable condition.  FOLLOWUP PLAN:  The patient will be nonweightbearing on the left  lower extremity.  He will follow up with me in 2 weeks for suture removal.  We likely will discharge him back to his nursing home tomorrow morning.  Lucretia KernJacquie Allen, PA-C was present, scrubbed for the duration of the case. Her assistance was essential in positioning, prepping and draping, performing the operation, closing and dressing the wounds, and applying the splint.  These operations were performed for a separate diagnosis in the postoperative period for the patient's previous right below-knee amputation.     Toni ArthursJohn Takisha Pelle, MD     JH/MEDQ  D:  12/05/2013  T:  12/05/2013  Job:  [id number]

## 2013-12-05 NOTE — Progress Notes (Signed)
Barcode system downtime, two patient identifiers confirmed, correct medication, correct dose, correct route, correct time, and correct reason verified by Rupert StacksAngela Newman, Phd, RN, [facility] clinical professor.   Meds administered: Pantiprazole sodium 40 mg PO, Clonazepam 0.5mg  PO, Lisinopril 5 mg PO BP was 159/66, Prostat PO

## 2013-12-05 NOTE — Evaluation (Signed)
Physical Therapy Evaluation Patient Details Name: [name] MRN: [id number] DOB: 06-26-1955 Today's Date: 12/05/2013   History of Present Illness  58 y.o. male s/p left chopart amputation with tibialis anterior tendon graft, and achilles tenotomy. History of diabetes, diabetic neuropathy, Addison's disease on chronic prednisone, PTSD, schizophrenia, anxiety, history of traumatic brain injury. Pt s/p Right BKA 09/11/13.  Clinical Impression  Patient seen following the above procedure and presents with functional limitations due to the deficits listed below (see PT Problem List). He is interested in our CIR program as he reports he is no longer receiving physical therapy at [facility] and has "graduated" until he can be fitted for a prosthesis for Rt BKA. Additionally patient lives alone but is motivated to improve his independence and very hopeful to ambulate in the future. Have requested CIR screen to determine if they can progress him to a Mod I level with a wheelchair so that he can return to his apartment. Regardless of location, pt will certainly need continued physical therapy to advance his functional independence.       Follow Up Recommendations CIR;Other (comment) (If CIR declines pt will need SNF placement)    Equipment Recommendations   (TBD based on d/c location)    Recommendations for Other Services Rehab consult     Precautions / Restrictions Precautions Precautions: Fall Restrictions Weight Bearing Restrictions: Yes LLE Weight Bearing: Non weight bearing Other Position/Activity Restrictions: No prosthetic for RLE yet.      Mobility  Bed Mobility Overal bed mobility: Needs Assistance Bed Mobility: Supine to Sit     Supine to sit: Supervision     General bed mobility comments: Supervision for safety with VC for technique. Good  UE strength and balance to assume long sitting position.  Transfers Overall transfer level: Needs assistance Equipment used:  None Transfers: Licensed conveyancerAnterior-Posterior Transfer       Anterior-Posterior transfers: Min guard   General transfer comment: Close min guard for safety. Good UE strength and sitting balance during transfer. VC for technique into chair. Maintains NWB on LLE throughout transfer.  Ambulation/Gait                Stairs            Wheelchair Mobility    Modified Rankin (Stroke Patients Only)       Balance Overall balance assessment: Needs assistance Sitting-balance support: No upper extremity supported;Feet unsupported Sitting balance-Leahy Scale: Fair                                       Pertinent Vitals/Pain Pain Assessment: 0-10 Pain Score: 5  Pain Location: Lt foot Pain Descriptors / Indicators: Constant Pain Intervention(s): Monitored during session;Repositioned;RN gave pain meds during session    Home Living Family/patient expects to be discharged to:: Unsure Living Arrangements: Alone Available Help at Discharge: Other (Comment) (No family in area) Type of Home: Apartment Home Access: Level entry     Home Layout: One level Home Equipment: Walker - 2 wheels Additional Comments: Pt reports he has an apartment with a level entry. Would like to be independent again and eventually walk with prosthetics.    Prior Function Level of Independence: Needs assistance   Gait / Transfers Assistance Needed: Has not ambulated since Rt BKA     Comments: Pt reports he has progressed and "graduated from PT" at [facility]  but cannot wear a prosthetic  yet.     Hand Dominance        Extremity/Trunk Assessment   Upper Extremity Assessment: Defer to OT evaluation           Lower Extremity Assessment: RLE deficits/detail;LLE deficits/detail RLE Deficits / Details: Previous BKA with shrinker in place, Good strength and ROM LLE Deficits / Details: Difficult to assess due to bandaging. Good knee flexion/extension strength and ROM      Communication   Communication: No difficulties  Cognition Arousal/Alertness: Awake/alert Behavior During Therapy: Flat affect Overall Cognitive Status: Within Functional Limits for tasks assessed                      General Comments General comments (skin integrity, edema, etc.): SpO2 96% on 2L supplemental O2 - down to 93-94% on room air duing transfer.    Exercises General Exercises - Lower Extremity Quad Sets: Strengthening;Left;10 reps;Seated Gluteal Sets: Strengthening;Both;10 reps;Seated      Assessment/Plan    PT Assessment Patient needs continued PT services  PT Diagnosis Difficulty walking;Acute pain   PT Problem List Decreased strength;Decreased range of motion;Decreased activity tolerance;Decreased balance;Decreased mobility;Decreased knowledge of use of DME;Pain  PT Treatment Interventions DME instruction;Functional mobility training;Therapeutic activities;Therapeutic exercise;Balance training;Neuromuscular re-education;Patient/family education;Modalities;Wheelchair mobility training   PT Goals (Current goals can be found in the Care Plan section) Acute Rehab PT Goals Patient Stated Goal: walk again PT Goal Formulation: With patient Time For Goal Achievement: 12/12/13 Potential to Achieve Goals: Good    Frequency Min 5X/week   Barriers to discharge Decreased caregiver support Lives alone    Co-evaluation               End of Session Equipment Utilized During Treatment: Gait belt Activity Tolerance: Patient tolerated treatment well Patient left: in chair;with call bell/phone within reach;with nursing/sitter in room Nurse Communication: Mobility status;Other (comment) (A-P transfer)         Time: [id number] PT Time Calculation (min): 19 min   Charges:   PT Evaluation $Initial PT Evaluation Tier I: 1 Procedure PT Treatments $Therapeutic Activity: 8-22 mins   PT G Codes:         [name], [place] CarolinaPT [phone]  [name], [name]  S 12/05/2013, 6:27 PM

## 2013-12-05 NOTE — Progress Notes (Signed)
PHARMACIST - PHYSICIAN ORDER COMMUNICATION  CONCERNING: P&T Medication Policy on Herbal Medications  DESCRIPTION:  This patient's order for:  Melatonin  has been noted.  This product(s) is classified as an "herbal" or natural product. Due to a lack of definitive safety studies or FDA approval, nonstandard manufacturing practices, plus the potential risk of unknown drug-drug interactions while on inpatient medications, the Pharmacy and Therapeutics Committee does not permit the use of "herbal" or natural products of this type within [facility].   ACTION TAKEN: The pharmacy department is unable to verify this order at this time and your patient has been informed of this safety policy. Please reevaluate patient's clinical condition at discharge and address if the herbal or natural product(s) should be resumed at that time.  Thank you,  Harland Germanndrew Haylyn Halberg, Pharm D 12/05/2013 3:14 PM

## 2013-12-05 NOTE — Anesthesia Preprocedure Evaluation (Signed)
Anesthesia Evaluation  Patient identified by MRN, date of birth, ID band Patient awake    Reviewed: Allergy & Precautions, H&P , NPO status , Patient's Chart, lab work & pertinent test results  Airway Mallampati: I TM Distance: >3 FB Neck ROM: Full    Dental   Pulmonary sleep apnea ,          Cardiovascular hypertension, Pt. on medications     Neuro/Psych    GI/Hepatic GERD-  Medicated and Controlled,  Endo/Other  diabetes, Well Controlled, Type 2, Insulin Dependent  Renal/GU      Musculoskeletal   Abdominal   Peds  Hematology   Anesthesia Other Findings   Reproductive/Obstetrics                           Anesthesia Physical Anesthesia Plan  ASA: III  Anesthesia Plan: General   Post-op Pain Management:    Induction: Intravenous  Airway Management Planned: LMA  Additional Equipment:   Intra-op Plan:   Post-operative Plan: Extubation in OR  Informed Consent: I have reviewed the patients History and Physical, chart, labs and discussed the procedure including the risks, benefits and alternatives for the proposed anesthesia with the patient or authorized representative who has indicated his/her understanding and acceptance.     Plan Discussed with: CRNA and Surgeon  Anesthesia Plan Comments:         Anesthesia Quick Evaluation

## 2013-12-05 NOTE — Brief Op Note (Signed)
12/05/2013  1:12 PM  PATIENT:  [name]  58 y.o. male  PRE-OPERATIVE DIAGNOSIS: 1.  Left foot chronic diabetic ulcer      2.  Tight left heelcord      3.  Left 5th metatarsal base osteomyelitis  POST-OPERATIVE DIAGNOSIS: same  Procedure(s): 1.  Left achilles tenotomy 2.  [name] 3.  Transfer of the left tibialis anterior tendon to the talar neck  SURGEON:  Toni ArthursJohn Brigitte Soderberg, MD  ASSISTANT: Lucretia KernJacquie Allen, PA-C  ANESTHESIA:   General  EBL:  minimal   TOURNIQUET:   Total Tourniquet Time Documented: Thigh (Left) - 35 minutes Total: Thigh (Left) - 35 minutes   COMPLICATIONS:  None apparent  DISPOSITION:  Extubated, awake and stable to recovery.  DICTATION ID:  [id number]

## 2013-12-05 NOTE — Transfer of Care (Signed)
Immediate Anesthesia Transfer of Care Note  Patient: [name]  Procedure(s) Performed: Procedure(s): LEFT CHAPERT AMPUTATION, LEFT TIBIAL ANTERIOR TENDON TRANSFER (Left)  Patient Location: PACU  Anesthesia Type:General  Level of Consciousness: awake  Airway & Oxygen Therapy: Patient Spontanous Breathing and Patient connected to nasal cannula oxygen  Post-op Assessment: Report given to PACU RN and Post -op Vital signs reviewed and stable  Post vital signs: Reviewed and stable  Complications: No apparent anesthesia complications

## 2013-12-05 NOTE — Anesthesia Postprocedure Evaluation (Signed)
Anesthesia Post Note  Patient: [name]  Procedure(s) Performed: Procedure(s) (LRB): LEFT CHAPERT AMPUTATION, LEFT TIBIAL ANTERIOR TENDON TRANSFER (Left)  Anesthesia type: general  Patient location: PACU  Post pain: Pain level controlled  Post assessment: Patient's Cardiovascular Status Stable  Last Vitals:  Filed Vitals:   12/05/13 1439  BP:   Pulse:   Temp: 36.4 C  Resp:     Post vital signs: Reviewed and stable  Level of consciousness: sedated  Complications: No apparent anesthesia complications

## 2013-12-06 ENCOUNTER — Encounter (HOSPITAL_COMMUNITY): Payer: Self-pay | Admitting: Orthopedic Surgery

## 2013-12-06 DIAGNOSIS — M868X7 Other osteomyelitis, ankle and foot: Secondary | ICD-10-CM | POA: Diagnosis not present

## 2013-12-06 LAB — GLUCOSE, CAPILLARY
GLUCOSE-CAPILLARY: 141 mg/dL — AB (ref 70–99)
Glucose-Capillary: 153 mg/dL — ABNORMAL HIGH (ref 70–99)

## 2013-12-06 MED ORDER — OXYCODONE HCL 5 MG PO TABS: 5.0000 mg | ORAL_TABLET | ORAL | Status: DC | PRN

## 2013-12-06 NOTE — Clinical Social Work Note (Signed)
[facility]. Pt updated at bedside.  [facility]: [phone] [address]: [address])  Marcelline Deistmily Elon Lomeli, [place] [phone]) Licensed Clinical Social Worker Orthopedics [phone]([id number]) and Surgical [phone]([id number])

## 2013-12-06 NOTE — Clinical Social Work Psychosocial (Signed)
Clinical Social Work Department BRIEF PSYCHOSOCIAL ASSESSMENT 12/06/2013  Patient:  [name],[name]     Account Number:  [id number]     Admit date:  12/05/2013  Clinical Social Worker:  Read DriversINGLE,Raequan Vanschaick, LCSWA  Date/Time:  12/06/2013 12:14 PM  Referred by:  Physician  Date Referred:  12/06/2013 Referred for  SNF Placement  Psychosocial assessment   Other Referral:   NONE   Interview type:  Patient Other interview type:   NONE    PSYCHOSOCIAL DATA Living Status:  FACILITY Admitted from facility:  [facility] [place] Level of care:  Skilled Nursing Facility Primary support name:  Hawkins LionsLois Primary support relationship to patient:  PARENT Degree of support available:   fair    CURRENT CONCERNS Current Concerns  Post-Acute Placement   Other Concerns:    SOCIAL WORK ASSESSMENT / PLAN CSW assessed pt.  [facility]  Per MD report pt is ready for discharge this morning.  Pt is agreeable to return back to SNF.  CSW contacted SNF to confirm these plans. Pt will need transportation back to SNF.  CSW will coordinate.  Pt remains hopeful regarding prognosis.   Assessment/plan status:  Psychosocial Support/Ongoing Assessment of Needs Other assessment/ plan:   FL2-updated  PASARR should be existing   Information/referral to community resources:   SNF/STR    PATIENT'S/FAMILY'S RESPONSE TO PLAN OF CARE: Pt is agreeable [facility].  Pt was appreciative of CSW assistance and support.       Vickii PennaGina Micheala Morissette, LCSWA [phone]  Psychiatric & Orthopedics (5N 1-16) Clinical Social Worker

## 2013-12-06 NOTE — Discharge Instructions (Signed)
John Hewitt, MD °[facility] ° °Please read the following information regarding your care after surgery. ° °Medications  °You only need a prescription for the narcotic pain medicine (ex. oxycodone, Percocet, Norco).  All of the other medicines listed below are available over the counter. °X acetominophen (Tylenol) 650 mg every 4-6 hours as you need for minor pain °X oxycodone as prescribed for moderate to severe pain °?  ° °Narcotic pain medicine (ex. oxycodone, Percocet, Vicodin) will cause constipation.  To prevent this problem, take the following medicines while you are taking any pain medicine. °X docusate sodium (Colace) 100 mg twice a day X senna (Senokot) 2 tablets twice a day ° °X To help prevent blood clots, take an aspirin (325 mg) once a day for a month after surgery.  You should also get up every hour while you are awake to move around.   ° °Weight Bearing °X Do not bear any weight on the operated leg or foot. ° °Cast / Splint / Dressing °X Keep your splint or cast clean and dry.  Don’t put anything (coat hanger, pencil, etc) down inside of it.  If it gets damp, use a hair dryer on the cool setting to dry it.  If it gets soaked, call the office to schedule an appointment for a cast change. °  ° °After your dressing, cast or splint is removed; you may shower, but do not soak or scrub the wound.  Allow the water to run over it, and then gently pat it dry. ° °Swelling °It is normal for you to have swelling where you had surgery.  To reduce swelling and pain, keep your toes above your nose for at least 3 days after surgery.  It may be necessary to keep your foot or leg elevated for several weeks.  If it hurts, it should be elevated. ° °Follow Up °Call my office at [phone] when you are discharged from the hospital or surgery center to schedule an appointment to be seen two weeks after surgery. ° °Call my office at [phone] if you develop a fever >101.5° F, nausea, vomiting, bleeding from  the surgical site or severe pain.   ° ° °

## 2013-12-06 NOTE — Plan of Care (Signed)
Problem: Phase II Progression Outcomes Goal: Ambulate with moderate assist Outcome: Not Met (add Reason) nwb bilat legs  Problem: Discharge Progression Outcomes Goal: Ambulate with minimal assist Outcome: Not Met (add Reason) nwb bilat legs

## 2013-12-06 NOTE — Discharge Summary (Signed)
Physician Discharge Summary  Patient ID: [name] MRN: [id number] DOB/AGE: 56-Sep-1957 56 y.o.  Admit date: 12/05/2013 Discharge date: 12/06/2013  Admission Diagnoses: L foot osteomyelitis, Diabetes Mellitus Type II, S/P R BKA  Discharge Diagnoses:  Active Problems:   Foot osteomyelitis, left   Discharged Condition: good  Hospital Course: [name] from his rehab facility, for operative treatment of his L foot osteomyelitis. He underwent a L Chopart amputation with anterior tibial tendon transfer. He tolerated the procedure and anesthesia well and had no immediate post-op complications. He was transferred from PACU to the floor with no concerns.   Today, 10/23, he states that he is feeling well and better than yesterday. He reports that his pain is well controlled with the pain medications. His appetite has returned and he has no new concerns. He denies any new HA, CP, SOB, N, V, fever, chills, calf swelling or pain. He is ready to return to his rehab facility.  Consults: None  Significant Diagnostic Studies: N/A  Treatments: surgery: L Chopart amputation and anterior tibial tendon transfer  Discharge Exam: Blood pressure 136/64, pulse 58, temperature 98.2 F (36.8 C), temperature source Oral, resp. rate 17, weight 92.987 kg (205 lb), SpO2 97.00%. Physical exam: WD/WN Caucasian male in nad. A and O x4. Mood and affect appropriate. EOMI. Respirations normal and unlabored. Abdomen soft and non-tender. Prior BKA on RLE. LLE in hard splint; RLE in stump shrinker sock. Dressings C/D/I. Cannot assess distal pulses or sensation due to dressings; proximal pulses at 2+. No lymphadenopathy. No calf swelling or palpable cords.   Disposition: 01-Home or Self Care  Discharge Instructions   Call MD / Call 911    Complete by:  As directed   If you experience chest pain or shortness of breath, CALL 911 and be transported to the hospital emergency room.  If you  develope a fever above 101 F, pus (white drainage) or increased drainage or redness at the wound, or calf pain, call your surgeon's office.     Constipation Prevention    Complete by:  As directed   Drink plenty of fluids.  Prune juice may be helpful.  You may use a stool softener, such as Colace (over the counter) 100 mg twice a day.  Use MiraLax (over the counter) for constipation as needed.     Diet - low sodium heart healthy    Complete by:  As directed      Increase activity slowly as tolerated    Complete by:  As directed      Non weight bearing    Complete by:  As directed   Laterality:  left  Extremity:  Lower            Medication List         aspirin EC 81 MG tablet  Take 81 mg by mouth daily.     aspirin-acetaminophen-caffeine 250-250-65 MG per tablet  Commonly known as:  EXCEDRIN MIGRAINE  Take 1 tablet by mouth every 6 (six) hours as needed. Migraine     clonazePAM 0.5 MG tablet  Commonly known as:  KLONOPIN  Take 0.5 mg by mouth 3 (three) times daily.     clonazePAM 1 MG tablet  Commonly known as:  KLONOPIN  Take 1 mg by mouth 2 (two) times daily as needed for anxiety.     DECUBI-VITE PO  Take 1 tablet by mouth 2 (two) times daily.     feeding supplement (PRO-STAT  SUGAR FREE 64) Liqd  Take 30 mLs by mouth daily.     fludrocortisone 0.1 MG tablet  Commonly known as:  FLORINEF  Take 0.2 mg by mouth daily.     FLUoxetine 20 MG capsule  Commonly known as:  PROZAC  Take 60 mg by mouth daily.     fluticasone 50 MCG/ACT nasal spray  Commonly known as:  FLONASE  Place 1 spray into both nostrils daily. For allergies     gabapentin 300 MG capsule  Commonly known as:  NEURONTIN  Take 300 mg by mouth every 8 (eight) hours as needed (neuropathy).     insulin aspart 100 UNIT/ML injection  Commonly known as:  novoLOG  Inject 5-10 Units into the skin 3 (three) times daily before meals.     insulin glargine 100 UNIT/ML injection  Commonly known as:  LANTUS   Inject 35 Units into the skin at bedtime.     lisinopril 5 MG tablet  Commonly known as:  PRINIVIL,ZESTRIL  Take 5 mg by mouth daily.     loratadine 10 MG tablet  Commonly known as:  CLARITIN  Take 10 mg by mouth daily.     lurasidone 40 MG Tabs tablet  Commonly known as:  LATUDA  Take 80 mg by mouth at bedtime.     Melatonin 3 MG Caps  Take 3 mg by mouth daily.     ondansetron 4 MG tablet  Commonly known as:  ZOFRAN  Take 8 mg by mouth every 6 (six) hours as needed. For nausea     oxyCODONE 5 MG immediate release tablet  Commonly known as:  Oxy IR/ROXICODONE  Take 1-2 tablets (5-10 mg total) by mouth every 4 (four) hours as needed for moderate pain or breakthrough pain.     pantoprazole 40 MG tablet  Commonly known as:  PROTONIX  Take 40 mg by mouth daily.     predniSONE 10 MG tablet  Commonly known as:  DELTASONE  Take 10 mg by mouth daily with breakfast.     pregabalin 75 MG capsule  Commonly known as:  LYRICA  Take 1 capsule (75 mg total) by mouth 3 (three) times daily.     senna 8.6 MG Tabs tablet  Commonly known as:  SENOKOT  Take 2 tablets (17.2 mg total) by mouth 2 (two) times daily.           Follow-up Information   Follow up with Toni ArthursHEWITT, JOHN, MD In 2 weeks. (For wound re-check)    Specialty:  Orthopedic Surgery   Contact information:   [address] [place] [id number] [phone]     NWB LLE in hard splint PO pain medications for pain control ASA 325mg  once daily for DVT prophylaxis F/U in clinic in 2 weeks for wound check  Signed: Deanta Mincey HOWELLS 12/06/2013, 8:16 AM [phone]

## 2013-12-06 NOTE — Progress Notes (Signed)
Rehab Admissions Coordinator Note:  Patient was screened by Trish MageLogue, Kionte Baumgardner M for appropriateness for an Inpatient Acute Rehab Consult.  At this time, we are recommending Skilled Nursing Facility.  [facility] then it is best for him to get his therapies there.  If [facility] not [facility], then could consider inpatient rehab consult.  Call me for questions.  Lelon FrohlichLogue, Lancer Thurner M 12/06/2013, 9:08 AM  I can be reached at [phone].

## 2013-12-10 ENCOUNTER — Emergency Department (HOSPITAL_COMMUNITY)
Admission: EM | Admit: 2013-12-10 | Discharge: 2013-12-10 | Disposition: A | Discharge status: Home / Self Care | Payer: Medicaid Other | Attending: Emergency Medicine | Admitting: Emergency Medicine

## 2013-12-10 ENCOUNTER — Non-Acute Institutional Stay (SKILLED_NURSING_FACILITY): Payer: Medicaid Other | Admitting: Internal Medicine

## 2013-12-10 ENCOUNTER — Encounter: Payer: Self-pay | Admitting: Internal Medicine

## 2013-12-10 ENCOUNTER — Encounter (HOSPITAL_COMMUNITY): Payer: Self-pay | Admitting: Emergency Medicine

## 2013-12-10 ENCOUNTER — Emergency Department (HOSPITAL_COMMUNITY): Payer: Medicaid Other

## 2013-12-10 DIAGNOSIS — F32A Depression, unspecified: Secondary | ICD-10-CM

## 2013-12-10 DIAGNOSIS — Z8739 Personal history of other diseases of the musculoskeletal system and connective tissue: Secondary | ICD-10-CM | POA: Diagnosis not present

## 2013-12-10 DIAGNOSIS — M79672 Pain in left foot: Secondary | ICD-10-CM | POA: Insufficient documentation

## 2013-12-10 DIAGNOSIS — T8131XA Disruption of external operation (surgical) wound, not elsewhere classified, initial encounter: Secondary | ICD-10-CM | POA: Diagnosis present

## 2013-12-10 DIAGNOSIS — J449 Chronic obstructive pulmonary disease, unspecified: Secondary | ICD-10-CM | POA: Diagnosis not present

## 2013-12-10 DIAGNOSIS — Z7951 Long term (current) use of inhaled steroids: Secondary | ICD-10-CM | POA: Diagnosis not present

## 2013-12-10 DIAGNOSIS — E11621 Type 2 diabetes mellitus with foot ulcer: Secondary | ICD-10-CM | POA: Insufficient documentation

## 2013-12-10 DIAGNOSIS — Z89432 Acquired absence of left foot: Secondary | ICD-10-CM

## 2013-12-10 DIAGNOSIS — G4733 Obstructive sleep apnea (adult) (pediatric): Secondary | ICD-10-CM | POA: Insufficient documentation

## 2013-12-10 DIAGNOSIS — R52 Pain, unspecified: Secondary | ICD-10-CM

## 2013-12-10 DIAGNOSIS — F319 Bipolar disorder, unspecified: Secondary | ICD-10-CM | POA: Diagnosis not present

## 2013-12-10 DIAGNOSIS — E1140 Type 2 diabetes mellitus with diabetic neuropathy, unspecified: Secondary | ICD-10-CM

## 2013-12-10 DIAGNOSIS — Z87891 Personal history of nicotine dependence: Secondary | ICD-10-CM | POA: Insufficient documentation

## 2013-12-10 DIAGNOSIS — I10 Essential (primary) hypertension: Secondary | ICD-10-CM | POA: Diagnosis not present

## 2013-12-10 DIAGNOSIS — F329 Major depressive disorder, single episode, unspecified: Secondary | ICD-10-CM

## 2013-12-10 DIAGNOSIS — Z794 Long term (current) use of insulin: Secondary | ICD-10-CM | POA: Insufficient documentation

## 2013-12-10 DIAGNOSIS — L97509 Non-pressure chronic ulcer of other part of unspecified foot with unspecified severity: Principal | ICD-10-CM

## 2013-12-10 DIAGNOSIS — Z87820 Personal history of traumatic brain injury: Secondary | ICD-10-CM | POA: Insufficient documentation

## 2013-12-10 DIAGNOSIS — M1990 Unspecified osteoarthritis, unspecified site: Secondary | ICD-10-CM | POA: Insufficient documentation

## 2013-12-10 DIAGNOSIS — E271 Primary adrenocortical insufficiency: Secondary | ICD-10-CM

## 2013-12-10 DIAGNOSIS — R531 Weakness: Secondary | ICD-10-CM | POA: Diagnosis not present

## 2013-12-10 DIAGNOSIS — G43909 Migraine, unspecified, not intractable, without status migrainosus: Secondary | ICD-10-CM | POA: Insufficient documentation

## 2013-12-10 DIAGNOSIS — Z7952 Long term (current) use of systemic steroids: Secondary | ICD-10-CM | POA: Insufficient documentation

## 2013-12-10 DIAGNOSIS — E1169 Type 2 diabetes mellitus with other specified complication: Principal | ICD-10-CM

## 2013-12-10 DIAGNOSIS — Z79899 Other long term (current) drug therapy: Secondary | ICD-10-CM | POA: Diagnosis not present

## 2013-12-10 DIAGNOSIS — Z7982 Long term (current) use of aspirin: Secondary | ICD-10-CM | POA: Insufficient documentation

## 2013-12-10 DIAGNOSIS — F4310 Post-traumatic stress disorder, unspecified: Secondary | ICD-10-CM

## 2013-12-10 DIAGNOSIS — F200 Paranoid schizophrenia: Secondary | ICD-10-CM | POA: Diagnosis not present

## 2013-12-10 DIAGNOSIS — T889XXA Complication of surgical and medical care, unspecified, initial encounter: Secondary | ICD-10-CM | POA: Diagnosis not present

## 2013-12-10 DIAGNOSIS — M869 Osteomyelitis, unspecified: Principal | ICD-10-CM

## 2013-12-10 DIAGNOSIS — Z89439 Acquired absence of unspecified foot: Secondary | ICD-10-CM | POA: Insufficient documentation

## 2013-12-10 NOTE — ED Notes (Signed)
Secretary asked to call [facility] for transport.

## 2013-12-10 NOTE — ED Notes (Signed)
OR called to obtain Adaptic dressing for wound. Ortho tech paged.
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
## 2013-12-10 NOTE — Discharge Instructions (Signed)
You were seen today for a wound check. Your dressing was replaced and splint replaced. No evidence of new fracture or injury. Mild oozing was noted from your wound site.  You should follow-up with Dr. Victorino DikeHewitt as scheduled.
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
## 2013-12-10 NOTE — ED Notes (Signed)
[facility] Pt s/p left foot amputation on the 22nd. Pt with hx of Tourette's syndrome and kicked the bed rail while he was sleeping, causing his wound to bleed. Nursing changed the pt's dressing around 2230, but the wound bled through the new dressing, and pt wanted to be evaluated. Pt sees Dr. Victorino DikeHewitt. Pt received 2 oxycodone at 0320.

## 2013-12-24 ENCOUNTER — Other Ambulatory Visit: Payer: Self-pay | Admitting: *Deleted

## 2013-12-24 MED ORDER — CLONAZEPAM 0.5 MG PO TABS: ORAL_TABLET | ORAL | Status: DC

## 2013-12-24 NOTE — Telephone Encounter (Signed)
[facility] 

## 2013-12-31 ENCOUNTER — Other Ambulatory Visit: Payer: Self-pay | Admitting: *Deleted

## 2013-12-31 MED ORDER — OXYCODONE HCL 5 MG PO TABS: ORAL_TABLET | ORAL | Status: DC

## 2013-12-31 NOTE — Telephone Encounter (Signed)
[facility] 

## 2014-01-28 ENCOUNTER — Other Ambulatory Visit: Payer: Self-pay | Admitting: *Deleted

## 2014-01-28 MED ORDER — OXYCODONE HCL 5 MG PO TABS: ORAL_TABLET | ORAL | Status: DC

## 2014-01-28 NOTE — Telephone Encounter (Signed)
[facility] 

## 2014-02-03 ENCOUNTER — Non-Acute Institutional Stay (SKILLED_NURSING_FACILITY): Payer: Medicaid Other | Admitting: Adult Health

## 2014-02-03 DIAGNOSIS — E1143 Type 2 diabetes mellitus with diabetic autonomic (poly)neuropathy: Secondary | ICD-10-CM

## 2014-02-03 DIAGNOSIS — E1142 Type 2 diabetes mellitus with diabetic polyneuropathy: Secondary | ICD-10-CM

## 2014-02-03 DIAGNOSIS — I10 Essential (primary) hypertension: Secondary | ICD-10-CM

## 2014-02-03 DIAGNOSIS — F200 Paranoid schizophrenia: Secondary | ICD-10-CM

## 2014-02-03 DIAGNOSIS — J309 Allergic rhinitis, unspecified: Secondary | ICD-10-CM

## 2014-02-03 DIAGNOSIS — G990 Autonomic neuropathy in diseases classified elsewhere: Secondary | ICD-10-CM

## 2014-02-03 DIAGNOSIS — K219 Gastro-esophageal reflux disease without esophagitis: Secondary | ICD-10-CM

## 2014-02-03 DIAGNOSIS — K5900 Constipation, unspecified: Secondary | ICD-10-CM

## 2014-02-03 DIAGNOSIS — G629 Polyneuropathy, unspecified: Secondary | ICD-10-CM

## 2014-02-03 DIAGNOSIS — E271 Primary adrenocortical insufficiency: Secondary | ICD-10-CM

## 2014-02-03 DIAGNOSIS — E1342 Other specified diabetes mellitus with diabetic polyneuropathy: Secondary | ICD-10-CM

## 2014-02-14 ENCOUNTER — Encounter: Payer: Self-pay | Admitting: Adult Health

## 2014-02-14 DIAGNOSIS — E1143 Type 2 diabetes mellitus with diabetic autonomic (poly)neuropathy: Secondary | ICD-10-CM | POA: Insufficient documentation

## 2014-02-14 DIAGNOSIS — J309 Allergic rhinitis, unspecified: Secondary | ICD-10-CM | POA: Insufficient documentation

## 2014-02-14 DIAGNOSIS — K5900 Constipation, unspecified: Secondary | ICD-10-CM | POA: Insufficient documentation

## 2014-02-14 DIAGNOSIS — K219 Gastro-esophageal reflux disease without esophagitis: Secondary | ICD-10-CM | POA: Insufficient documentation

## 2014-02-14 DIAGNOSIS — E1142 Type 2 diabetes mellitus with diabetic polyneuropathy: Secondary | ICD-10-CM | POA: Insufficient documentation

## 2014-02-14 MED ORDER — LISINOPRIL 5 MG PO TABS: 2.5000 mg | ORAL_TABLET | Freq: Every day | ORAL | Status: DC
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
## 2014-03-12 ENCOUNTER — Other Ambulatory Visit: Payer: Self-pay | Admitting: *Deleted

## 2014-03-12 MED ORDER — PREGABALIN 75 MG PO CAPS: 75.0000 mg | ORAL_CAPSULE | Freq: Three times a day (TID) | ORAL | Status: DC

## 2014-03-12 NOTE — Telephone Encounter (Signed)
[facility] 

## 2014-03-13 ENCOUNTER — Non-Acute Institutional Stay (SKILLED_NURSING_FACILITY): Payer: Medicaid Other | Admitting: Adult Health

## 2014-03-13 ENCOUNTER — Encounter: Payer: Self-pay | Admitting: Adult Health

## 2014-03-13 DIAGNOSIS — G629 Polyneuropathy, unspecified: Secondary | ICD-10-CM

## 2014-03-13 DIAGNOSIS — E1140 Type 2 diabetes mellitus with diabetic neuropathy, unspecified: Secondary | ICD-10-CM

## 2014-03-13 DIAGNOSIS — E271 Primary adrenocortical insufficiency: Secondary | ICD-10-CM

## 2014-03-13 DIAGNOSIS — I10 Essential (primary) hypertension: Secondary | ICD-10-CM

## 2014-03-13 DIAGNOSIS — Z89512 Acquired absence of left leg below knee: Secondary | ICD-10-CM

## 2014-03-13 DIAGNOSIS — K219 Gastro-esophageal reflux disease without esophagitis: Secondary | ICD-10-CM

## 2014-03-13 DIAGNOSIS — E1142 Type 2 diabetes mellitus with diabetic polyneuropathy: Secondary | ICD-10-CM

## 2014-03-13 DIAGNOSIS — E1342 Other specified diabetes mellitus with diabetic polyneuropathy: Secondary | ICD-10-CM

## 2014-03-13 DIAGNOSIS — Z89431 Acquired absence of right foot: Secondary | ICD-10-CM

## 2014-03-13 DIAGNOSIS — F200 Paranoid schizophrenia: Secondary | ICD-10-CM

## 2014-03-14 ENCOUNTER — Other Ambulatory Visit: Payer: Self-pay | Admitting: Internal Medicine

## 2014-03-14 LAB — HEMOGLOBIN A1C
HEMOGLOBIN A1C: 7.8 % — AB (ref <5.7)
Mean Plasma Glucose: 177 mg/dL — ABNORMAL HIGH (ref <117)

## 2014-03-26 ENCOUNTER — Other Ambulatory Visit: Payer: Self-pay | Admitting: *Deleted

## 2014-03-26 MED ORDER — OXYCODONE HCL 5 MG PO TABS: ORAL_TABLET | ORAL | Status: DC

## 2014-03-26 NOTE — Telephone Encounter (Signed)
[id number] 

## 2014-03-27 ENCOUNTER — Other Ambulatory Visit: Payer: Self-pay | Admitting: Internal Medicine

## 2014-03-27 LAB — BASIC METABOLIC PANEL
BUN: 9 mg/dL (ref 6–23)
CALCIUM: 8.5 mg/dL (ref 8.4–10.5)
CO2: 29 meq/L (ref 19–32)
Chloride: 100 mEq/L (ref 96–112)
Creat: 0.92 mg/dL (ref 0.50–1.35)
GLUCOSE: 144 mg/dL — AB (ref 70–99)
Potassium: 3.7 mEq/L (ref 3.5–5.3)
Sodium: 138 mEq/L (ref 135–145)

## 2014-04-05 DIAGNOSIS — Z89512 Acquired absence of left leg below knee: Secondary | ICD-10-CM | POA: Insufficient documentation
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
## 2014-04-14 ENCOUNTER — Non-Acute Institutional Stay (SKILLED_NURSING_FACILITY): Payer: Medicaid Other | Admitting: Adult Health

## 2014-04-14 ENCOUNTER — Encounter: Payer: Self-pay | Admitting: Adult Health

## 2014-04-14 DIAGNOSIS — F200 Paranoid schizophrenia: Secondary | ICD-10-CM

## 2014-04-14 DIAGNOSIS — E1143 Type 2 diabetes mellitus with diabetic autonomic (poly)neuropathy: Secondary | ICD-10-CM

## 2014-04-14 DIAGNOSIS — E1342 Other specified diabetes mellitus with diabetic polyneuropathy: Secondary | ICD-10-CM

## 2014-04-14 DIAGNOSIS — E271 Primary adrenocortical insufficiency: Secondary | ICD-10-CM

## 2014-04-14 DIAGNOSIS — I10 Essential (primary) hypertension: Secondary | ICD-10-CM

## 2014-04-14 DIAGNOSIS — G629 Polyneuropathy, unspecified: Secondary | ICD-10-CM

## 2014-04-14 DIAGNOSIS — K5900 Constipation, unspecified: Secondary | ICD-10-CM

## 2014-04-14 DIAGNOSIS — K219 Gastro-esophageal reflux disease without esophagitis: Secondary | ICD-10-CM

## 2014-04-14 DIAGNOSIS — J309 Allergic rhinitis, unspecified: Secondary | ICD-10-CM

## 2014-04-14 DIAGNOSIS — E1142 Type 2 diabetes mellitus with diabetic polyneuropathy: Secondary | ICD-10-CM

## 2014-04-14 DIAGNOSIS — G990 Autonomic neuropathy in diseases classified elsewhere: Secondary | ICD-10-CM

## 2014-04-14 MED ORDER — CANAGLIFLOZIN-METFORMIN HCL 50-500 MG PO TABS: 1.0000 | ORAL_TABLET | Freq: Two times a day (BID) | ORAL | Status: DC

## 2014-04-14 MED ORDER — LINAGLIPTIN 5 MG PO TABS: 5.0000 mg | ORAL_TABLET | Freq: Every day | ORAL | Status: DC

## 2014-04-14 MED ORDER — LISINOPRIL 5 MG PO TABS: 5.0000 mg | ORAL_TABLET | Freq: Every day | ORAL | Status: DC

## 2014-04-14 MED ORDER — LURASIDONE HCL 120 MG PO TABS: 120.0000 mg | ORAL_TABLET | Freq: Every day | ORAL | Status: DC
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
## 2014-04-25 ENCOUNTER — Non-Acute Institutional Stay (SKILLED_NURSING_FACILITY): Payer: Medicaid Other | Admitting: Adult Health

## 2014-04-25 DIAGNOSIS — E08621 Diabetes mellitus due to underlying condition with foot ulcer: Secondary | ICD-10-CM

## 2014-04-25 DIAGNOSIS — E1140 Type 2 diabetes mellitus with diabetic neuropathy, unspecified: Secondary | ICD-10-CM | POA: Diagnosis not present

## 2014-04-25 DIAGNOSIS — L97529 Non-pressure chronic ulcer of other part of left foot with unspecified severity: Secondary | ICD-10-CM | POA: Diagnosis not present

## 2014-05-22 ENCOUNTER — Non-Acute Institutional Stay (SKILLED_NURSING_FACILITY): Payer: Medicaid Other | Admitting: Adult Health

## 2014-05-22 DIAGNOSIS — E1143 Type 2 diabetes mellitus with diabetic autonomic (poly)neuropathy: Secondary | ICD-10-CM

## 2014-05-22 DIAGNOSIS — K219 Gastro-esophageal reflux disease without esophagitis: Secondary | ICD-10-CM | POA: Diagnosis not present

## 2014-05-22 DIAGNOSIS — F200 Paranoid schizophrenia: Secondary | ICD-10-CM | POA: Diagnosis not present

## 2014-05-22 DIAGNOSIS — K5900 Constipation, unspecified: Secondary | ICD-10-CM

## 2014-05-22 DIAGNOSIS — I10 Essential (primary) hypertension: Secondary | ICD-10-CM

## 2014-05-22 DIAGNOSIS — J309 Allergic rhinitis, unspecified: Secondary | ICD-10-CM | POA: Diagnosis not present

## 2014-05-22 DIAGNOSIS — E1142 Type 2 diabetes mellitus with diabetic polyneuropathy: Secondary | ICD-10-CM

## 2014-05-22 DIAGNOSIS — E271 Primary adrenocortical insufficiency: Secondary | ICD-10-CM | POA: Diagnosis not present

## 2014-05-22 DIAGNOSIS — G990 Autonomic neuropathy in diseases classified elsewhere: Secondary | ICD-10-CM

## 2014-06-06 ENCOUNTER — Encounter (HOSPITAL_COMMUNITY)
Admission: RE | Admit: 2014-06-06 | Discharge: 2014-06-06 | Disposition: A | Discharge status: Home / Self Care | Payer: Medicaid Other | Source: Ambulatory Visit | Attending: Orthopedic Surgery | Admitting: Orthopedic Surgery

## 2014-06-06 ENCOUNTER — Encounter (HOSPITAL_COMMUNITY): Payer: Self-pay

## 2014-06-06 DIAGNOSIS — Z01818 Encounter for other preprocedural examination: Secondary | ICD-10-CM | POA: Diagnosis not present

## 2014-06-06 HISTORY — DX: Reserved for inherently not codable concepts without codable children: IMO0001

## 2014-06-06 LAB — CBC
HCT: 39 % (ref 39.0–52.0)
Hemoglobin: 13.1 g/dL (ref 13.0–17.0)
MCH: 26.6 pg (ref 26.0–34.0)
MCHC: 33.6 g/dL (ref 30.0–36.0)
MCV: 79.3 fL (ref 78.0–100.0)
PLATELETS: 185 10*3/uL (ref 150–400)
RBC: 4.92 MIL/uL (ref 4.22–5.81)
RDW: 16.3 % — ABNORMAL HIGH (ref 11.5–15.5)
WBC: 8.9 10*3/uL (ref 4.0–10.5)

## 2014-06-06 LAB — BASIC METABOLIC PANEL
Anion gap: 10 (ref 5–15)
BUN: 8 mg/dL (ref 6–23)
CALCIUM: 9.3 mg/dL (ref 8.4–10.5)
CO2: 28 mmol/L (ref 19–32)
CREATININE: 1.01 mg/dL (ref 0.50–1.35)
Chloride: 100 mmol/L (ref 96–112)
GFR calc non Af Amer: 81 mL/min — ABNORMAL LOW (ref >90)
GLUCOSE: 212 mg/dL — AB (ref 70–99)
Potassium: 3.8 mmol/L (ref 3.5–5.1)
SODIUM: 138 mmol/L (ref 135–145)

## 2014-06-12 ENCOUNTER — Encounter (HOSPITAL_COMMUNITY): Payer: Self-pay | Admitting: Orthopedic Surgery

## 2014-06-12 ENCOUNTER — Encounter (HOSPITAL_COMMUNITY)
Admission: RE | Disposition: A | Discharge status: Home Health | Payer: Self-pay | Source: Ambulatory Visit | Attending: Orthopedic Surgery

## 2014-06-12 ENCOUNTER — Inpatient Hospital Stay (HOSPITAL_COMMUNITY): Payer: Medicaid Other | Admitting: Anesthesiology

## 2014-06-12 ENCOUNTER — Inpatient Hospital Stay (HOSPITAL_COMMUNITY)
Admission: RE | Admit: 2014-06-12 | Discharge: 2014-06-13 | DRG: 617 | Disposition: A | Discharge status: Home Health | Payer: Medicaid Other | Source: Ambulatory Visit | Attending: Orthopedic Surgery | Admitting: Orthopedic Surgery

## 2014-06-12 DIAGNOSIS — F419 Anxiety disorder, unspecified: Secondary | ICD-10-CM | POA: Diagnosis present

## 2014-06-12 DIAGNOSIS — M1990 Unspecified osteoarthritis, unspecified site: Secondary | ICD-10-CM | POA: Diagnosis present

## 2014-06-12 DIAGNOSIS — G4733 Obstructive sleep apnea (adult) (pediatric): Secondary | ICD-10-CM | POA: Diagnosis present

## 2014-06-12 DIAGNOSIS — E08621 Diabetes mellitus due to underlying condition with foot ulcer: Secondary | ICD-10-CM | POA: Diagnosis present

## 2014-06-12 DIAGNOSIS — Z87891 Personal history of nicotine dependence: Secondary | ICD-10-CM

## 2014-06-12 DIAGNOSIS — F209 Schizophrenia, unspecified: Secondary | ICD-10-CM | POA: Diagnosis present

## 2014-06-12 DIAGNOSIS — E11621 Type 2 diabetes mellitus with foot ulcer: Principal | ICD-10-CM | POA: Diagnosis present

## 2014-06-12 DIAGNOSIS — Z7952 Long term (current) use of systemic steroids: Secondary | ICD-10-CM

## 2014-06-12 DIAGNOSIS — G8929 Other chronic pain: Secondary | ICD-10-CM | POA: Diagnosis present

## 2014-06-12 DIAGNOSIS — L97529 Non-pressure chronic ulcer of other part of left foot with unspecified severity: Secondary | ICD-10-CM | POA: Diagnosis present

## 2014-06-12 DIAGNOSIS — M25372 Other instability, left ankle: Secondary | ICD-10-CM | POA: Diagnosis present

## 2014-06-12 DIAGNOSIS — H409 Unspecified glaucoma: Secondary | ICD-10-CM | POA: Diagnosis present

## 2014-06-12 DIAGNOSIS — Z833 Family history of diabetes mellitus: Secondary | ICD-10-CM

## 2014-06-12 DIAGNOSIS — L97509 Non-pressure chronic ulcer of other part of unspecified foot with unspecified severity: Secondary | ICD-10-CM

## 2014-06-12 DIAGNOSIS — E271 Primary adrenocortical insufficiency: Secondary | ICD-10-CM | POA: Diagnosis present

## 2014-06-12 DIAGNOSIS — Z888 Allergy status to other drugs, medicaments and biological substances status: Secondary | ICD-10-CM

## 2014-06-12 DIAGNOSIS — Z79899 Other long term (current) drug therapy: Secondary | ICD-10-CM | POA: Diagnosis not present

## 2014-06-12 DIAGNOSIS — K219 Gastro-esophageal reflux disease without esophagitis: Secondary | ICD-10-CM | POA: Diagnosis present

## 2014-06-12 DIAGNOSIS — F319 Bipolar disorder, unspecified: Secondary | ICD-10-CM | POA: Diagnosis present

## 2014-06-12 DIAGNOSIS — Z89442 Acquired absence of left ankle: Secondary | ICD-10-CM | POA: Diagnosis not present

## 2014-06-12 DIAGNOSIS — Z89511 Acquired absence of right leg below knee: Secondary | ICD-10-CM

## 2014-06-12 DIAGNOSIS — J449 Chronic obstructive pulmonary disease, unspecified: Secondary | ICD-10-CM | POA: Diagnosis present

## 2014-06-12 DIAGNOSIS — F329 Major depressive disorder, single episode, unspecified: Secondary | ICD-10-CM | POA: Diagnosis present

## 2014-06-12 DIAGNOSIS — G43909 Migraine, unspecified, not intractable, without status migrainosus: Secondary | ICD-10-CM | POA: Diagnosis present

## 2014-06-12 DIAGNOSIS — E1142 Type 2 diabetes mellitus with diabetic polyneuropathy: Secondary | ICD-10-CM | POA: Diagnosis present

## 2014-06-12 HISTORY — PX: AMPUTATION: SHX166

## 2014-06-12 HISTORY — PX: BELOW KNEE LEG AMPUTATION: SUR23

## 2014-06-12 LAB — CBC
HEMATOCRIT: 38.9 % — AB (ref 39.0–52.0)
HEMOGLOBIN: 13.3 g/dL (ref 13.0–17.0)
MCH: 27 pg (ref 26.0–34.0)
MCHC: 34.2 g/dL (ref 30.0–36.0)
MCV: 79.1 fL (ref 78.0–100.0)
Platelets: 206 10*3/uL (ref 150–400)
RBC: 4.92 MIL/uL (ref 4.22–5.81)
RDW: 16.1 % — ABNORMAL HIGH (ref 11.5–15.5)
WBC: 10.7 10*3/uL — ABNORMAL HIGH (ref 4.0–10.5)

## 2014-06-12 LAB — GLUCOSE, CAPILLARY
GLUCOSE-CAPILLARY: 194 mg/dL — AB (ref 70–99)
Glucose-Capillary: 195 mg/dL — ABNORMAL HIGH (ref 70–99)
Glucose-Capillary: 198 mg/dL — ABNORMAL HIGH (ref 70–99)
Glucose-Capillary: 130 mg/dL — ABNORMAL HIGH (ref 70–99)

## 2014-06-12 LAB — CREATININE, SERUM
Creatinine, Ser: 0.99 mg/dL (ref 0.50–1.35)
GFR calc Af Amer: >90 mL/min (ref >90)
GFR calc non Af Amer: 90 mL/min — ABNORMAL LOW (ref >90)

## 2014-06-12 SURGERY — AMPUTATION BELOW KNEE
Anesthesia: General | Site: Leg Lower | Laterality: Left

## 2014-06-12 MED ORDER — GLYCOPYRROLATE 0.2 MG/ML IJ SOLN
INTRAMUSCULAR | Status: DC | PRN
  Administered 2014-06-12: 0.2 mg via INTRAVENOUS

## 2014-06-12 MED ORDER — BUPIVACAINE LIPOSOME 1.3 % IJ SUSP
20.0000 mL | Freq: Once | INTRAMUSCULAR | Status: DC
  Filled 2014-06-12: qty 20

## 2014-06-12 MED ORDER — ROCURONIUM BROMIDE 50 MG/5ML IV SOLN
INTRAVENOUS | Status: AC
  Filled 2014-06-12: qty 1

## 2014-06-12 MED ORDER — 0.9 % SODIUM CHLORIDE (POUR BTL) OPTIME
TOPICAL | Status: DC | PRN
  Administered 2014-06-12: 1000 mL

## 2014-06-12 MED ORDER — CEFAZOLIN SODIUM-DEXTROSE 2-3 GM-% IV SOLR
2.0000 g | Freq: Four times a day (QID) | INTRAVENOUS | Status: AC
  Administered 2014-06-12 – 2014-06-13 (×3): 2 g via INTRAVENOUS
  Filled 2014-06-12 (×3): qty 50

## 2014-06-12 MED ORDER — OXYCODONE HCL 5 MG PO TABS
5.0000 mg | ORAL_TABLET | ORAL | Status: DC | PRN
  Administered 2014-06-12 – 2014-06-13 (×3): 10 mg via ORAL
  Filled 2014-06-12 (×3): qty 2

## 2014-06-12 MED ORDER — SODIUM CHLORIDE 0.9 % IV SOLN
INTRAVENOUS | Status: DC | PRN
  Administered 2014-06-12: 40 mL

## 2014-06-12 MED ORDER — OXYCODONE HCL 5 MG PO TABS
ORAL_TABLET | ORAL | Status: AC
  Filled 2014-06-12: qty 1

## 2014-06-12 MED ORDER — CANAGLIFLOZIN 100 MG PO TABS
150.0000 mg | ORAL_TABLET | Freq: Two times a day (BID) | ORAL | Status: DC
  Administered 2014-06-13: 150 mg via ORAL
  Filled 2014-06-12 (×4): qty 1.5

## 2014-06-12 MED ORDER — HYDROMORPHONE HCL 1 MG/ML IJ SOLN
INTRAMUSCULAR | Status: AC
  Filled 2014-06-12: qty 1

## 2014-06-12 MED ORDER — FLUTICASONE PROPIONATE 50 MCG/ACT NA SUSP
1.0000 | Freq: Every day | NASAL | Status: DC
  Administered 2014-06-12 – 2014-06-13 (×2): 1 via NASAL
  Filled 2014-06-12: qty 16

## 2014-06-12 MED ORDER — HYDROMORPHONE HCL 1 MG/ML IJ SOLN
0.2500 mg | INTRAMUSCULAR | Status: DC | PRN
  Administered 2014-06-12: 0.5 mg via INTRAVENOUS

## 2014-06-12 MED ORDER — MIDAZOLAM HCL 5 MG/5ML IJ SOLN
INTRAMUSCULAR | Status: DC | PRN
  Administered 2014-06-12: 2 mg via INTRAVENOUS

## 2014-06-12 MED ORDER — LURASIDONE HCL 20 MG PO TABS: 20.0000 mg | ORAL_TABLET | Freq: Every day | ORAL | Status: DC

## 2014-06-12 MED ORDER — FLUOXETINE HCL 20 MG PO CAPS
60.0000 mg | ORAL_CAPSULE | Freq: Every day | ORAL | Status: DC
  Administered 2014-06-13: 60 mg via ORAL
  Filled 2014-06-12: qty 3

## 2014-06-12 MED ORDER — PROPOFOL 10 MG/ML IV BOLUS
INTRAVENOUS | Status: AC
  Filled 2014-06-12: qty 20

## 2014-06-12 MED ORDER — ONDANSETRON HCL 4 MG/2ML IJ SOLN
INTRAMUSCULAR | Status: DC | PRN
  Administered 2014-06-12: 4 mg via INTRAVENOUS

## 2014-06-12 MED ORDER — METFORMIN HCL 500 MG PO TABS
500.0000 mg | ORAL_TABLET | Freq: Two times a day (BID) | ORAL | Status: DC
  Administered 2014-06-12 – 2014-06-13 (×2): 500 mg via ORAL
  Filled 2014-06-12 (×2): qty 1

## 2014-06-12 MED ORDER — ENOXAPARIN SODIUM 40 MG/0.4ML ~~LOC~~ SOLN
40.0000 mg | SUBCUTANEOUS | Status: DC
  Administered 2014-06-13: 40 mg via SUBCUTANEOUS
  Filled 2014-06-12: qty 0.4

## 2014-06-12 MED ORDER — LISINOPRIL 5 MG PO TABS
5.0000 mg | ORAL_TABLET | Freq: Every day | ORAL | Status: DC
  Administered 2014-06-13: 5 mg via ORAL
  Filled 2014-06-12: qty 1

## 2014-06-12 MED ORDER — OXYCODONE HCL 5 MG PO TABS
5.0000 mg | ORAL_TABLET | Freq: Once | ORAL | Status: AC | PRN
  Administered 2014-06-12: 5 mg via ORAL

## 2014-06-12 MED ORDER — ARTIFICIAL TEARS OP OINT
TOPICAL_OINTMENT | OPHTHALMIC | Status: AC
  Filled 2014-06-12: qty 3.5

## 2014-06-12 MED ORDER — LACTATED RINGERS IV SOLN
INTRAVENOUS | Status: DC | PRN
  Administered 2014-06-12: 07:00:00 via INTRAVENOUS

## 2014-06-12 MED ORDER — LURASIDONE HCL 40 MG PO TABS
100.0000 mg | ORAL_TABLET | Freq: Every day | ORAL | Status: DC
  Administered 2014-06-12: 100 mg via ORAL
  Filled 2014-06-12 (×2): qty 3

## 2014-06-12 MED ORDER — SODIUM CHLORIDE 0.9 % IV SOLN: INTRAVENOUS | Status: DC

## 2014-06-12 MED ORDER — PREGABALIN 75 MG PO CAPS
75.0000 mg | ORAL_CAPSULE | Freq: Three times a day (TID) | ORAL | Status: DC
  Administered 2014-06-12 – 2014-06-13 (×3): 75 mg via ORAL
  Filled 2014-06-12 (×3): qty 1

## 2014-06-12 MED ORDER — PROPOFOL 10 MG/ML IV BOLUS
INTRAVENOUS | Status: DC | PRN
  Administered 2014-06-12: 200 mg via INTRAVENOUS

## 2014-06-12 MED ORDER — CLONAZEPAM 0.5 MG PO TABS: 0.7500 mg | ORAL_TABLET | Freq: Three times a day (TID) | ORAL | Status: DC | PRN

## 2014-06-12 MED ORDER — SUCCINYLCHOLINE CHLORIDE 20 MG/ML IJ SOLN
INTRAMUSCULAR | Status: AC
  Filled 2014-06-12: qty 1

## 2014-06-12 MED ORDER — CEFAZOLIN SODIUM-DEXTROSE 2-3 GM-% IV SOLR
2.0000 g | INTRAVENOUS | Status: AC
  Administered 2014-06-12: 2 g via INTRAVENOUS

## 2014-06-12 MED ORDER — INSULIN ASPART 100 UNIT/ML ~~LOC~~ SOLN
0.0000 [IU] | Freq: Three times a day (TID) | SUBCUTANEOUS | Status: DC
  Administered 2014-06-12 – 2014-06-13 (×2): 3 [IU] via SUBCUTANEOUS

## 2014-06-12 MED ORDER — CEFAZOLIN SODIUM-DEXTROSE 2-3 GM-% IV SOLR
INTRAVENOUS | Status: AC
  Filled 2014-06-12: qty 50

## 2014-06-12 MED ORDER — CHLORHEXIDINE GLUCONATE 4 % EX LIQD
60.0000 mL | Freq: Once | CUTANEOUS | Status: DC
  Filled 2014-06-12: qty 60

## 2014-06-12 MED ORDER — HYDROMORPHONE HCL 1 MG/ML IJ SOLN: 0.5000 mg | INTRAMUSCULAR | Status: DC | PRN

## 2014-06-12 MED ORDER — LIDOCAINE HCL (CARDIAC) 20 MG/ML IV SOLN
INTRAVENOUS | Status: AC
  Filled 2014-06-12: qty 5

## 2014-06-12 MED ORDER — OXYCODONE HCL 5 MG/5ML PO SOLN: 5.0000 mg | Freq: Once | ORAL | Status: AC | PRN

## 2014-06-12 MED ORDER — ONDANSETRON HCL 4 MG/2ML IJ SOLN
INTRAMUSCULAR | Status: AC
  Filled 2014-06-12: qty 2

## 2014-06-12 MED ORDER — CANAGLIFLOZIN-METFORMIN HCL 150-500 MG PO TABS: 1.0000 | ORAL_TABLET | Freq: Two times a day (BID) | ORAL | Status: DC

## 2014-06-12 MED ORDER — STERILE WATER FOR INJECTION IJ SOLN
INTRAMUSCULAR | Status: AC
  Filled 2014-06-12: qty 10

## 2014-06-12 MED ORDER — ONDANSETRON HCL 4 MG PO TABS: 8.0000 mg | ORAL_TABLET | Freq: Four times a day (QID) | ORAL | Status: DC | PRN

## 2014-06-12 MED ORDER — EPHEDRINE SULFATE 50 MG/ML IJ SOLN
INTRAMUSCULAR | Status: AC
  Filled 2014-06-12: qty 1

## 2014-06-12 MED ORDER — PANTOPRAZOLE SODIUM 40 MG PO TBEC
40.0000 mg | DELAYED_RELEASE_TABLET | Freq: Every day | ORAL | Status: DC
  Administered 2014-06-13: 40 mg via ORAL
  Filled 2014-06-12: qty 1

## 2014-06-12 MED ORDER — LINAGLIPTIN 5 MG PO TABS
5.0000 mg | ORAL_TABLET | Freq: Every day | ORAL | Status: DC
  Administered 2014-06-13: 5 mg via ORAL
  Filled 2014-06-12: qty 1

## 2014-06-12 MED ORDER — FENTANYL CITRATE (PF) 250 MCG/5ML IJ SOLN
INTRAMUSCULAR | Status: AC
  Filled 2014-06-12: qty 5

## 2014-06-12 MED ORDER — MIDAZOLAM HCL 2 MG/2ML IJ SOLN
INTRAMUSCULAR | Status: AC
  Filled 2014-06-12: qty 2

## 2014-06-12 MED ORDER — FLUDROCORTISONE ACETATE 0.1 MG PO TABS
0.2000 mg | ORAL_TABLET | Freq: Every day | ORAL | Status: DC
  Administered 2014-06-12 – 2014-06-13 (×2): 0.2 mg via ORAL
  Filled 2014-06-12 (×2): qty 2

## 2014-06-12 MED ORDER — LIDOCAINE HCL (CARDIAC) 20 MG/ML IV SOLN
INTRAVENOUS | Status: DC | PRN
  Administered 2014-06-12: 60 mg via INTRAVENOUS

## 2014-06-12 MED ORDER — MAGNESIUM HYDROXIDE 400 MG/5ML PO SUSP: 30.0000 mL | Freq: Every day | ORAL | Status: DC | PRN

## 2014-06-12 MED ORDER — SENNA 8.6 MG PO TABS
1.0000 | ORAL_TABLET | Freq: Two times a day (BID) | ORAL | Status: DC
  Administered 2014-06-12: 8.6 mg via ORAL
  Filled 2014-06-12 (×3): qty 1

## 2014-06-12 MED ORDER — METOCLOPRAMIDE HCL 5 MG PO TABS: 5.0000 mg | ORAL_TABLET | Freq: Three times a day (TID) | ORAL | Status: DC | PRN

## 2014-06-12 MED ORDER — LORATADINE 10 MG PO TABS
10.0000 mg | ORAL_TABLET | Freq: Every day | ORAL | Status: DC
  Administered 2014-06-13: 10 mg via ORAL
  Filled 2014-06-12: qty 1

## 2014-06-12 MED ORDER — MELATONIN 3 MG PO TABS
1.0000 | ORAL_TABLET | Freq: Every day | ORAL | Status: DC
  Administered 2014-06-12: 3 mg via ORAL
  Filled 2014-06-12 (×2): qty 1

## 2014-06-12 MED ORDER — ACETAMINOPHEN 500 MG PO TABS
1000.0000 mg | ORAL_TABLET | Freq: Four times a day (QID) | ORAL | Status: AC
  Administered 2014-06-12 – 2014-06-13 (×4): 1000 mg via ORAL
  Filled 2014-06-12 (×4): qty 2

## 2014-06-12 MED ORDER — DOCUSATE SODIUM 100 MG PO CAPS
100.0000 mg | ORAL_CAPSULE | Freq: Two times a day (BID) | ORAL | Status: DC
  Filled 2014-06-12 (×2): qty 1

## 2014-06-12 MED ORDER — FENTANYL CITRATE (PF) 100 MCG/2ML IJ SOLN
INTRAMUSCULAR | Status: DC | PRN
  Administered 2014-06-12: 50 ug via INTRAVENOUS

## 2014-06-12 MED ORDER — MAGNESIUM CITRATE PO SOLN: 1.0000 | Freq: Once | ORAL | Status: AC | PRN

## 2014-06-12 MED ORDER — ONDANSETRON HCL 4 MG/2ML IJ SOLN: 4.0000 mg | Freq: Four times a day (QID) | INTRAMUSCULAR | Status: DC | PRN

## 2014-06-12 MED ORDER — METOCLOPRAMIDE HCL 5 MG/ML IJ SOLN: 5.0000 mg | Freq: Three times a day (TID) | INTRAMUSCULAR | Status: DC | PRN

## 2014-06-12 MED ORDER — PREDNISONE 10 MG PO TABS
10.0000 mg | ORAL_TABLET | Freq: Every day | ORAL | Status: DC
  Administered 2014-06-13: 10 mg via ORAL
  Filled 2014-06-12: qty 1

## 2014-06-12 SURGICAL SUPPLY — 56 items
BANDAGE ELASTIC 6 VELCRO ST LF (GAUZE/BANDAGES/DRESSINGS) ×2 IMPLANT
BANDAGE ESMARK 6X9 LF (GAUZE/BANDAGES/DRESSINGS) IMPLANT
BLADE SAW RECIP 87.9 MT (BLADE) ×5 IMPLANT
BNDG CMPR 9X6 STRL LF SNTH (GAUZE/BANDAGES/DRESSINGS)
BNDG COHESIVE 6X5 TAN STRL LF (GAUZE/BANDAGES/DRESSINGS) ×6 IMPLANT
BNDG ESMARK 6X9 LF (GAUZE/BANDAGES/DRESSINGS)
CANISTER SUCT 3000ML PPV (MISCELLANEOUS) ×3 IMPLANT
CHLORAPREP W/TINT 26ML (MISCELLANEOUS) ×3 IMPLANT
COVER SURGICAL LIGHT HANDLE (MISCELLANEOUS) ×3 IMPLANT
CUFF TOURNIQUET SINGLE 34IN LL (TOURNIQUET CUFF) ×2 IMPLANT
CUFF TOURNIQUET SINGLE 44IN (TOURNIQUET CUFF) IMPLANT
DRAPE EXTREMITY T 121X128X90 (DRAPE) ×3 IMPLANT
DRAPE INCISE IOBAN 66X45 STRL (DRAPES) ×3 IMPLANT
DRAPE PROXIMA HALF (DRAPES) ×6 IMPLANT
DRAPE U-SHAPE 47X51 STRL (DRAPES) ×6 IMPLANT
DRSG MEPITEL 4X7.2 (GAUZE/BANDAGES/DRESSINGS) ×3 IMPLANT
DRSG PAD ABDOMINAL 8X10 ST (GAUZE/BANDAGES/DRESSINGS) ×9 IMPLANT
ELECT CAUTERY BLADE 6.4 (BLADE) ×2 IMPLANT
ELECT REM PT RETURN 9FT ADLT (ELECTROSURGICAL) ×3
ELECTRODE REM PT RTRN 9FT ADLT (ELECTROSURGICAL) ×1 IMPLANT
EVACUATOR 1/8 PVC DRAIN (DRAIN) IMPLANT
GAUZE SPONGE 4X4 12PLY STRL (GAUZE/BANDAGES/DRESSINGS) ×3 IMPLANT
GLOVE BIO SURGEON STRL SZ7 (GLOVE) ×6 IMPLANT
GLOVE BIO SURGEON STRL SZ8 (GLOVE) ×3 IMPLANT
GLOVE BIOGEL PI IND STRL 7.5 (GLOVE) ×1 IMPLANT
GLOVE BIOGEL PI IND STRL 8 (GLOVE) ×1 IMPLANT
GLOVE BIOGEL PI INDICATOR 7.5 (GLOVE) ×2
GLOVE BIOGEL PI INDICATOR 8 (GLOVE) ×2
GOWN STRL REUS W/ TWL LRG LVL3 (GOWN DISPOSABLE) ×1 IMPLANT
GOWN STRL REUS W/ TWL XL LVL3 (GOWN DISPOSABLE) ×1 IMPLANT
GOWN STRL REUS W/TWL LRG LVL3 (GOWN DISPOSABLE) ×3
GOWN STRL REUS W/TWL XL LVL3 (GOWN DISPOSABLE) ×3
KIT BASIN OR (CUSTOM PROCEDURE TRAY) ×3 IMPLANT
KIT ROOM TURNOVER OR (KITS) ×3 IMPLANT
NS IRRIG 1000ML POUR BTL (IV SOLUTION) ×3 IMPLANT
PACK GENERAL/GYN (CUSTOM PROCEDURE TRAY) ×3 IMPLANT
PAD ARMBOARD 7.5X6 YLW CONV (MISCELLANEOUS) ×6 IMPLANT
PAD CAST 4YDX4 CTTN HI CHSV (CAST SUPPLIES) ×1 IMPLANT
PADDING CAST COTTON 4X4 STRL (CAST SUPPLIES) ×3
PADDING CAST COTTON 6X4 STRL (CAST SUPPLIES) ×3 IMPLANT
SPLINT FIBERGLASS 4X30 (CAST SUPPLIES) ×2 IMPLANT
SPONGE LAP 18X18 X RAY DECT (DISPOSABLE) IMPLANT
STAPLER VISISTAT 35W (STAPLE) ×2 IMPLANT
STOCKINETTE IMPERVIOUS LG (DRAPES) ×2 IMPLANT
SUT MNCRL AB 3-0 PS2 18 (SUTURE) ×9 IMPLANT
SUT PDS 2 0 (SUTURE) IMPLANT
SUT PDS AB 0 CT 36 (SUTURE) ×3 IMPLANT
SUT PROLENE 3 0 PS 2 (SUTURE) ×3 IMPLANT
SUT PROLENE 3 0 SH 48 (SUTURE) IMPLANT
SUT SILK 2 0 (SUTURE) ×3
SUT SILK 2-0 18XBRD TIE 12 (SUTURE) ×1 IMPLANT
SWAB COLLECTION DEVICE MRSA (MISCELLANEOUS) IMPLANT
TOWEL OR 17X24 6PK STRL BLUE (TOWEL DISPOSABLE) ×3 IMPLANT
TOWEL OR 17X26 10 PK STRL BLUE (TOWEL DISPOSABLE) ×3 IMPLANT
TUBE ANAEROBIC SPECIMEN COL (MISCELLANEOUS) IMPLANT
WATER STERILE IRR 1000ML POUR (IV SOLUTION) ×3 IMPLANT

## 2014-06-12 NOTE — Progress Notes (Signed)
Utilization review completed.  

## 2014-06-12 NOTE — Anesthesia Preprocedure Evaluation (Signed)
Anesthesia Evaluation  Patient identified by MRN, date of birth, ID band Patient awake    Reviewed: Allergy & Precautions, NPO status , Patient's Chart, lab work & pertinent test results  Airway Mallampati: II   Neck ROM: full    Dental   Pulmonary shortness of breath, sleep apnea , COPDformer smoker,    + decreased breath sounds      Cardiovascular hypertension, Rhythm:regular Rate:Normal     Neuro/Psych  Headaches, PSYCHIATRIC DISORDERS Anxiety Depression Bipolar Disorder H/o TBI  Neuromuscular disease    GI/Hepatic GERD-  ,  Endo/Other  diabetes, Type 2  Renal/GU      Musculoskeletal  (+) Arthritis -,   Abdominal   Peds  Hematology   Anesthesia Other Findings   Reproductive/Obstetrics                             Anesthesia Physical Anesthesia Plan  ASA: III  Anesthesia Plan: General   Post-op Pain Management:    Induction: Intravenous  Airway Management Planned: LMA  Additional Equipment:   Intra-op Plan:   Post-operative Plan:   Informed Consent: I have reviewed the patients History and Physical, chart, labs and discussed the procedure including the risks, benefits and alternatives for the proposed anesthesia with the patient or authorized representative who has indicated his/her understanding and acceptance.     Plan Discussed with: CRNA, Anesthesiologist and Surgeon  Anesthesia Plan Comments:         Anesthesia Quick Evaluation

## 2014-06-12 NOTE — Plan of Care (Signed)
Problem: Acute Rehab PT Goals(only PT should resolve) Goal: PT Additional Goal #1 Pt will demonstrate ability to navigate manual WC >250 ft to increase functional independence.

## 2014-06-12 NOTE — Anesthesia Postprocedure Evaluation (Signed)
Anesthesia Post Note  Patient: [name]  Procedure(s) Performed: Procedure(s) (LRB): LEFT AMPUTATION BELOW KNEE (Left)  Anesthesia type: General  Patient location: PACU  Post pain: Pain level controlled and Adequate analgesia  Post assessment: Post-op Vital signs reviewed, Patient's Cardiovascular Status Stable, Respiratory Function Stable, Patent Airway and Pain level controlled  Last Vitals:  Filed Vitals:   06/12/14 0930  BP:   Pulse: 42  Temp:   Resp: 12    Post vital signs: Reviewed and stable  Level of consciousness: awake, alert  and oriented  Complications: No apparent anesthesia complications

## 2014-06-12 NOTE — Anesthesia Procedure Notes (Signed)
Procedure Name: LMA Insertion Date/Time: 06/12/2014 7:31 AM Performed by: Margaree MackintoshYACOUB, Kimberlye Dilger B Pre-anesthesia Checklist: Patient identified, Timeout performed, Emergency Drugs available, Suction available and Patient being monitored Patient Re-evaluated:Patient Re-evaluated prior to inductionOxygen Delivery Method: Circle system utilized Preoxygenation: Pre-oxygenation with 100% oxygen Intubation Type: IV induction Ventilation: Mask ventilation without difficulty LMA: LMA inserted LMA Size: 5.0 Number of attempts: 1 Placement Confirmation: positive ETCO2 and breath sounds checked- equal and bilateral Tube secured with: Tape Dental Injury: Teeth and Oropharynx as per pre-operative assessment

## 2014-06-12 NOTE — Progress Notes (Signed)
Rehab Admissions Coordinator Note:  Patient was screened by Clois DupesBoyette, Orlan Aversa Godwin for appropriateness for an Inpatient Acute Rehab Consult per PT recommendation.  At this time, we are recommending Skilled Nursing Facility.[facility] until [facility]/2013. [facility]. Unless there is a clear d/c to home with family support in a few weeks or an assisted living facility that can manage his care, inpt rehab admission is not appropriate. Please call me with any questions.  Clois DupesBoyette, Viyaan Champine Godwin 06/12/2014, 4:45 PM  I can be reached at [phone].

## 2014-06-12 NOTE — Brief Op Note (Signed)
06/12/2014  8:44 AM  PATIENT:  [name]  59 y.o. male  PRE-OPERATIVE DIAGNOSIS:  LEFT FOOT DIABETIC ULCER/ANKLE INSTABILITY  POST-OPERATIVE DIAGNOSIS:  LEFT FOOT DIABETIC ULCER/ANKLE INSTABILITY  Procedure(s):  LEFT AMPUTATION BELOW KNEE  SURGEON:  Toni ArthursJohn Vinicius Brockman, MD  ASSISTANT: n/a  ANESTHESIA:   General  EBL:  minimal   TOURNIQUET:   Total Tourniquet Time Documented: Thigh (Left) - 38 minutes Total: Thigh (Left) - 38 minutes  COMPLICATIONS:  None apparent  DISPOSITION:  Extubated, awake and stable to recovery.  DICTATION ID:  [id number]
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
## 2014-06-12 NOTE — Evaluation (Signed)
Physical Therapy Evaluation Patient Details Name: [name] MRN: [id number] DOB: 07/26/1955 Today's Date: 06/12/2014   History of Present Illness  Pt is a 59 y/o M s/p L BKA 2/2 chronic non healing ulcer of L foot.  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  Clinical Impression  Patient is s/p above surgery resulting in functional limitations due to the deficits listed below (see PT Problem List). Pt demonstrated ability to hop on LLE prosthesis to perform a stand pivot transfer to his recliner chair.  Pt is extremely motivated and will benefit from intensive rehab to increase functional independence.  Patient will benefit from skilled PT to increase their independence and safety with mobility to allow discharge to the venue listed below.      Follow Up Recommendations CIR;Supervision for mobility/OOB    Equipment Recommendations  Wheelchair (measurements PT);Wheelchair cushion (measurements PT);3in1 (PT)    Recommendations for Other Services       Precautions / Restrictions Precautions Precautions: Fall Required Braces or Orthoses: Knee Immobilizer - Left Knee Immobilizer - Left: Other (comment) (no order; KI on pt upon arrival) Restrictions Weight Bearing Restrictions: Yes LLE Weight Bearing: Non weight bearing      Mobility  Bed Mobility Overal bed mobility: Needs Assistance Bed Mobility: Supine to Sit     Supine to sit: Supervision     General bed mobility comments: supervision for safety and verbal cues for proper sequencing; mod use of bed rails  Transfers Overall transfer level: Needs assistance Equipment used: Rolling walker (2 wheeled) Transfers: Sit to/from [employer]Stand;Stand Pivot Transfers Sit to Stand: Min assist Stand pivot transfers: Min assist        General transfer comment: Min A for managing RW and cues for hand placement and to stand upright.  Pt donned R prosthesis Ind prior to transfer.  Pt demonstrated hopping on RLE during stand pivot.  Ambulation/Gait             General Gait Details: pivotal steps only  Careers information officertairs            Wheelchair Mobility    Modified Rankin (Stroke Patients Only)       Balance Overall balance assessment: Needs assistance Sitting-balance support: No upper extremity supported;Feet supported Sitting balance-Leahy Scale: Fair     Standing balance support: Bilateral upper extremity supported;During functional activity Standing balance-Leahy Scale: Fair                               Pertinent Vitals/Pain Pain Assessment: 0-10 Pain Score: 6  (3/10 lying supine in bed) Pain Location: LLE from hip to distal extremity Pain Descriptors / Indicators: Throbbing ("Intense") Pain Intervention(s): Limited activity within patient's tolerance;Monitored during session;Repositioned    Home Living Family/patient expects to be discharged to:: Skilled nursing facility Living Arrangements: Parent Available Help at Discharge: Available 24 hours/day (24/7 assist from assited living facility) Type of Home: Assisted living (in 6 person facility) Home Access: Other (comment) (unsure; has not seen where he will be)     Home Layout: Other (Comment) (unsure) Home Equipment: Walker - 2 wheels;Cane - single point Additional Comments[facility] [place] [facility]  [facility] from [facility] ([facility])    Prior Function Level of Independence: Independent  with assistive device(s)         Comments: RW for short distances (300-400 ft) around facility; WC at all other times      Hand Dominance   Dominant Hand: Right    Extremity/Trunk Assessment   Upper Extremity Assessment: Defer to OT evaluation           Lower Extremity Assessment: RLE  deficits/detail;LLE deficits/detail RLE Deficits / Details: previous R BKA LLE Deficits / Details: s/p L BKA  Cervical / Trunk Assessment: Normal  Communication   Communication: No difficulties  Cognition Arousal/Alertness: Lethargic Behavior During Therapy: Flat affect Overall Cognitive Status: Within Functional Limits for tasks assessed                      General Comments      Exercises Amputee Exercises Quad Sets: AROM;Both;10 reps;Supine Hip Extension: AROM;Left;10 reps;Supine Hip ABduction/ADduction: AROM;Left;10 reps;Supine Knee Extension: AROM;Both;5 reps;Supine Straight Leg Raises: AROM;Both;10 reps;Supine      Assessment/Plan    PT Assessment Patient needs continued PT services  PT Diagnosis Difficulty walking;Abnormality of gait;Generalized weakness;Acute pain   PT Problem List Decreased strength;Decreased range of motion;Decreased activity tolerance;Decreased balance;Decreased mobility;Decreased coordination;Decreased knowledge of use of DME;Decreased safety awareness;Decreased knowledge of precautions;Impaired sensation;Decreased skin integrity;Pain  PT Treatment Interventions DME instruction;Gait training;Stair training;Functional mobility training;Therapeutic activities;Therapeutic exercise;Balance training;Neuromuscular re-education;Patient/family education;Wheelchair mobility training;Modalities   PT Goals (Current goals can be found in the Care Plan section) Acute Rehab PT Goals Patient Stated Goal: to go to rehab PT Goal Formulation: With patient Time For Goal Achievement: 06/19/14 Potential to Achieve Goals: Good    Frequency Min 3X/week   Barriers to discharge Other (comment) (Unclear of home setup at this time)      Co-evaluation               End of Session Equipment Utilized During Treatment: Gait belt Activity Tolerance: Patient tolerated treatment well Patient left: in chair;with call bell/phone within reach;with nursing/sitter  in room Nurse Communication: Mobility status;Precautions;Weight bearing status         Time: 1610-9604 PT Time Calculation (min) (ACUTE ONLY): 27 min   Charges:   PT Evaluation $Initial PT Evaluation Tier I: 1 Procedure PT Treatments $Therapeutic Exercise: 8-22 mins   PT G Codes:       Michail Jewels PT, DPT [phone] Pager: [phone] 06/12/2014, 4:22 PM

## 2014-06-12 NOTE — Transfer of Care (Signed)
Immediate Anesthesia Transfer of Care Note  Patient: [name]  Procedure(s) Performed: Procedure(s): LEFT AMPUTATION BELOW KNEE (Left)  Patient Location: PACU  Anesthesia Type:General  Level of Consciousness: awake, alert  and oriented  Airway & Oxygen Therapy: Patient Spontanous Breathing and Patient connected to nasal cannula oxygen  Post-op Assessment: Report given to RN and Post -op Vital signs reviewed and stable  Post vital signs: Reviewed and stable  Last Vitals:  Filed Vitals:   06/12/14 0845  BP:   Pulse: 53  Temp:   Resp: 12    Complications: No apparent anesthesia complications

## 2014-06-13 ENCOUNTER — Encounter (HOSPITAL_COMMUNITY): Payer: Self-pay | Admitting: Orthopedic Surgery

## 2014-06-13 LAB — GLUCOSE, CAPILLARY
GLUCOSE-CAPILLARY: 183 mg/dL — AB (ref 70–99)
Glucose-Capillary: 104 mg/dL — ABNORMAL HIGH (ref 70–99)

## 2014-06-13 MED ORDER — OXYCODONE HCL 5 MG PO TABS: 5.0000 mg | ORAL_TABLET | ORAL | Status: DC | PRN

## 2014-06-13 MED ORDER — TUBERCULIN PPD 5 UNIT/0.1ML ID SOLN
5.0000 [IU] | Freq: Once | INTRADERMAL | Status: AC
  Administered 2014-06-13: 5 [IU] via INTRADERMAL
  Filled 2014-06-13: qty 0.1

## 2014-06-13 NOTE — Op Note (Signed)
NAME:  [name], [name]             ACCOUNT NO.:  [id number]  MEDICAL RECORD NO.:  [id number]  LOCATION:  [id number]                        FACILITY:  [facility]  PHYSICIAN:  Toni ArthursJohn Eveline Sauve, MD        DATE OF BIRTH:  Apr 27, 1955  DATE OF PROCEDURE:  06/12/2014 DATE OF DISCHARGE:                              OPERATIVE REPORT   PREOPERATIVE DIAGNOSES: 1. Left foot nonhealing diabetic ulcer. 2. Left chronic ankle instability.  POSTOPERATIVE DIAGNOSES: 1. Left foot nonhealing diabetic ulcer. 2. Left chronic ankle instability.  PROCEDURE:  Left below-knee amputation.  SURGEON:  Toni ArthursJohn Eda Magnussen, MD  ANESTHESIA:  General.  ESTIMATED BLOOD LOSS:  Minimal.  TOURNIQUET TIME:  38 minutes, 250 mmHg.  COMPLICATIONS:  None apparent.  DISPOSITION:  Extubated, awake, and stable to recovery.  INDICATIONS FOR PROCEDURE:  The patient is a 59 year old male with past medical history significant for diabetes.  He initially underwent fifth ray amputation by Dr. Lajoyce Cornersuda.  After I took over his care, he went on to have Chopart amputation.  He developed chronic varus ankle instability and recurrent nonhealing diabetic foot ulcer.  He has failed all nonoperative treatment to date.  We had a long discussion about treatment options including attempted stabilization of the ankle with continued wound care versus below-knee amputation.  He elects below-knee amputation to treat this chronic condition.  He understands the risks and benefits, the alternative treatment options, and elects surgical treatment.  He specifically understands risks of bleeding, infection, nerve damage, blood clots, need for additional surgery, continued pain, revision amputation, and death.  PROCEDURE IN DETAIL:  After preoperative consent was obtained and the correct operative site was identified, the patient was brought to the operating room and placed supine on the operating table.  General anesthesia was induced.  Preoperative antibiotics  were administered. Surgical time-out was taken.  Left lower extremity was prepped and draped in standard sterile fashion with tourniquet around the thigh. The extremity was exsanguinated and the tourniquet was inflated to 250 mmHg.  A posterior flap incision was marked on the skin 15 cm from the medial joint line of the knee.  The incision was made.  Sharp dissection was carried down through skin, subcutaneous tissue circumferentially. Subperiosteal dissection was then carried approximately 1 cm up from the cut marked on the skin at the tibia.  The reciprocating saw was then used to cut through the tibia.  The fibula was then cut a couple of centimeter proximal to the tibial cut.  The amputation knife was then used to cut through the posterior soft tissues, contouring the flap appropriately.  The named nerves were all divided sharply on gentle traction and allowed to retract into an appropriate soft tissue envelope.  All named vascular structures were doubly suture ligated with 2-0 silk ties.  Wound was irrigated copiously after bevelling the tibial cut with a rasp.  The gastrocnemius tendon was repaired to the anterior periosteum with imbricating sutures of #1 PDS.  The deep fascia posteriorly was repaired to the anterior fascia with inverted simple sutures of 0 PDS.  Subcutaneous tissues were approximated with inverted simple sutures of 3-0 Monocryl and horizontal mattress and simple sutures of 3-0 nylon were used  to close the skin incision.  Sterile dressings were applied followed by a compression wrap and a knee immobilizer.  Tourniquet was released at 38 minutes after application of the dressings.  The patient was awakened from anesthesia and transported to the recovery room in stable condition.  20 mL of Exparel mixed with 20 mL of normal saline were infiltrated into the subcutaneous tissues around the stump after closure of the wound.  FOLLOWUP PLAN:  The patient will be  nonweightbearing on the left lower extremity.  He will have physical therapy evaluation tomorrow and plan to return him to his skilled nursing facility as quickly as possible.     Toni Arthurs, MD     JH/MEDQ  D:  06/12/2014  T:  06/13/2014  Job:  [id number]

## 2014-06-13 NOTE — Discharge Summary (Signed)
Physician Discharge Summary  Patient ID: [name] MRN: [id number] DOB/AGE: [birth year] 60 y.o.  Admit date: 06/12/2014 Discharge date: 06/13/2014  Admission Diagnoses:  DIABETES,  Depression, bipolar disorder, s/p R BKA, diabetic foot ulcer left.  Discharge Diagnoses:  Active Problems:   Diabetic foot ulcer associated with diabetes mellitus due to underlying condition as above s/p L BKA   Discharged Condition: stable  Hospital Course: Pt was admitted and taken to the OR for L BKA.  He tolerated the procedure well and was tranaferred to 5N where he remained until discharge.  He did well with PT and is discharged to SNF in stable condition for further PT and OT.  Consults: None  Significant Diagnostic Studies: angiography: none  Treatments: surgery: L BKA  Discharge Exam: Blood pressure 144/79, pulse 46, temperature 98.6 F (37 C), resp. rate 16, height  (1.778 m), weight 87.998 kg (194 lb), SpO2 95 %. wn wd male in nad.  L L E dressed and dry.  A and O x 4.  Mood depressed.  Affect flat.  Disposition: snf  Discharge Instructions    Call MD / Call 911    Complete by:  As directed   If you experience chest pain or shortness of breath, CALL 911 and be transported to the hospital emergency room.  If you develope a fever above 101 F, pus (white drainage) or increased drainage or redness at the wound, or calf pain, call your surgeon's office.     Constipation Prevention    Complete by:  As directed   Drink plenty of fluids.  Prune juice may be helpful.  You may use a stool softener, such as Colace (over the counter) 100 mg twice a day.  Use MiraLax (over the counter) for constipation as needed.     Diet - low sodium heart healthy    Complete by:  As directed      Increase activity slowly as tolerated    Complete by:  As directed      Non weight bearing    Complete by:  As directed   Laterality:  left  Extremity:  Lower            Medication List    STOP  taking these medications        aspirin-acetaminophen-caffeine 250-250-65 MG per tablet  Commonly known as:  EXCEDRIN MIGRAINE      TAKE these medications        Canagliflozin-Metformin HCl 150-500 MG Tabs  Take 1 tablet by mouth 2 (two) times daily.     Canagliflozin-Metformin HCl 50-500 MG Tabs  Commonly known as:  INVOKAMET  Take 1 tablet by mouth 2 (two) times daily with a meal.     clonazePAM 0.5 MG tablet  Commonly known as:  KLONOPIN  Take one tablet by mouth three times daily for anxiety     DECUBI-VITE PO  Take 1 tablet by mouth 2 (two) times daily.     fludrocortisone 0.1 MG tablet  Commonly known as:  FLORINEF  Take 0.2 mg by mouth daily.     FLUoxetine 20 MG capsule  Commonly known as:  PROZAC  Take 60 mg by mouth daily.     fluticasone 50 MCG/ACT nasal spray  Commonly known as:  FLONASE  Place 1 spray into both nostrils daily. For allergies     linagliptin 5 MG Tabs tablet  Commonly known as:  TRADJENTA  Take 1 tablet (5 mg total) by mouth daily.  lisinopril 5 MG tablet  Commonly known as:  PRINIVIL,ZESTRIL  Take 1 tablet (5 mg total) by mouth daily.     loratadine 10 MG tablet  Commonly known as:  CLARITIN  Take 10 mg by mouth daily.     lurasidone 80 MG Tabs tablet  Commonly known as:  LATUDA  Take 80 mg by mouth at bedtime. Give with 20mg  tablet to equal 100mg .     Lurasidone HCl 20 MG Tabs  Take 20 mg by mouth at bedtime. Give with 80mg  tablet to equal 100mg      Lurasidone HCl 120 MG Tabs  Take 1 tablet (120 mg total) by mouth at bedtime.     Melatonin 3 MG Tabs  Take 1 tablet by mouth at bedtime.     ondansetron 8 MG tablet  Commonly known as:  ZOFRAN  Take 8 mg by mouth every 6 (six) hours as needed for nausea or vomiting.     oxyCODONE 5 MG immediate release tablet  Commonly known as:  Oxy IR/ROXICODONE  Take 1-2 tablets (5-10 mg total) by mouth every 4 (four) hours as needed for moderate pain.     pantoprazole 40 MG tablet   Commonly known as:  PROTONIX  Take 40 mg by mouth daily.     predniSONE 10 MG tablet  Commonly known as:  DELTASONE  Take 10 mg by mouth daily with breakfast.     pregabalin 75 MG capsule  Commonly known as:  LYRICA  Take 1 capsule (75 mg total) by mouth 3 (three) times daily. For pains     sennosides-docusate sodium 8.6-50 MG tablet  Commonly known as:  SENOKOT-S  Take 2 tablets by mouth daily as needed for constipation.           Follow-up Information    Follow up with Toni ArthursHEWITT, Raysha Tilmon, MD In 3 weeks.   Specialty:  Orthopedic Surgery   Contact information:   [address] [place] [address] [phone]       Signed: Toni ArthursHEWITT, Mizani Dilday 06/13/2014, 7:46 AM

## 2014-06-13 NOTE — Clinical Social Work Placement (Signed)
   CLINICAL SOCIAL WORK PLACEMENT  NOTE  Date:  06/13/2014  Patient Details  Name: Gregory DresserDouglas E Deramo MRN: [id number] Date of Birth: 1955/04/23  Clinical Social Work is seeking post-discharge placement for this patient at the [facility] level of care (*CSW will initial, date and re-position this form in  chart as items are completed):  Yes   Patient/family provided with [facility] Clinical Social Work Department's list of facilities offering this level of care within the geographic area requested by the patient (or if unable, by the patient's family).  Yes   Patient/family informed of their freedom to choose among providers that offer the needed level of care, that participate in Medicare, Medicaid or managed care program needed by the patient, have an available bed and are willing to accept the patient.  Yes   Patient/family informed of [place]'s ownership interest in [facility] and [facility], as well as of the fact that they are under no obligation to receive care at these facilities.  PASRR submitted to EDS on       PASRR number received on       Existing PASRR number confirmed on 06/13/14     FL2 transmitted to all facilities in geographic area requested by pt/family on       FL2 transmitted to all facilities within larger geographic area on       Patient informed that his/her managed care company has contracts with or will negotiate with certain facilities, including the following:         (n/a)   Patient/family informed of bed offers received.  [facility]     Physician recommends and patient chooses bed at  (n/a)    Patient to be [facility] on 06[facility].  Patient to be transferred to facility by PTAR     Patient family notified on 06/13/14 of transfer.  Name of family member notified:  patient is alert and oriented x4 and reports having no familial support     PHYSICIAN Please sign FL2, Please prepare  priority discharge summary, including medications     Additional Comment:    _______________________________________________ Rondel BatonIngle, Geneviene Tesch C, LCSW 06/13/2014, 10:55 AM

## 2014-06-13 NOTE — Progress Notes (Addendum)
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    PT Comments    Patient is making good progress with PT.  Pt demonstrated ability to ambulate 20 ft this session.  Pt will benefit from continued skilled PT services to increase functional independence and safety.  If pt is d/c to assisted living pt will benefit from HHPT.  Per SW pt will not need to ascend/descend steps upon d/c to assisted living facility.    Follow Up Recommendations  CIR;Supervision for mobility/OOB     Equipment Recommendations  Wheelchair (measurements PT);Wheelchair cushion (measurements PT);3in1 (PT)    Recommendations for Other Services Rehab consult     Precautions / Restrictions Precautions Precautions: Fall Required Braces or Orthoses: Knee Immobilizer - Left Knee Immobilizer - Left: Other (comment) (no order; KI on pt upon arrival) Restrictions Weight Bearing Restrictions: Yes LLE Weight Bearing: Non weight bearing    Mobility  Bed Mobility               General bed mobility comments: in recliner  Transfers Overall transfer level: Needs assistance Equipment used: Rolling walker (2 wheeled) Transfers: Sit to/from Stand Sit to Stand: Min guard         General transfer comment: Min guard.  Veral cues for proper hand positioning, good carryover from last session  Ambulation/Gait Ambulation/Gait assistance: Min guard Ambulation Distance (Feet): 20 Feet Assistive device: Rolling walker (2 wheeled) Gait Pattern/deviations: Antalgic;Trunk flexed;Decreased weight shift to  left;Decreased step length - right (hop on RLE prosthesis)   Gait velocity interpretation: Below normal speed for age/gender General Gait Details: Pt swings R prosthesis forward to perform hop on RLE using RW.  Pt requests to sit after ambulating 20 ft in room 2/2 fatigue and pain.   Stairs            Wheelchair Mobility    Modified Rankin (Stroke Patients Only)       Balance Overall balance assessment: Needs assistance Sitting-balance support: No upper extremity supported;Feet supported Sitting balance-Leahy Scale: Good     Standing balance support: Bilateral upper extremity supported;During functional activity Standing balance-Leahy Scale: Fair                      Cognition Arousal/Alertness: Awake/alert Behavior During Therapy: Flat affect Overall Cognitive Status: Within Functional Limits for tasks assessed                      Exercises Amputee Exercises Quad Sets: AROM;Both;10 reps;Seated Hip Extension: AROM;Left;10 reps;Seated Straight Leg Raises: AROM;Left;10 reps;Seated    General Comments General comments (skin integrity, edema, etc.): It remains unclear as to pt's d/c plan.  SW and CM are working on communication w/ assisted living pt is planning to d/c to once leaving [facility].  PT is waiting to hear from SW about pt's home layout to determine if stair training needs to be completed prior to d/c.      Pertinent Vitals/Pain Pain Assessment: 0-10 Pain Score: 6  Pain Location: distal LLE Pain Descriptors / Indicators: Throbbing;Squeezing Pain Intervention(s): Limited activity  within patient's tolerance;Monitored during session;Repositioned    Home Living                      Prior Function            PT Goals (current goals can now be found in the care plan section) Acute Rehab PT Goals Patient Stated Goal: to go to rehab PT Goal Formulation: With patient Time For Goal Achievement: 06/19/14 Potential to Achieve Goals:  Good Progress towards PT goals: Progressing toward goals    Frequency  Min 3X/week    PT Plan Current plan remains appropriate    Co-evaluation             End of Session Equipment Utilized During Treatment: Gait belt Activity Tolerance: Patient limited by fatigue;Patient tolerated treatment well Patient left: in chair;with call bell/phone within reach     Time: 1020-1046 PT Time Calculation (min) (ACUTE ONLY): 26 min  Charges:  $Gait Training: 8-22 mins $Therapeutic Exercise: 8-22 mins                    G Codes:      [name] PT, [place] [phone] Pager: [phone] 06/13/2014, 1:04 PM

## 2014-06-13 NOTE — Discharge Instructions (Signed)
Gregory ArthursJohn Reyanna Baley, MD [facility]  Please read the following information regarding your care after surgery.  Medications  You only need a prescription for the narcotic pain medicine (ex. oxycodone, Percocet, Norco).  All of the other medicines listed below are available over the counter. X acetominophen (Tylenol) 650 mg every 4-6 hours as you need for minor pain X oxycodone as prescribed for moderate to severe pain ?   Narcotic pain medicine (ex. oxycodone, Percocet, Vicodin) will cause constipation.  To prevent this problem, take the following medicines while you are taking any pain medicine. X docusate sodium (Colace) 100 mg twice a day  X senna (Senokot) 2 tablets twice a day  Weight Bearing ? Bear weight when you are able on your operated leg or foot. ? Bear weight only on the heel of your operated foot in the post-op shoe. X Do not bear any weight on the operated leg or foot.  Cast / Splint / Dressing x Keep your splint or cast clean and dry.  Dont put anything (coat hanger, pencil, etc) down inside of it.  If it gets damp, use a hair dryer on the cool setting to dry it.  If it gets soaked, call the office to schedule an appointment for a cast change. ? Remove your dressing 3 days after surgery and cover the incisions with dry dressings.    After your dressing, cast or splint is removed; you may shower, but do not soak or scrub the wound.  Allow the water to run over it, and then gently pat it dry.  Swelling It is normal for you to have swelling where you had surgery.  To reduce swelling and pain, keep your toes above your nose for at least 3 days after surgery.  It may be necessary to keep your foot or leg elevated for several weeks.  If it hurts, it should be elevated.  Follow Up Call my office at [phone] when you are discharged from the hospital or surgery center to schedule an appointment to be seen two weeks after surgery.  Call my office at [phone] if you  develop a fever >101.5 F, nausea, vomiting, bleeding from the surgical site or severe pain.

## 2014-06-13 NOTE — Discharge Planning (Addendum)
Patient will discharge today per MD order. [facility] prior to transportation to: [phone] Transportation: [facility] scheduled at 2:35pm   RN contacted CSW to inform that TB test had been performed and CSW requested to contact transportation.  Transportation arranged.  [facility] agreeable.  CSW sent discharge summary to SNF for review.  Packet is complete.  RN and patient aware of discharge plans.  CSW hard [facility].  Packet complete and on chart.  Patient [facility].  CSW made [facility] aware and arrangements being made.  Vickii PennaGina Francie Keeling, LCSWA [phone]  Psychiatric & Orthopedics (5N 1-16) Clinical Social Worker

## 2014-06-13 NOTE — Progress Notes (Signed)
[facility]. Report will be called to facility. Waiting on PT[facility] transport for discharge.

## 2014-06-13 NOTE — Progress Notes (Signed)
Subjective: 1 Day Post-Op Procedure(s) (LRB): LEFT AMPUTATION BELOW KNEE (Left) Patient reports pain as mild.  Controlled with oral pain meds.  Tolerating regular diet.  Objective: Vital signs in last 24 hours: Temp:  [97.7 F (36.5 C)-98.6 F (37 C)] 98.6 F (37 C) (04/29 0123) Pulse Rate:  [42-54] 46 (04/29 0501) Resp:  [8-16] 16 (04/29 0501) BP: (89-168)/(48-79) 144/79 mmHg (04/29 0501) SpO2:  [93 %-100 %] 95 % (04/29 0501)  Intake/Output from previous day: 04/28 0701 - 04/29 0700 In: 800 [I.V.:800] Out: 1825 [Urine:1825] Intake/Output this shift:     Recent Labs  06/12/14 1910  HGB 13.3    Recent Labs  06/12/14 1910  WBC 10.7*  RBC 4.92  HCT 38.9*  PLT 206    Recent Labs  06/12/14 1910  CREATININE 0.99   No results for input(s): LABPT, INR in the last 72 hours.  L LE dressed and dry.  Knee immobilizer in p,lace.  Assessment/Plan: 1 Day Post-Op Procedure(s) (LRB): LEFT AMPUTATION BELOW KNEE (Left) Discharge to SNF  Stump protector today.  PT.  Toni ArthursHEWITT, [name] 06/13/2014, 7:40 AM

## 2014-06-13 NOTE — Clinical Social Work Note (Signed)
Clinical Social Work Assessment  Patient Details  Name: [name] MRN: [id number] Date of Birth: 03/13/1955  Date of referral:  06/13/14               Reason for consult:  Facility Placement                Permission sought to share information with:  Chartered certified accountant granted to share information::  Yes, Verbal Permission Granted  Name::        Agency::   ([place] [facility] and [facility])  Relationship::     Contact Information:     Housing/Transportation Living arrangements for the past 2 months:  [place] of Information:  Patient Patient Interpreter Needed:  None Criminal Activity/Legal Involvement Pertinent to Current Situation/Hospitalization:  No - Comment as needed Significant Relationships:  None Lives with:  Facility Resident Do you feel safe going back to the place where you live?  Yes Need for family participation in patient care:  No (Coment)  Care giving concerns:  Patient states has no supports in place at this time.  Patient is alert and oriented x4 and able to make his own decisions.   Social Worker assessment / plan:  CSW met with patient at bedside to review disposition plans.  Patient was from [facility], but had a facility change in place prior to this hospitalization.  Patient has arranged for himself to be moved to [facility] in [place].  CSW faxed discharge summary to [facility] for medication review.  [name] states HH is welcomed by outside agencies.  [name] agreeable to patient admission today.  Employment status:  Unemployed, Disabled (Comment on whether or not currently receiving Disability) (Bilateral amputation) Insurance information:  Medicaid In [place] PT Recommendations:  [place], 24 Hour Supervision Information / Referral to community resources:  Acute Rehab  Patient/Family's Response to care:  Patient is accepting of his bilateral amputation.  Patient is  hopeful to regain mobility/ambulation though patient realizes it will be a challenge at first.  Patient is optimistic though realistic regarding prognosis and diagnosis.  Patient/Family's Understanding of and Emotional Response to Diagnosis, Current Treatment, and Prognosis:  Patient is accepting and hopeful for a life of independence though realizes that is a future goal.  Emotional Assessment Appearance:  Appears stated age Attitude/Demeanor/Rapport:   (appropriate) Affect (typically observed):  Accepting, Appropriate, Stable, Pleasant, Hopeful Orientation:  Oriented to Self, Oriented to Place, Oriented to  Time, Oriented to Situation Alcohol / Substance use:  Never Used (denies use) Psych involvement (Current and /or in the community):  No (Comment)  Discharge Needs  Concerns to be addressed:  No discharge needs identified Readmission within the last 30 days:    Current discharge risk:  None Barriers to Discharge:  No Barriers Identified   Dulcy Fanny, LCSW 06/13/2014, 10:52 AM

## 2014-06-13 NOTE — Progress Notes (Signed)
Ortho tech called and notified of need for left lower extremity stump protector. Ortho tech will call biomed and order device.

## 2014-06-13 NOTE — Progress Notes (Signed)
[employer] will follow up with [facility] at [facility] [place] to fit stump protector.

## 2014-07-12 ENCOUNTER — Encounter: Payer: Self-pay | Admitting: Emergency Medicine

## 2014-07-12 ENCOUNTER — Emergency Department
Admission: EM | Admit: 2014-07-12 | Discharge: 2014-07-13 | Disposition: A | Discharge status: Home / Self Care | Payer: Medicaid Other | Attending: Emergency Medicine | Admitting: Emergency Medicine

## 2014-07-12 ENCOUNTER — Other Ambulatory Visit: Payer: Self-pay

## 2014-07-12 DIAGNOSIS — Y998 Other external cause status: Secondary | ICD-10-CM | POA: Diagnosis not present

## 2014-07-12 DIAGNOSIS — Y9389 Activity, other specified: Secondary | ICD-10-CM | POA: Insufficient documentation

## 2014-07-12 DIAGNOSIS — T383X1A Poisoning by insulin and oral hypoglycemic [antidiabetic] drugs, accidental (unintentional), initial encounter: Secondary | ICD-10-CM | POA: Diagnosis not present

## 2014-07-12 DIAGNOSIS — Z87891 Personal history of nicotine dependence: Secondary | ICD-10-CM | POA: Insufficient documentation

## 2014-07-12 DIAGNOSIS — E11649 Type 2 diabetes mellitus with hypoglycemia without coma: Secondary | ICD-10-CM | POA: Diagnosis not present

## 2014-07-12 DIAGNOSIS — Z791 Long term (current) use of non-steroidal anti-inflammatories (NSAID): Secondary | ICD-10-CM | POA: Insufficient documentation

## 2014-07-12 DIAGNOSIS — I10 Essential (primary) hypertension: Secondary | ICD-10-CM | POA: Diagnosis not present

## 2014-07-12 DIAGNOSIS — Z7952 Long term (current) use of systemic steroids: Secondary | ICD-10-CM | POA: Diagnosis not present

## 2014-07-12 DIAGNOSIS — Y9289 Other specified places as the place of occurrence of the external cause: Secondary | ICD-10-CM | POA: Diagnosis not present

## 2014-07-12 DIAGNOSIS — Z79899 Other long term (current) drug therapy: Secondary | ICD-10-CM | POA: Insufficient documentation

## 2014-07-12 DIAGNOSIS — E160 Drug-induced hypoglycemia without coma: Secondary | ICD-10-CM

## 2014-07-12 DIAGNOSIS — E1140 Type 2 diabetes mellitus with diabetic neuropathy, unspecified: Secondary | ICD-10-CM | POA: Diagnosis not present

## 2014-07-12 DIAGNOSIS — T383X5A Adverse effect of insulin and oral hypoglycemic [antidiabetic] drugs, initial encounter: Secondary | ICD-10-CM

## 2014-07-12 LAB — BASIC METABOLIC PANEL
ANION GAP: 8 (ref 5–15)
BUN: 6 mg/dL (ref 6–20)
CO2: 29 mmol/L (ref 22–32)
Calcium: 8.7 mg/dL — ABNORMAL LOW (ref 8.9–10.3)
Chloride: 104 mmol/L (ref 101–111)
Creatinine, Ser: 0.8 mg/dL (ref 0.61–1.24)
GFR calc Af Amer: >60 mL/min (ref >60)
GFR calc non Af Amer: >60 mL/min (ref >60)
Glucose, Bld: 71 mg/dL (ref 65–99)
POTASSIUM: 3.4 mmol/L — AB (ref 3.5–5.1)
SODIUM: 141 mmol/L (ref 135–145)

## 2014-07-12 LAB — GLUCOSE, CAPILLARY
GLUCOSE-CAPILLARY: 127 mg/dL — AB (ref 65–99)
GLUCOSE-CAPILLARY: 146 mg/dL — AB (ref 65–99)
Glucose-Capillary: 27 mg/dL — CL (ref 65–99)
Glucose-Capillary: 93 mg/dL (ref 65–99)
Glucose-Capillary: 39 mg/dL — CL (ref 65–99)

## 2014-07-12 MED ORDER — DEXTROSE 50 % IV SOLN
INTRAVENOUS | Status: AC
  Administered 2014-07-12: 100 mL via INTRAVENOUS
  Filled 2014-07-12: qty 50

## 2014-07-12 MED ORDER — DEXTROSE 50 % IV SOLN
INTRAVENOUS | Status: AC
  Administered 2014-07-12: 100 mL via INTRAVENOUS
  Filled 2014-07-12: qty 100

## 2014-07-12 MED ORDER — DEXTROSE 50 % IV SOLN
100.0000 mL | Freq: Once | INTRAVENOUS | Status: AC
  Administered 2014-07-12: 100 mL via INTRAVENOUS

## 2014-07-12 MED ORDER — DEXTROSE 10 % IV SOLN
INTRAVENOUS | Status: DC
  Administered 2014-07-12: 23:00:00 via INTRAVENOUS

## 2014-07-12 NOTE — ED Notes (Signed)
MD made aware of CBG value of 39. MD acknowledged and received new orders.

## 2014-07-12 NOTE — ED Notes (Signed)
Pt [facility] to ER [facility] with c/o of insulin overdose coverage. EMS states pt regularly takes 5 units of Novolog before meals and 35 units of Levemir before bedtime. EMS states pt received 35 units of Novolog by accident after meal was provided by healthcare professional at Care Home. EMS states pt blood sugar levels have been steadily declining since time of administration.   321 @ 7:30 PM 237 @ 7:50 PM 168 @ 8:00 PM  93 @ 8:58 PM

## 2014-07-12 NOTE — ED Notes (Signed)
MD made aware of pt CBG of 27. MD acknowledged and received new orders for 2 amp of D50%.
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
## 2014-07-13 LAB — GLUCOSE, CAPILLARY
GLUCOSE-CAPILLARY: 107 mg/dL — AB (ref 65–99)
GLUCOSE-CAPILLARY: 147 mg/dL — AB (ref 65–99)
GLUCOSE-CAPILLARY: 158 mg/dL — AB (ref 65–99)
GLUCOSE-CAPILLARY: 163 mg/dL — AB (ref 65–99)
Glucose-Capillary: 154 mg/dL — ABNORMAL HIGH (ref 65–99)
Glucose-Capillary: 157 mg/dL — ABNORMAL HIGH (ref 65–99)
Glucose-Capillary: 169 mg/dL — ABNORMAL HIGH (ref 65–99)
Glucose-Capillary: 254 mg/dL — ABNORMAL HIGH (ref 65–99)

## 2014-07-13 MED ORDER — ACETAMINOPHEN 500 MG PO TABS
1000.0000 mg | ORAL_TABLET | Freq: Once | ORAL | Status: AC
  Administered 2014-07-13: 1000 mg via ORAL

## 2014-07-13 MED ORDER — ACETAMINOPHEN 500 MG PO TABS
ORAL_TABLET | ORAL | Status: AC
Start: 2014-07-13 — End: 2014-07-13
  Administered 2014-07-13: 1000 mg via ORAL
  Filled 2014-07-13: qty 2

## 2014-07-13 NOTE — ED Notes (Signed)
Patient with no complaints at this time. Respirations even and unlabored. Skin warm/dry. Discharge instructions reviewed with patient at this time. Patient given opportunity to voice concerns/ask questions. IV removed per policy and band-aid applied to site. Patient discharged at this time and [facility] Emergency Department[facility]

## 2014-07-13 NOTE — Discharge Instructions (Signed)
Blood Glucose Monitoring  Please make sure that you continue to check your blood sugar before each meal, and that you only use your prescribed insulin regimen. Be very careful not to use more insulin than prescribed.  Please make sure to check a blood sugar at 1 or 2 AM tomorrow in the early morning.  Should you have any blood sugars below 80, please take a sugar-containing beverage and call 911 to return to the ER for evaluation.  Monitoring your blood glucose (also know as blood sugar) helps you to manage your diabetes. It also helps you and your health care provider monitor your diabetes and determine how well your treatment plan is working. WHY SHOULD YOU MONITOR YOUR BLOOD GLUCOSE?  It can help you understand how food, exercise, and medicine affect your blood glucose.  It allows you to know what your blood glucose is at any given moment. You can quickly tell if you are having low blood glucose (hypoglycemia) or high blood glucose (hyperglycemia).  It can help you and your health care provider know how to adjust your medicines.  It can help you understand how to manage an illness or adjust medicine for exercise. WHEN SHOULD YOU TEST? Your health care provider will help you decide how often you should check your blood glucose. This may depend on the type of diabetes you have, your diabetes control, or the types of medicines you are taking. Be sure to write down all of your blood glucose readings so that this information can be reviewed with your health care provider. See below for examples of testing times that your health care provider may suggest. Type 1 Diabetes  Test 4 times a day if you are in good control, using an insulin pump, or perform multiple daily injections.  If your diabetes is not well controlled or if you are sick, you may need to monitor more often.  It is a good idea to also monitor:  Before and after exercise.  Between meals and 2 hours after a  meal.  Occasionally between 2:00 a.m. and 3:00 a.m. Type 2 Diabetes  It can vary with each person, but generally, if you are on insulin, test 4 times a day.  If you take medicines by mouth (orally), test 2 times a day.  If you are on a controlled diet, test once a day.  If your diabetes is not well controlled or if you are sick, you may need to monitor more often. HOW TO MONITOR YOUR BLOOD GLUCOSE Supplies Needed  Blood glucose meter.  Test strips for your meter. Each meter has its own strips. You must use the strips that go with your own meter.  A pricking needle (lancet).  A device that holds the lancet (lancing device).  A journal or log book to write down your results. Procedure  Wash your hands with soap and water. Alcohol is not preferred.  Prick the side of your finger (not the tip) with the lancet.  Gently milk the finger until a small drop of blood appears.  Follow the instructions that come with your meter for inserting the test strip, applying blood to the strip, and using your blood glucose meter. Other Areas to Get Blood for Testing Some meters allow you to use other areas of your body (other than your finger) to test your blood. These areas are called alternative sites. The most common alternative sites are:  The forearm.  The thigh.  The back area of the lower leg.  The palm of the hand. The blood flow in these areas is slower. Therefore, the blood glucose values you get may be delayed, and the numbers are different from what you would get from your fingers. Do not use alternative sites if you think you are having hypoglycemia. Your reading will not be accurate. Always use a finger if you are having hypoglycemia. Also, if you cannot feel your lows (hypoglycemia unawareness), always use your fingers for your blood glucose checks. ADDITIONAL TIPS FOR GLUCOSE MONITORING  Do not reuse lancets.  Always carry your supplies with you.  All blood glucose meters  have a 24-hour "hotline" number to call if you have questions or need help.  Adjust (calibrate) your blood glucose meter with a control solution after finishing a few boxes of strips. BLOOD GLUCOSE RECORD KEEPING It is a good idea to keep a daily record or log of your blood glucose readings. Most glucose meters, if not all, keep your glucose records stored in the meter. Some meters come with the ability to download your records to your home computer. Keeping a record of your blood glucose readings is especially helpful if you are wanting to look for patterns. Make notes to go along with the blood glucose readings because you might forget what happened at that exact time. Keeping good records helps you and your health care provider to work together to achieve good diabetes management.  Document Released: 02/03/2003 Document Revised: 06/17/2013 Document Reviewed: 06/25/2012 [facility] Patient Information 2015 [place], [place]. This information is not intended to replace advice given to you by your health care provider. Make sure you discuss any questions you have with your health care provider.  Low Blood Sugar Low blood sugar (hypoglycemia) means that the level of sugar in your blood is lower than it should be. Signs of low blood sugar include:  Getting sweaty.  Feeling hungry.  Feeling dizzy or weak.  Feeling sleepier than normal.  Feeling nervous.  Headaches.  Having a fast heartbeat. Low blood sugar can happen fast and can be an emergency. Your doctor can do tests to check your blood sugar level. You can have low blood sugar and not have diabetes. HOME CARE  Check your blood sugar as told by your doctor. If it is less than 70 mg/dl or as told by your doctor, take 1 of the following:  3 to 4 glucose tablets.   cup clear juice.   cup soda pop, not diet.  1 cup milk.  5 to 6 hard candies.  Recheck blood sugar after 15 minutes. Repeat until it is at the right level.  Eat a snack  if it is more than 1 hour until the next meal.  Only take medicine as told by your doctor.  Do not skip meals. Eat on time.  Do not drink alcohol except with meals.  Check your blood glucose before driving.  Check your blood glucose before and after exercise.  Always carry treatment with you, such as glucose pills.  Always wear a medical alert bracelet if you have diabetes. GET HELP RIGHT AWAY IF:   Your blood glucose goes below 70 mg/dl or as told by your doctor, and you:  Are confused.  Are not able to swallow.  Pass out (faint).  You cannot treat yourself. You may need someone to help you.  You have low blood sugar problems often.  You have problems from your medicines.  You are not feeling better after 3 to 4 days.  You  have vision changes. MAKE SURE YOU:   Understand these instructions.  Will watch this condition.  Will get help right away if you are not doing well or get worse. Document Released: 04/27/2009 Document Revised: 04/25/2011 Document Reviewed: 04/27/2009 [facility] Patient Information 2015 [place]. This information is not intended to replace advice given to you by your health care provider. Make sure you discuss any questions you have with your health care provider.

## 2014-07-13 NOTE — ED Notes (Signed)
Spoke with Harriett SineNancy, Med Tech on pt disposition status.

## 2014-07-13 NOTE — ED Provider Notes (Addendum)
Patient blood sugars are improving. We will stop patient's D10 fusion and continue to check blood sugars every 30 minutes to assure improvement. Should he require to go back on D10 or have additional glucose source for repeat hypoglycemia we will then have to admit the patient. However, should his glucose remained stable after coming off D10 90s and he would be okay to be discharged.  ----------------------------------------- 4:49 AM on 07/13/2014 -----------------------------------------  Patient awake and alert with stable vital signs. No distress. No complaints. Glucose has remained stable, he is been off D10 infusion for greater than 2 hours. Appears his symptoms and hyperglycemia have resolved.  Discussed with the patient that he may return to his normal insulin regimen after breakfast today. Facility was informed, and is already aware of the overdose which appears to up an iatrogenic. Will continue have his blood sugar checked 4 times a day, and continue on his previous insulin regimen taking great care to assure that appropriate dosing.  Return precautions discussed the patient including sweats, weakness, slurred speech, nausea, vomiting, or other concerns. I discussed should he develop any symptoms he should immediately check his blood sugar, and  he should take a sugar-containing beverage and call 911 to return to the ER.  Discharge. Condition improved, hypoglycemia resolved.  Sharyn CreamerMark Juelle Dickmann, MD 07/13/14 0451  RN, Luis, discussed with the patient's care facility who will continue his insulin regimen and blood sugar checks taking great care to assure appropriate dosing. In addition, the facility is unable to transport the patient.  Sharyn CreamerMark Edyn Popoca, MD 07/13/14 [phone]
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
## 2014-09-07 ENCOUNTER — Observation Stay
Admission: EM | Admit: 2014-09-07 | Discharge: 2014-09-09 | Payer: Medicaid Other | Attending: Internal Medicine | Admitting: Internal Medicine

## 2014-09-07 ENCOUNTER — Emergency Department: Payer: Medicaid Other

## 2014-09-07 ENCOUNTER — Encounter: Payer: Self-pay | Admitting: *Deleted

## 2014-09-07 DIAGNOSIS — Z89511 Acquired absence of right leg below knee: Secondary | ICD-10-CM | POA: Diagnosis not present

## 2014-09-07 DIAGNOSIS — E271 Primary adrenocortical insufficiency: Secondary | ICD-10-CM | POA: Diagnosis not present

## 2014-09-07 DIAGNOSIS — R001 Bradycardia, unspecified: Principal | ICD-10-CM | POA: Insufficient documentation

## 2014-09-07 DIAGNOSIS — J309 Allergic rhinitis, unspecified: Secondary | ICD-10-CM | POA: Diagnosis not present

## 2014-09-07 DIAGNOSIS — G8929 Other chronic pain: Secondary | ICD-10-CM | POA: Insufficient documentation

## 2014-09-07 DIAGNOSIS — Z833 Family history of diabetes mellitus: Secondary | ICD-10-CM | POA: Diagnosis not present

## 2014-09-07 DIAGNOSIS — E871 Hypo-osmolality and hyponatremia: Secondary | ICD-10-CM | POA: Insufficient documentation

## 2014-09-07 DIAGNOSIS — M549 Dorsalgia, unspecified: Secondary | ICD-10-CM | POA: Insufficient documentation

## 2014-09-07 DIAGNOSIS — R42 Dizziness and giddiness: Secondary | ICD-10-CM | POA: Insufficient documentation

## 2014-09-07 DIAGNOSIS — Z79899 Other long term (current) drug therapy: Secondary | ICD-10-CM | POA: Diagnosis not present

## 2014-09-07 DIAGNOSIS — R1010 Upper abdominal pain, unspecified: Secondary | ICD-10-CM | POA: Insufficient documentation

## 2014-09-07 DIAGNOSIS — R51 Headache: Secondary | ICD-10-CM | POA: Insufficient documentation

## 2014-09-07 DIAGNOSIS — H409 Unspecified glaucoma: Secondary | ICD-10-CM | POA: Insufficient documentation

## 2014-09-07 DIAGNOSIS — R112 Nausea with vomiting, unspecified: Secondary | ICD-10-CM | POA: Insufficient documentation

## 2014-09-07 DIAGNOSIS — F411 Generalized anxiety disorder: Secondary | ICD-10-CM | POA: Diagnosis not present

## 2014-09-07 DIAGNOSIS — F200 Paranoid schizophrenia: Secondary | ICD-10-CM | POA: Diagnosis not present

## 2014-09-07 DIAGNOSIS — Z89512 Acquired absence of left leg below knee: Secondary | ICD-10-CM | POA: Insufficient documentation

## 2014-09-07 DIAGNOSIS — G4733 Obstructive sleep apnea (adult) (pediatric): Secondary | ICD-10-CM | POA: Insufficient documentation

## 2014-09-07 DIAGNOSIS — I739 Peripheral vascular disease, unspecified: Secondary | ICD-10-CM | POA: Diagnosis not present

## 2014-09-07 DIAGNOSIS — I10 Essential (primary) hypertension: Secondary | ICD-10-CM | POA: Diagnosis not present

## 2014-09-07 DIAGNOSIS — M109 Gout, unspecified: Secondary | ICD-10-CM | POA: Diagnosis not present

## 2014-09-07 DIAGNOSIS — K219 Gastro-esophageal reflux disease without esophagitis: Secondary | ICD-10-CM | POA: Insufficient documentation

## 2014-09-07 DIAGNOSIS — E1143 Type 2 diabetes mellitus with diabetic autonomic (poly)neuropathy: Secondary | ICD-10-CM | POA: Insufficient documentation

## 2014-09-07 DIAGNOSIS — M869 Osteomyelitis, unspecified: Secondary | ICD-10-CM | POA: Insufficient documentation

## 2014-09-07 DIAGNOSIS — R1110 Vomiting, unspecified: Secondary | ICD-10-CM | POA: Diagnosis present

## 2014-09-07 DIAGNOSIS — F4310 Post-traumatic stress disorder, unspecified: Secondary | ICD-10-CM | POA: Insufficient documentation

## 2014-09-07 DIAGNOSIS — F319 Bipolar disorder, unspecified: Secondary | ICD-10-CM | POA: Diagnosis not present

## 2014-09-07 DIAGNOSIS — R197 Diarrhea, unspecified: Secondary | ICD-10-CM | POA: Insufficient documentation

## 2014-09-07 DIAGNOSIS — F329 Major depressive disorder, single episode, unspecified: Secondary | ICD-10-CM | POA: Insufficient documentation

## 2014-09-07 DIAGNOSIS — R1030 Lower abdominal pain, unspecified: Secondary | ICD-10-CM | POA: Insufficient documentation

## 2014-09-07 DIAGNOSIS — Z794 Long term (current) use of insulin: Secondary | ICD-10-CM | POA: Diagnosis not present

## 2014-09-07 DIAGNOSIS — Z87891 Personal history of nicotine dependence: Secondary | ICD-10-CM | POA: Insufficient documentation

## 2014-09-07 DIAGNOSIS — Z886 Allergy status to analgesic agent status: Secondary | ICD-10-CM | POA: Insufficient documentation

## 2014-09-07 DIAGNOSIS — E876 Hypokalemia: Secondary | ICD-10-CM | POA: Diagnosis present

## 2014-09-07 DIAGNOSIS — Z803 Family history of malignant neoplasm of breast: Secondary | ICD-10-CM | POA: Diagnosis not present

## 2014-09-07 DIAGNOSIS — Z801 Family history of malignant neoplasm of trachea, bronchus and lung: Secondary | ICD-10-CM | POA: Insufficient documentation

## 2014-09-07 DIAGNOSIS — E860 Dehydration: Secondary | ICD-10-CM | POA: Diagnosis not present

## 2014-09-07 DIAGNOSIS — J449 Chronic obstructive pulmonary disease, unspecified: Secondary | ICD-10-CM | POA: Insufficient documentation

## 2014-09-07 LAB — COMPREHENSIVE METABOLIC PANEL
ALBUMIN: 3.8 g/dL (ref 3.5–5.0)
ALK PHOS: 62 U/L (ref 38–126)
ALT: 16 U/L — AB (ref 17–63)
AST: 20 U/L (ref 15–41)
Anion gap: 9 (ref 5–15)
BUN: 7 mg/dL (ref 6–20)
CO2: 32 mmol/L (ref 22–32)
Calcium: 8.6 mg/dL — ABNORMAL LOW (ref 8.9–10.3)
Chloride: 101 mmol/L (ref 101–111)
Creatinine, Ser: 0.8 mg/dL (ref 0.61–1.24)
GFR calc non Af Amer: >60 mL/min (ref >60)
Glucose, Bld: 201 mg/dL — ABNORMAL HIGH (ref 65–99)
POTASSIUM: 2.9 mmol/L — AB (ref 3.5–5.1)
SODIUM: 142 mmol/L (ref 135–145)
TOTAL PROTEIN: 6.4 g/dL — AB (ref 6.5–8.1)
Total Bilirubin: 0.6 mg/dL (ref 0.3–1.2)

## 2014-09-07 LAB — URINALYSIS COMPLETE WITH MICROSCOPIC (ARMC ONLY)
Bacteria, UA: NONE SEEN
Bilirubin Urine: NEGATIVE
Glucose, UA: 50 mg/dL — AB
Hgb urine dipstick: NEGATIVE
KETONES UR: NEGATIVE mg/dL
Leukocytes, UA: NEGATIVE
Nitrite: NEGATIVE
PH: 7 (ref 5.0–8.0)
Protein, ur: NEGATIVE mg/dL
SPECIFIC GRAVITY, URINE: 1.009 (ref 1.005–1.030)
SQUAMOUS EPITHELIAL / LPF: NONE SEEN
WBC UA: NONE SEEN WBC/hpf (ref 0–5)

## 2014-09-07 LAB — CBC WITH DIFFERENTIAL/PLATELET
Basophils Absolute: 0 10*3/uL (ref 0–0.1)
Basophils Relative: 1 %
Eosinophils Absolute: 0.3 10*3/uL (ref 0–0.7)
Eosinophils Relative: 5 %
HCT: 40.7 % (ref 40.0–52.0)
Hemoglobin: 13.5 g/dL (ref 13.0–18.0)
LYMPHS ABS: 2.1 10*3/uL (ref 1.0–3.6)
LYMPHS PCT: 30 %
MCH: 27.5 pg (ref 26.0–34.0)
MCHC: 33.2 g/dL (ref 32.0–36.0)
MCV: 83 fL (ref 80.0–100.0)
MONOS PCT: 8 %
Monocytes Absolute: 0.5 10*3/uL (ref 0.2–1.0)
NEUTROS ABS: 4 10*3/uL (ref 1.4–6.5)
Neutrophils Relative %: 56 %
PLATELETS: 150 10*3/uL (ref 150–440)
RBC: 4.9 MIL/uL (ref 4.40–5.90)
RDW: 15.8 % — ABNORMAL HIGH (ref 11.5–14.5)
WBC: 7 10*3/uL (ref 3.8–10.6)

## 2014-09-07 LAB — TROPONIN I: Troponin I: <0.03 ng/mL (ref <0.031)

## 2014-09-07 LAB — LIPASE, BLOOD: Lipase: 13 U/L — ABNORMAL LOW (ref 22–51)

## 2014-09-07 MED ORDER — ALUM & MAG HYDROXIDE-SIMETH 200-200-20 MG/5ML PO SUSP: 30.0000 mL | Freq: Four times a day (QID) | ORAL | Status: DC | PRN

## 2014-09-07 MED ORDER — ACETAMINOPHEN 650 MG RE SUPP: 650.0000 mg | Freq: Four times a day (QID) | RECTAL | Status: DC | PRN

## 2014-09-07 MED ORDER — LURASIDONE HCL 80 MG PO TABS
80.0000 mg | ORAL_TABLET | Freq: Every day | ORAL | Status: DC
  Administered 2014-09-08 (×2): 80 mg via ORAL
  Filled 2014-09-07 (×2): qty 1

## 2014-09-07 MED ORDER — ACETAMINOPHEN 325 MG PO TABS: 650.0000 mg | ORAL_TABLET | Freq: Four times a day (QID) | ORAL | Status: DC | PRN

## 2014-09-07 MED ORDER — MELATONIN 3 MG PO TABS: 1.0000 | ORAL_TABLET | Freq: Every day | ORAL | Status: DC

## 2014-09-07 MED ORDER — PANTOPRAZOLE SODIUM 40 MG PO TBEC
40.0000 mg | DELAYED_RELEASE_TABLET | Freq: Every day | ORAL | Status: DC
  Administered 2014-09-08 – 2014-09-09 (×2): 40 mg via ORAL
  Filled 2014-09-07 (×2): qty 1

## 2014-09-07 MED ORDER — HYDRALAZINE HCL 25 MG PO TABS
25.0000 mg | ORAL_TABLET | Freq: Three times a day (TID) | ORAL | Status: DC
  Administered 2014-09-07 – 2014-09-09 (×6): 25 mg via ORAL
  Filled 2014-09-07 (×6): qty 1

## 2014-09-07 MED ORDER — LINAGLIPTIN 5 MG PO TABS
5.0000 mg | ORAL_TABLET | Freq: Every day | ORAL | Status: DC
  Administered 2014-09-08 – 2014-09-09 (×2): 5 mg via ORAL
  Filled 2014-09-07 (×2): qty 1

## 2014-09-07 MED ORDER — OXYCODONE HCL 5 MG PO TABS: 5.0000 mg | ORAL_TABLET | ORAL | Status: DC | PRN

## 2014-09-07 MED ORDER — POTASSIUM CHLORIDE CRYS ER 20 MEQ PO TBCR
40.0000 meq | EXTENDED_RELEASE_TABLET | Freq: Once | ORAL | Status: AC
  Administered 2014-09-07: 40 meq via ORAL
  Filled 2014-09-07: qty 2

## 2014-09-07 MED ORDER — LURASIDONE HCL 20 MG PO TABS
20.0000 mg | ORAL_TABLET | Freq: Every day | ORAL | Status: DC
Start: 2014-09-08 — End: 2014-09-09
  Administered 2014-09-08 (×2): 20 mg via ORAL
  Filled 2014-09-07 (×4): qty 1

## 2014-09-07 MED ORDER — LISINOPRIL 20 MG PO TABS
10.0000 mg | ORAL_TABLET | Freq: Once | ORAL | Status: AC
  Administered 2014-09-07: 10 mg via ORAL
  Filled 2014-09-07: qty 1

## 2014-09-07 MED ORDER — MAGNESIUM SULFATE 2 GM/50ML IV SOLN
2.0000 g | Freq: Once | INTRAVENOUS | Status: AC
  Administered 2014-09-07: 2 g via INTRAVENOUS
  Filled 2014-09-07: qty 50

## 2014-09-07 MED ORDER — CLONAZEPAM 0.5 MG PO TABS: 0.5000 mg | ORAL_TABLET | Freq: Three times a day (TID) | ORAL | Status: DC | PRN

## 2014-09-07 MED ORDER — ONDANSETRON HCL 4 MG PO TABS: 8.0000 mg | ORAL_TABLET | Freq: Four times a day (QID) | ORAL | Status: DC | PRN

## 2014-09-07 MED ORDER — FLUDROCORTISONE ACETATE 0.1 MG PO TABS
0.2000 mg | ORAL_TABLET | Freq: Every day | ORAL | Status: DC
  Administered 2014-09-08 – 2014-09-09 (×2): 0.2 mg via ORAL
  Filled 2014-09-07 (×2): qty 2

## 2014-09-07 MED ORDER — SODIUM CHLORIDE 0.9 % IV BOLUS (SEPSIS)
1000.0000 mL | Freq: Once | INTRAVENOUS | Status: AC
  Administered 2014-09-07: 1000 mL via INTRAVENOUS
  Filled 2014-09-07: qty 1000

## 2014-09-07 MED ORDER — INSULIN ASPART 100 UNIT/ML ~~LOC~~ SOLN
0.0000 [IU] | Freq: Three times a day (TID) | SUBCUTANEOUS | Status: DC
  Administered 2014-09-08: 7 [IU] via SUBCUTANEOUS
  Administered 2014-09-08: 3 [IU] via SUBCUTANEOUS
  Administered 2014-09-08: 2 [IU] via SUBCUTANEOUS
  Administered 2014-09-09 (×2): 3 [IU] via SUBCUTANEOUS
  Filled 2014-09-07: qty 2
  Filled 2014-09-07: qty 7
  Filled 2014-09-07 (×3): qty 3

## 2014-09-07 MED ORDER — PREGABALIN 75 MG PO CAPS
75.0000 mg | ORAL_CAPSULE | Freq: Three times a day (TID) | ORAL | Status: DC
  Administered 2014-09-08 – 2014-09-09 (×5): 75 mg via ORAL
  Filled 2014-09-07 (×5): qty 1

## 2014-09-07 MED ORDER — SODIUM CHLORIDE 0.9 % IV BOLUS (SEPSIS)
1000.0000 mL | Freq: Once | INTRAVENOUS | Status: AC
  Administered 2014-09-07: 1000 mL via INTRAVENOUS

## 2014-09-07 MED ORDER — PREDNISONE 10 MG PO TABS
10.0000 mg | ORAL_TABLET | Freq: Every day | ORAL | Status: DC
  Administered 2014-09-08 – 2014-09-09 (×2): 10 mg via ORAL
  Filled 2014-09-07 (×2): qty 1

## 2014-09-07 MED ORDER — LORATADINE 10 MG PO TABS
10.0000 mg | ORAL_TABLET | Freq: Every day | ORAL | Status: DC
  Administered 2014-09-08 – 2014-09-09 (×2): 10 mg via ORAL
  Filled 2014-09-07 (×2): qty 1

## 2014-09-07 MED ORDER — PROMETHAZINE HCL 25 MG PO TABS
25.0000 mg | ORAL_TABLET | Freq: Once | ORAL | Status: AC
  Administered 2014-09-07: 25 mg via ORAL
  Filled 2014-09-07: qty 1

## 2014-09-07 MED ORDER — FLUDROCORTISONE ACETATE 0.1 MG PO TABS
0.2000 mg | ORAL_TABLET | ORAL | Status: AC
  Administered 2014-09-07: 0.2 mg via ORAL
  Filled 2014-09-07: qty 2

## 2014-09-07 MED ORDER — LISINOPRIL 10 MG PO TABS
10.0000 mg | ORAL_TABLET | Freq: Every day | ORAL | Status: DC
  Administered 2014-09-08 – 2014-09-09 (×2): 10 mg via ORAL
  Filled 2014-09-07 (×2): qty 1

## 2014-09-07 MED ORDER — PREDNISONE 10 MG PO TABS
10.0000 mg | ORAL_TABLET | Freq: Once | ORAL | Status: AC
  Administered 2014-09-07: 10 mg via ORAL
  Filled 2014-09-07: qty 1

## 2014-09-07 MED ORDER — ENOXAPARIN SODIUM 40 MG/0.4ML ~~LOC~~ SOLN
40.0000 mg | SUBCUTANEOUS | Status: DC
  Administered 2014-09-08 (×2): 40 mg via SUBCUTANEOUS
  Filled 2014-09-07 (×2): qty 0.4

## 2014-09-07 MED ORDER — FLUOXETINE HCL 20 MG PO CAPS
60.0000 mg | ORAL_CAPSULE | Freq: Every day | ORAL | Status: DC
  Administered 2014-09-08 – 2014-09-09 (×2): 60 mg via ORAL
  Filled 2014-09-07 (×2): qty 3

## 2014-09-07 MED ORDER — ONDANSETRON HCL 4 MG PO TABS: 4.0000 mg | ORAL_TABLET | Freq: Four times a day (QID) | ORAL | Status: DC | PRN

## 2014-09-07 MED ORDER — ATROPINE SULFATE 1 MG/ML IJ SOLN: 0.4000 mg | Freq: Once | INTRAMUSCULAR | Status: DC

## 2014-09-07 MED ORDER — ONDANSETRON 8 MG PO TBDP
8.0000 mg | ORAL_TABLET | Freq: Once | ORAL | Status: AC
  Administered 2014-09-07: 8 mg via ORAL
  Filled 2014-09-07: qty 1

## 2014-09-07 MED ORDER — POTASSIUM CHLORIDE 20 MEQ PO PACK
40.0000 meq | PACK | Freq: Once | ORAL | Status: AC
  Administered 2014-09-08: 40 meq via ORAL
  Filled 2014-09-07: qty 2

## 2014-09-07 MED ORDER — POLYETHYLENE GLYCOL 3350 17 G PO PACK: 17.0000 g | PACK | Freq: Every day | ORAL | Status: DC | PRN

## 2014-09-07 MED ORDER — ONDANSETRON HCL 4 MG/2ML IJ SOLN: 4.0000 mg | Freq: Four times a day (QID) | INTRAMUSCULAR | Status: DC | PRN

## 2014-09-07 MED ORDER — POTASSIUM CHLORIDE 10 MEQ/100ML IV SOLN
10.0000 meq | Freq: Once | INTRAVENOUS | Status: AC
  Administered 2014-09-07: 10 meq via INTRAVENOUS
  Filled 2014-09-07 (×2): qty 100

## 2014-09-07 MED ORDER — HYDRALAZINE HCL 20 MG/ML IJ SOLN: 5.0000 mg | Freq: Four times a day (QID) | INTRAMUSCULAR | Status: DC | PRN

## 2014-09-07 MED ORDER — ATROPINE SULFATE 0.1 MG/ML IJ SOLN
0.4000 mg | Freq: Once | INTRAMUSCULAR | Status: DC
  Filled 2014-09-07: qty 10

## 2014-09-07 MED ORDER — FLUTICASONE PROPIONATE 50 MCG/ACT NA SUSP
1.0000 | Freq: Every day | NASAL | Status: DC
  Administered 2014-09-08 – 2014-09-09 (×2): 1 via NASAL
  Filled 2014-09-07: qty 16
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
## 2014-09-07 NOTE — ED Notes (Signed)
Pt from [facility], per EMS and staff at home, pt is refusing to eat, all medications, c/o lower abd pain today.  Pt also states he loss of control of his bowels during the night.

## 2014-09-08 ENCOUNTER — Encounter: Payer: Self-pay | Admitting: *Deleted

## 2014-09-08 LAB — BASIC METABOLIC PANEL
Anion gap: 8 (ref 5–15)
BUN: 7 mg/dL (ref 6–20)
CO2: 31 mmol/L (ref 22–32)
CREATININE: 0.67 mg/dL (ref 0.61–1.24)
Calcium: 8.2 mg/dL — ABNORMAL LOW (ref 8.9–10.3)
Chloride: 102 mmol/L (ref 101–111)
GFR calc Af Amer: >60 mL/min (ref >60)
GFR calc non Af Amer: >60 mL/min (ref >60)
Glucose, Bld: 241 mg/dL — ABNORMAL HIGH (ref 65–99)
POTASSIUM: 3.5 mmol/L (ref 3.5–5.1)
Sodium: 141 mmol/L (ref 135–145)

## 2014-09-08 LAB — CBC
HCT: 37.6 % — ABNORMAL LOW (ref 40.0–52.0)
HEMOGLOBIN: 12.5 g/dL — AB (ref 13.0–18.0)
MCH: 27.5 pg (ref 26.0–34.0)
MCHC: 33.2 g/dL (ref 32.0–36.0)
MCV: 82.9 fL (ref 80.0–100.0)
Platelets: 137 10*3/uL — ABNORMAL LOW (ref 150–440)
RBC: 4.54 MIL/uL (ref 4.40–5.90)
RDW: 15.9 % — ABNORMAL HIGH (ref 11.5–14.5)
WBC: 7 10*3/uL (ref 3.8–10.6)

## 2014-09-08 LAB — GLUCOSE, CAPILLARY
GLUCOSE-CAPILLARY: 231 mg/dL — AB (ref 65–99)
Glucose-Capillary: 326 mg/dL — ABNORMAL HIGH (ref 65–99)
Glucose-Capillary: 183 mg/dL — ABNORMAL HIGH (ref 65–99)
Glucose-Capillary: 225 mg/dL — ABNORMAL HIGH (ref 65–99)

## 2014-09-08 LAB — MAGNESIUM
MAGNESIUM: 1.4 mg/dL — AB (ref 1.7–2.4)
Magnesium: 1.7 mg/dL (ref 1.7–2.4)

## 2014-09-08 LAB — TROPONIN I: Troponin I: 0.03 ng/mL (ref <0.031)

## 2014-09-08 LAB — TSH: TSH: 0.725 u[IU]/mL (ref 0.350–4.500)

## 2014-09-08 NOTE — Clinical Social Work Note (Signed)
Clinical Social Work Assessment  Patient Details  Name: [name] MRN: [id number] Date of Birth: 1955-09-16  Date of referral:  09/08/14               Reason for consult:   (pt is from a family care home)                Permission sought to share information with:  Facility Industrial/product designer granted to share information::  Yes, Verbal Permission Granted  Name::     [facility] (Not sure on the spelling)  Agency::     Relationship::     Contact Information:     Housing/Transportation Living arrangements for the past 2 months:  Group Home Source of Information:  Patient, Medical Team Patient Interpreter Needed:  None Criminal Activity/Legal Involvement Pertinent to Current Situation/Hospitalization:  No - Comment as needed Significant Relationships:  None Lives with:  Facility Resident Do you feel safe going back to the place where you live?  Yes Need for family participation in patient care:  No (Coment)  Care giving concerns:  Pt stated that he was in agreement with DC back to group home.   Social Worker assessment / plan:  CSW spoke to pt.  He was Ox4 during assessment.  He stated that he had been at this family care home since April.  He stated that he was in agreement with retuning back to this family care home once medically stable.  He sated that he was able to get around the [facility] with a wheelchair bound.  Pt is a double amputee.  He stated that he did have family, however they were out of state and he did not want CSW to contact them.  CSW was given permission to speak to the facility.  CSW did attempt to call the home # listed in pt's facesheet, however there was no answer.  CSW also attempted another number listed on a [facility]F [facility] resource however there was still no answer.  CSW will attempt to contact this facility again later today.    Employment status:  Disabled (Comment on whether or not currently receiving Disability) Insurance information:   Medicaid In [place] PT Recommendations:    Information / Referral to community resources:     Patient/Family's Response to care:  Pt was in agreeement with DC back to [facility]   Patient/Family's Understanding of and Emotional Response to Diagnosis, Current Treatment, and Prognosis:  Pt verbalized his understanding of this DC plan and was in agreement with this DC.    Emotional Assessment Appearance:    Attitude/Demeanor/Rapport:   (pt seemed tired but he was polite.) Affect (typically observed):   (tired) Orientation:  Oriented to Self, Oriented to Place, Oriented to  Time, Oriented to Situation Alcohol / Substance use:  Alcohol Use (Per H&P pt has not used alcohol since the 80's) Psych involvement (Current and /or in the community):  No (Comment)  Discharge Needs  Concerns to be addressed:  Care Coordination Readmission within the last 30 days:  No Current discharge risk:  None Barriers to Discharge:  No Barriers Identified   Chauncy Passy, LCSW 09/08/2014, 11:54 AM

## 2014-09-08 NOTE — Progress Notes (Signed)
PHARMACIST - PHYSICIAN ORDER COMMUNICATION  CONCERNING: P&T Medication Policy on Herbal Medications  DESCRIPTION:  This patient's order for:  Melatonin  has been noted.  This product(s) is classified as an "herbal" or natural product. Due to a lack of definitive safety studies or FDA approval, nonstandard manufacturing practices, plus the potential risk of unknown drug-drug interactions while on inpatient medications, the Pharmacy and Therapeutics Committee does not permit the use of "herbal" or natural products of this type within [facility].   ACTION TAKEN: The pharmacy department is unable to verify this order at this time and your patient has been informed of this safety policy. Please reevaluate patient's clinical condition at discharge and address if the herbal or natural product(s) should be resumed at that time.  Bari Mantis PharmD Clinical Pharmacist 09/08/2014

## 2014-09-08 NOTE — Clinical Social Work Note (Signed)
CSW is still unable to contact pt's group home.  CSW will attempt again tomorrow.  FL2 on pt's chart for signature.

## 2014-09-08 NOTE — Plan of Care (Signed)
Problem: Phase II Progression Outcomes Goal: Other Phase II Outcomes/Goals Outcome: Completed/Met Date Met:  09/08/14 Pt is alert and oriented x 4, denies pain, on room air, vital signs stable, good appetite, taking medications, resting in bed quietly inbetween shifts, withdrawn, receiving insulin for diabetes, using urinal, good urine output.
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
## 2014-09-08 NOTE — Progress Notes (Signed)
[facility] [place] [facility]   PATIENT NAME: [name]    MR#:  [id number]  DATE OF BIRTH:  December 11, 1955  SUBJECTIVE: Patient is admitted yesterday secondary to nausea, abdominal discomfort, bradycardia. His vomiting with resolved abdominal discomfort is improved still bradycardic but asymptomatic.   CHIEF COMPLAINT:   Chief Complaint  Patient presents with  . Abdominal Pain    REVIEW OF SYSTEMS:    Review of Systems  Constitutional: Negative for fever and chills.  HENT: Negative for hearing loss.   Eyes: Negative for blurred vision, double vision and photophobia.  Respiratory: Negative for cough, hemoptysis and shortness of breath.   Cardiovascular: Negative for palpitations, orthopnea and leg swelling.  Gastrointestinal: Positive for abdominal pain. Negative for heartburn, nausea, vomiting, diarrhea and constipation.  Genitourinary: Negative for dysuria and urgency.  Musculoskeletal: Negative for myalgias and neck pain.  Skin: Negative for rash.  Neurological: Negative for dizziness, focal weakness, seizures, weakness and headaches.  Psychiatric/Behavioral: Negative for memory loss. The patient does not have insomnia.     Nutrition:  Tolerating Diet: Tolerating PT:      DRUG ALLERGIES:   Allergies  Allergen Reactions  . Aspirin Nausea And Vomiting and Other (See Comments)    High doses only    VITALS:  Blood pressure 173/74, pulse 40, temperature 97.2 F (36.2 C), temperature source Oral, resp. rate 20, height  (1.753 m), weight 86.456 kg (190 lb 9.6 oz), SpO2 95 %.  PHYSICAL EXAMINATION:   Physical Exam  GENERAL:  59 y.o.-year-old patient lying in the bed with no acute distress.  EYES: Pupils equal, round, reactive to light and accommodation. No scleral icterus. Extraocular muscles intact.  HEENT: Head atraumatic, normocephalic. Oropharynx and nasopharynx clear.  NECK:  Supple, no jugular venous distention. No  thyroid enlargement, no tenderness.  LUNGS: Normal breath sounds bilaterally, no wheezing, rales,rhonchi or crepitation. No use of accessory muscles of respiration.  CARDIOVASCULAR: S1, S2 normal. No murmurs, rubs, or gallops.  ABDOMEN: Soft, nontender, nondistended. Bowel sounds present. No organomegaly or mass.  EXTREMITIES: No pedal edema, cyanosis, or clubbing.  NEUROLOGIC: Cranial nerves II through XII are intact. Muscle strength 5/5 in all extremities. Sensation intact. Gait not checked.  PSYCHIATRIC: The patient is alert and oriented x 3.  SKIN: No obvious rash, lesion, or ulcer.    LABORATORY PANEL:   CBC  Recent Labs Lab 09/08/14 0532  WBC 7.0  HGB 12.5*  HCT 37.6*  PLT 137*   ------------------------------------------------------------------------------------------------------------------  Chemistries   Recent Labs Lab 09/07/14 1734 09/08/14 0532  NA 142 141  K 2.9* 3.5  CL 101 102  CO2 32 31  GLUCOSE 201* 241*  BUN 7 7  CREATININE 0.80 0.67  CALCIUM 8.6* 8.2*  MG 1.4* 1.7  AST 20  --   ALT 16*  --   ALKPHOS 62  --   BILITOT 0.6  --    ------------------------------------------------------------------------------------------------------------------  Cardiac Enzymes  Recent Labs Lab 09/08/14 0532  TROPONINI <0.03   ------------------------------------------------------------------------------------------------------------------  RADIOLOGY:  Dg Abd 2 Views  09/07/2014   CLINICAL DATA:  The nausea with diarrhea since yesterday. Mid and lower abdominal pain. Diabetes.  EXAM: ABDOMEN - 2 VIEW  COMPARISON:  CT abdomen and pelvis 09/06/2013 and abdominal radiograph 09/23/2011  FINDINGS: No intraperitoneal free air or air-fluid levels are seen. There is a moderate amount of colonic stool. There is no small or large bowel dilatation. Calcifications in the pelvis are compatible with phleboliths. Visualized lung  bases are clear. No acute osseous abnormality is  seen.  IMPRESSION: Nonobstructed bowel-gas pattern.  Moderate amount of colonic stool.   Electronically Signed   By: Sebastian Ache   On: 09/07/2014 17:29     ASSESSMENT AND PLAN:   Active Problems:   Sinus bradycardia  #1.Nausea,vomitng,likley due to gastritis;resolved 2.bradycardia;asymptomatic;cardiology consult is pending 3. type 2 diabetes mellitus  4. Addison's disease 5. peripheral vascular disease with bilateral lower extremity amputation  6.hypokalemia treated     All the records are reviewed and case discussed with Care Management/Social Workerr. Management plans discussed with the patient, family and they are in agreement.  CODE STATUS: full  TOTAL TIME TAKING CARE OF THIS PATIENT: 35 minutes.   POSSIBLE D/C IN 1-2 DAYS, DEPENDING ON CLINICAL CONDITION.   Katha Hamming M.D on 09/08/2014 at 12:14 PM  Between 7am to 6pm - Pager - [phone]  After 6pm go to [email or web address] - password [email or web address] [facility]  [place] [facility]  Office  [phone]  CC: Primary care physician; Preston Fleeting, MD

## 2014-09-08 NOTE — Care Management (Signed)
Patient present from a family care home setting- [facility].  Made referral to clinical social work.  Patient is admitted under observation with symptomatic bradycardia.  Cardiology consult is in progress.
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
## 2014-09-09 LAB — GLUCOSE, CAPILLARY
Glucose-Capillary: 225 mg/dL — ABNORMAL HIGH (ref 65–99)
Glucose-Capillary: 240 mg/dL — ABNORMAL HIGH (ref 65–99)

## 2014-09-09 MED ORDER — HYDRALAZINE HCL 25 MG PO TABS: 25.0000 mg | ORAL_TABLET | Freq: Three times a day (TID) | ORAL | Status: DC
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
## 2014-09-09 NOTE — Progress Notes (Signed)
Patient remains bradycardic and tolerating diet and fluids,no pain

## 2014-09-09 NOTE — Progress Notes (Signed)
Subjective:  The patient denies syncope no blackout spells no chest pain no nausea vomiting feels reasonably well  Objective:  Vital Signs in the last 24 hours: Temp:  [98.2 F (36.8 C)-98.4 F (36.9 C)] 98.4 F (36.9 C) (07/26 1123) Pulse Rate:  [44-57] 45 (07/26 1123) Resp:  [16-19] 16 (07/26 1123) BP: (135-178)/(70-78) 178/72 mmHg (07/26 1123) SpO2:  [93 %-98 %] 93 % (07/26 1123)  Intake/Output from previous day: 07/25 0701 - 07/26 0700 In: 850 [P.O.:850] Out: 1975 [Urine:1975] Intake/Output from this shift: Total I/O In: 240 [P.O.:240] Out: 700 [Urine:700]  Physical Exam: General appearance: slowed mentation Neck: no adenopathy, no carotid bruit, no JVD, supple, symmetrical, trachea midline and thyroid not enlarged, symmetric, no tenderness/mass/nodules Lungs: clear to auscultation bilaterally Heart: Bradycardic but regular systolic murmur2/6 sem Abdomen: soft, non-tender; bowel sounds normal; no masses,  no organomegaly Extremities: extremities normal, atraumatic, no cyanosis or edema Pulses: 2+ and symmetric Skin: Skin color, texture, turgor normal. No rashes or lesions Neurologic: Mental status: alertness: lethargic  Lab Results:  Recent Labs  09/07/14 1734 09/08/14 0532  WBC 7.0 7.0  HGB 13.5 12.5*  PLT 150 137*    Recent Labs  09/07/14 1734 09/08/14 0532  NA 142 141  K 2.9* 3.5  CL 101 102  CO2 32 31  GLUCOSE 201* 241*  BUN 7 7  CREATININE 0.80 0.67    Recent Labs  09/08/14 0532 09/08/14 1146  TROPONINI <0.03 0.03   Hepatic Function Panel  Recent Labs  09/07/14 1734  PROT 6.4*  ALBUMIN 3.8  AST 20  ALT 16*  ALKPHOS 62  BILITOT 0.6   No results for input(s): CHOL in the last 72 hours. No results for input(s): PROTIME in the last 72 hours.  Imaging: Imaging results have been reviewed  Cardiac Studies:  Assessment/Plan:  Arrhythmia   bradycardia  sinus  schizophrenia  PTSD  depression  paranoid schizophrenia  GERD  diabetes type 2  hypertension . PLAN  recommend medical therapy conservatively  blood pressure control with hydralazine lisinopril  GERD continue Protonix  anxiety continue Klonopin Prozac  sinusitis continue Claritin  continue diabetic control  no clear indication for permanent pacemaker at this point  consider 24 Holter as an outpatient  follow-up with Cardiology 1-2 weeks       Mechele Kittleson D. 09/09/2014, 12:18 PM

## 2014-09-09 NOTE — Plan of Care (Signed)
Problem: Discharge Progression Outcomes Goal: Other Discharge Outcomes/Goals Outcome: Progressing Pt is alert and oriented x 4, denies pain, good appetite, on room air, blood pressure elevated but receiving hydralazine tid, Sb on tele but asymptomatic, pt has f/u appointment scheduled to see Dr. Juliann Pares. No bm throughout shift. Pt recommended to f/u with pcp in 1-2 weeks. DNR and rx placed in d/c packet. Pt reports understanding d/c instructions, uneventful shift.

## 2014-09-14 ENCOUNTER — Encounter: Payer: Self-pay | Admitting: Emergency Medicine

## 2014-09-14 ENCOUNTER — Emergency Department
Admission: EM | Admit: 2014-09-14 | Discharge: 2014-09-14 | Disposition: A | Discharge status: Home / Self Care | Payer: Medicaid Other | Attending: Emergency Medicine | Admitting: Emergency Medicine

## 2014-09-14 DIAGNOSIS — E1143 Type 2 diabetes mellitus with diabetic autonomic (poly)neuropathy: Secondary | ICD-10-CM | POA: Insufficient documentation

## 2014-09-14 DIAGNOSIS — Z7952 Long term (current) use of systemic steroids: Secondary | ICD-10-CM | POA: Insufficient documentation

## 2014-09-14 DIAGNOSIS — I10 Essential (primary) hypertension: Secondary | ICD-10-CM | POA: Insufficient documentation

## 2014-09-14 DIAGNOSIS — Z87891 Personal history of nicotine dependence: Secondary | ICD-10-CM | POA: Insufficient documentation

## 2014-09-14 DIAGNOSIS — R0602 Shortness of breath: Secondary | ICD-10-CM | POA: Insufficient documentation

## 2014-09-14 DIAGNOSIS — R079 Chest pain, unspecified: Secondary | ICD-10-CM | POA: Diagnosis present

## 2014-09-14 DIAGNOSIS — Z7951 Long term (current) use of inhaled steroids: Secondary | ICD-10-CM | POA: Diagnosis not present

## 2014-09-14 DIAGNOSIS — Z79899 Other long term (current) drug therapy: Secondary | ICD-10-CM | POA: Diagnosis not present

## 2014-09-14 LAB — COMPREHENSIVE METABOLIC PANEL
ALBUMIN: 3.5 g/dL (ref 3.5–5.0)
ALT: 13 U/L — ABNORMAL LOW (ref 17–63)
ANION GAP: 9 (ref 5–15)
AST: 14 U/L — AB (ref 15–41)
Alkaline Phosphatase: 62 U/L (ref 38–126)
BILIRUBIN TOTAL: 0.4 mg/dL (ref 0.3–1.2)
BUN: 7 mg/dL (ref 6–20)
CHLORIDE: 100 mmol/L — AB (ref 101–111)
CO2: 29 mmol/L (ref 22–32)
Calcium: 8.5 mg/dL — ABNORMAL LOW (ref 8.9–10.3)
Creatinine, Ser: 0.79 mg/dL (ref 0.61–1.24)
GFR calc Af Amer: >60 mL/min (ref >60)
Glucose, Bld: 234 mg/dL — ABNORMAL HIGH (ref 65–99)
POTASSIUM: 3.2 mmol/L — AB (ref 3.5–5.1)
Sodium: 138 mmol/L (ref 135–145)
Total Protein: 6.1 g/dL — ABNORMAL LOW (ref 6.5–8.1)

## 2014-09-14 LAB — FIBRIN DERIVATIVES D-DIMER (ARMC ONLY): Fibrin derivatives D-dimer (ARMC): 424.89 (ref 0–499)

## 2014-09-14 LAB — CBC WITH DIFFERENTIAL/PLATELET
BASOS ABS: 0 10*3/uL (ref 0–0.1)
BASOS PCT: 0 %
EOS ABS: 0.2 10*3/uL (ref 0–0.7)
Eosinophils Relative: 4 %
HCT: 38.4 % — ABNORMAL LOW (ref 40.0–52.0)
Hemoglobin: 12.8 g/dL — ABNORMAL LOW (ref 13.0–18.0)
Lymphocytes Relative: 35 %
Lymphs Abs: 2.4 10*3/uL (ref 1.0–3.6)
MCH: 27.9 pg (ref 26.0–34.0)
MCHC: 33.4 g/dL (ref 32.0–36.0)
MCV: 83.3 fL (ref 80.0–100.0)
MONO ABS: 0.6 10*3/uL (ref 0.2–1.0)
Monocytes Relative: 9 %
NEUTROS ABS: 3.5 10*3/uL (ref 1.4–6.5)
Neutrophils Relative %: 52 %
Platelets: 158 10*3/uL (ref 150–440)
RBC: 4.61 MIL/uL (ref 4.40–5.90)
RDW: 16.1 % — AB (ref 11.5–14.5)
WBC: 6.7 10*3/uL (ref 3.8–10.6)

## 2014-09-14 LAB — TROPONIN I

## 2014-09-14 LAB — LIPASE, BLOOD: Lipase: 17 U/L — ABNORMAL LOW (ref 22–51)

## 2014-09-14 MED ORDER — FAMOTIDINE 20 MG PO TABS: 40.0000 mg | ORAL_TABLET | Freq: Once | ORAL | Status: DC

## 2014-09-14 MED ORDER — SODIUM CHLORIDE 0.9 % IV BOLUS (SEPSIS): 1000.0000 mL | Freq: Once | INTRAVENOUS | Status: DC

## 2014-09-14 MED ORDER — ASPIRIN 81 MG PO CHEW: 324.0000 mg | CHEWABLE_TABLET | Freq: Once | ORAL | Status: DC

## 2014-09-14 MED ORDER — NITROGLYCERIN 0.4 MG SL SUBL: 0.4000 mg | SUBLINGUAL_TABLET | SUBLINGUAL | Status: DC | PRN

## 2014-09-14 NOTE — ED Notes (Signed)
EMS pt from [facility] with c/o chest pain that started last night; pt with little eye contact during triage/assessment by MD; alert and oriented x 3; talking in complete coherent sentences

## 2014-09-14 NOTE — Discharge Instructions (Signed)
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
## 2014-09-14 NOTE — ED Notes (Signed)
EMS reports BP 192/88; pt took 1 SL nitro with took his pain from 7-8/10 to 6/10

## 2014-09-14 NOTE — ED Notes (Signed)
Pt c/o pain to the center of his chest since last night; reports pain as sharp to this nurse, reported heaviness and pressure to EMS; pt recently admitted to [facility] for 2 nights for bradycardia;

## 2014-09-29 ENCOUNTER — Emergency Department: Payer: Medicaid Other

## 2014-09-29 ENCOUNTER — Emergency Department
Admission: EM | Admit: 2014-09-29 | Discharge: 2014-10-01 | Payer: Medicaid Other | Attending: Emergency Medicine | Admitting: Emergency Medicine

## 2014-09-29 ENCOUNTER — Encounter: Payer: Self-pay | Admitting: Emergency Medicine

## 2014-09-29 DIAGNOSIS — R739 Hyperglycemia, unspecified: Secondary | ICD-10-CM

## 2014-09-29 DIAGNOSIS — Z794 Long term (current) use of insulin: Secondary | ICD-10-CM | POA: Insufficient documentation

## 2014-09-29 DIAGNOSIS — E1140 Type 2 diabetes mellitus with diabetic neuropathy, unspecified: Secondary | ICD-10-CM | POA: Insufficient documentation

## 2014-09-29 DIAGNOSIS — Z89512 Acquired absence of left leg below knee: Secondary | ICD-10-CM | POA: Insufficient documentation

## 2014-09-29 DIAGNOSIS — R45851 Suicidal ideations: Secondary | ICD-10-CM | POA: Diagnosis present

## 2014-09-29 DIAGNOSIS — Z7951 Long term (current) use of inhaled steroids: Secondary | ICD-10-CM | POA: Insufficient documentation

## 2014-09-29 DIAGNOSIS — Z87891 Personal history of nicotine dependence: Secondary | ICD-10-CM | POA: Insufficient documentation

## 2014-09-29 DIAGNOSIS — E1165 Type 2 diabetes mellitus with hyperglycemia: Secondary | ICD-10-CM | POA: Diagnosis not present

## 2014-09-29 DIAGNOSIS — I10 Essential (primary) hypertension: Secondary | ICD-10-CM

## 2014-09-29 DIAGNOSIS — Z7952 Long term (current) use of systemic steroids: Secondary | ICD-10-CM | POA: Diagnosis not present

## 2014-09-29 DIAGNOSIS — E876 Hypokalemia: Secondary | ICD-10-CM | POA: Insufficient documentation

## 2014-09-29 DIAGNOSIS — Z89511 Acquired absence of right leg below knee: Secondary | ICD-10-CM | POA: Insufficient documentation

## 2014-09-29 LAB — COMPREHENSIVE METABOLIC PANEL
ALBUMIN: 4.2 g/dL (ref 3.5–5.0)
ALK PHOS: 71 U/L (ref 38–126)
ALT: 21 U/L (ref 17–63)
AST: 30 U/L (ref 15–41)
Anion gap: 11 (ref 5–15)
BILIRUBIN TOTAL: 0.4 mg/dL (ref 0.3–1.2)
BUN: 6 mg/dL (ref 6–20)
CALCIUM: 8.8 mg/dL — AB (ref 8.9–10.3)
CO2: 28 mmol/L (ref 22–32)
Chloride: 94 mmol/L — ABNORMAL LOW (ref 101–111)
Creatinine, Ser: 0.86 mg/dL (ref 0.61–1.24)
GFR calc Af Amer: >60 mL/min (ref >60)
GFR calc non Af Amer: >60 mL/min (ref >60)
GLUCOSE: 371 mg/dL — AB (ref 65–99)
Potassium: 2.9 mmol/L — CL (ref 3.5–5.1)
Sodium: 133 mmol/L — ABNORMAL LOW (ref 135–145)
TOTAL PROTEIN: 7 g/dL (ref 6.5–8.1)

## 2014-09-29 LAB — SALICYLATE LEVEL: Salicylate Lvl: <4 mg/dL (ref 2.8–30.0)

## 2014-09-29 LAB — CBC
HEMATOCRIT: 41.2 % (ref 40.0–52.0)
Hemoglobin: 13.4 g/dL (ref 13.0–18.0)
MCH: 27.3 pg (ref 26.0–34.0)
MCHC: 32.7 g/dL (ref 32.0–36.0)
MCV: 83.5 fL (ref 80.0–100.0)
Platelets: 222 10*3/uL (ref 150–440)
RBC: 4.93 MIL/uL (ref 4.40–5.90)
RDW: 15.5 % — AB (ref 11.5–14.5)
WBC: 7.3 10*3/uL (ref 3.8–10.6)

## 2014-09-29 LAB — URINE DRUG SCREEN, QUALITATIVE (ARMC ONLY)
Amphetamines, Ur Screen: NOT DETECTED
BARBITURATES, UR SCREEN: NOT DETECTED
Benzodiazepine, Ur Scrn: NOT DETECTED
CANNABINOID 50 NG, UR ~~LOC~~: NOT DETECTED
COCAINE METABOLITE, UR ~~LOC~~: NOT DETECTED
MDMA (ECSTASY) UR SCREEN: NOT DETECTED
Methadone Scn, Ur: NOT DETECTED
OPIATE, UR SCREEN: NOT DETECTED
Phencyclidine (PCP) Ur S: NOT DETECTED
TRICYCLIC, UR SCREEN: NOT DETECTED

## 2014-09-29 LAB — ETHANOL: Alcohol, Ethyl (B): <5 mg/dL (ref <5)

## 2014-09-29 LAB — ACETAMINOPHEN LEVEL: Acetaminophen (Tylenol), Serum: <10 ug/mL — ABNORMAL LOW (ref 10–30)

## 2014-09-29 LAB — GLUCOSE, CAPILLARY: Glucose-Capillary: 278 mg/dL — ABNORMAL HIGH (ref 65–99)

## 2014-09-29 MED ORDER — INSULIN DETEMIR 100 UNIT/ML ~~LOC~~ SOLN
15.0000 [IU] | Freq: Once | SUBCUTANEOUS | Status: AC
  Administered 2014-09-29: 15 [IU] via SUBCUTANEOUS
  Filled 2014-09-29: qty 0.15

## 2014-09-29 MED ORDER — SODIUM CHLORIDE 0.9 % IV BOLUS (SEPSIS)
1000.0000 mL | Freq: Once | INTRAVENOUS | Status: AC
  Administered 2014-09-29: 1000 mL via INTRAVENOUS

## 2014-09-29 MED ORDER — HYDRALAZINE HCL 25 MG PO TABS
25.0000 mg | ORAL_TABLET | Freq: Once | ORAL | Status: AC
  Administered 2014-09-29: 25 mg via ORAL
  Filled 2014-09-29: qty 1

## 2014-09-29 MED ORDER — INSULIN ASPART 100 UNIT/ML ~~LOC~~ SOLN
10.0000 [IU] | Freq: Once | SUBCUTANEOUS | Status: AC
  Administered 2014-09-29: 10 [IU] via SUBCUTANEOUS
  Filled 2014-09-29: qty 10

## 2014-09-29 MED ORDER — POTASSIUM CHLORIDE CRYS ER 20 MEQ PO TBCR
40.0000 meq | EXTENDED_RELEASE_TABLET | Freq: Once | ORAL | Status: AC
  Administered 2014-09-29: 40 meq via ORAL
  Filled 2014-09-29: qty 2

## 2014-09-29 MED ORDER — LISINOPRIL 5 MG PO TABS
5.0000 mg | ORAL_TABLET | Freq: Once | ORAL | Status: AC
  Administered 2014-09-29: 5 mg via ORAL
  Filled 2014-09-29: qty 1
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
## 2014-09-29 NOTE — ED Notes (Signed)
MD Mclaurin at bedside.

## 2014-09-29 NOTE — ED Notes (Signed)
.  [facility] 

## 2014-09-29 NOTE — ED Notes (Signed)
Pt here by [facility] with [employer] with IVC papers, pt states he wants to hurt himself, no plan. Pt reports thoughts of SI for past 6 weeks. Pt is wheelchair bound and double amputee.

## 2014-09-30 ENCOUNTER — Encounter: Payer: Self-pay | Admitting: Occupational Medicine

## 2014-09-30 LAB — BASIC METABOLIC PANEL
ANION GAP: 9 (ref 5–15)
BUN: 9 mg/dL (ref 6–20)
CALCIUM: 9.3 mg/dL (ref 8.9–10.3)
CO2: 29 mmol/L (ref 22–32)
Chloride: 98 mmol/L — ABNORMAL LOW (ref 101–111)
Creatinine, Ser: 0.86 mg/dL (ref 0.61–1.24)
GLUCOSE: 208 mg/dL — AB (ref 65–99)
POTASSIUM: 3.3 mmol/L — AB (ref 3.5–5.1)
Sodium: 136 mmol/L (ref 135–145)

## 2014-09-30 LAB — GLUCOSE, CAPILLARY
GLUCOSE-CAPILLARY: 113 mg/dL — AB (ref 65–99)
GLUCOSE-CAPILLARY: 67 mg/dL (ref 65–99)
GLUCOSE-CAPILLARY: 98 mg/dL (ref 65–99)
Glucose-Capillary: 98 mg/dL (ref 65–99)
Glucose-Capillary: 235 mg/dL — ABNORMAL HIGH (ref 65–99)
Glucose-Capillary: 198 mg/dL — ABNORMAL HIGH (ref 65–99)

## 2014-09-30 MED ORDER — PREGABALIN 75 MG PO CAPS
75.0000 mg | ORAL_CAPSULE | Freq: Once | ORAL | Status: AC
  Administered 2014-09-30: 75 mg via ORAL
  Filled 2014-09-30: qty 1

## 2014-09-30 MED ORDER — PANTOPRAZOLE SODIUM 40 MG PO TBEC
40.0000 mg | DELAYED_RELEASE_TABLET | Freq: Every day | ORAL | Status: DC
  Administered 2014-09-30: 40 mg via ORAL
  Filled 2014-09-30: qty 1

## 2014-09-30 MED ORDER — INSULIN ASPART 100 UNIT/ML ~~LOC~~ SOLN
10.0000 [IU] | Freq: Two times a day (BID) | SUBCUTANEOUS | Status: DC
  Administered 2014-09-30 (×2): 10 [IU] via SUBCUTANEOUS
  Filled 2014-09-30: qty 15
  Filled 2014-09-30 (×2): qty 10

## 2014-09-30 MED ORDER — FLUTICASONE PROPIONATE 50 MCG/ACT NA SUSP
1.0000 | Freq: Every day | NASAL | Status: DC
  Administered 2014-09-30: 1 via NASAL
  Filled 2014-09-30: qty 16

## 2014-09-30 MED ORDER — PREGABALIN 75 MG PO CAPS
75.0000 mg | ORAL_CAPSULE | Freq: Three times a day (TID) | ORAL | Status: DC
  Administered 2014-09-30 (×3): 75 mg via ORAL
  Filled 2014-09-30 (×3): qty 1

## 2014-09-30 MED ORDER — FLUDROCORTISONE ACETATE 0.1 MG PO TABS
0.2000 mg | ORAL_TABLET | Freq: Every day | ORAL | Status: DC
  Administered 2014-09-30: 0.2 mg via ORAL
  Filled 2014-09-30 (×2): qty 2

## 2014-09-30 MED ORDER — LURASIDONE HCL 40 MG PO TABS
ORAL_TABLET | ORAL | Status: AC
  Administered 2014-09-30: 120 mg via ORAL
  Filled 2014-09-30: qty 3

## 2014-09-30 MED ORDER — LORATADINE 10 MG PO TABS
10.0000 mg | ORAL_TABLET | Freq: Every day | ORAL | Status: DC
  Administered 2014-09-30: 10 mg via ORAL
  Filled 2014-09-30 (×2): qty 1

## 2014-09-30 MED ORDER — INSULIN DETEMIR 100 UNIT/ML ~~LOC~~ SOLN
15.0000 [IU] | Freq: Every day | SUBCUTANEOUS | Status: DC
  Administered 2014-09-30: 15 [IU] via SUBCUTANEOUS

## 2014-09-30 MED ORDER — FLUOXETINE HCL 20 MG PO CAPS
60.0000 mg | ORAL_CAPSULE | Freq: Every day | ORAL | Status: DC
  Administered 2014-09-30: 60 mg via ORAL
  Filled 2014-09-30: qty 3

## 2014-09-30 MED ORDER — LISINOPRIL 5 MG PO TABS
5.0000 mg | ORAL_TABLET | Freq: Every day | ORAL | Status: DC
  Administered 2014-09-30: 5 mg via ORAL
  Filled 2014-09-30 (×2): qty 1

## 2014-09-30 MED ORDER — POTASSIUM CHLORIDE CRYS ER 20 MEQ PO TBCR
EXTENDED_RELEASE_TABLET | ORAL | Status: AC
  Administered 2014-09-30: 20 meq via ORAL
  Filled 2014-09-30: qty 1

## 2014-09-30 MED ORDER — POTASSIUM CHLORIDE CRYS ER 20 MEQ PO TBCR
20.0000 meq | EXTENDED_RELEASE_TABLET | Freq: Once | ORAL | Status: AC
  Administered 2014-09-30: 20 meq via ORAL

## 2014-09-30 MED ORDER — MELATONIN 3 MG PO TABS: 1.0000 | ORAL_TABLET | Freq: Every day | ORAL | Status: DC

## 2014-09-30 MED ORDER — PREDNISONE 10 MG PO TABS
10.0000 mg | ORAL_TABLET | Freq: Every day | ORAL | Status: DC
  Administered 2014-09-30: 10 mg via ORAL
  Filled 2014-09-30: qty 1

## 2014-09-30 MED ORDER — LURASIDONE HCL 120 MG PO TABS
120.0000 mg | ORAL_TABLET | Freq: Every day | ORAL | Status: DC
  Administered 2014-09-30: 120 mg via ORAL

## 2014-09-30 MED ORDER — HYDRALAZINE HCL 25 MG PO TABS
25.0000 mg | ORAL_TABLET | Freq: Three times a day (TID) | ORAL | Status: DC
  Administered 2014-09-30 (×3): 25 mg via ORAL
  Filled 2014-09-30 (×3): qty 1

## 2014-09-30 NOTE — ED Notes (Signed)
BEHAVIORAL HEALTH ROUNDING Patient sleeping: No. Patient alert and oriented: yes Behavior appropriate: Yes.  ; If no, describe:  Nutrition and fluids offered: Yes  Toileting and hygiene offered: Yes  Sitter present: yes Law enforcement present: Yes  

## 2014-09-30 NOTE — ED Provider Notes (Addendum)
-----------------------------------------   6:43 AM on 09/30/2014 -----------------------------------------   Blood pressure 174/86, pulse 70, temperature 98.2 F (36.8 C), temperature source Oral, resp. rate 18, height  (1.778 m), weight 194 lb (87.998 kg), SpO2 98 %.  The patient had no acute events since last update.  Calm and cooperative at this time.  Disposition is pending per Psychiatry/Behavioral Medicine team recommendations. Blood sugar improved. Blood pressure improved. Will repeat metabolic panel to recheck potassium.    Irean Hong, MD 09/30/14 [id number]  Irean Hong, MD 09/30/14 [id number]

## 2014-09-30 NOTE — ED Notes (Signed)
BEHAVIORAL HEALTH ROUNDING Patient sleeping: No. Patient alert and oriented: yes Behavior appropriate: Yes.  ; If no, describe:  Nutrition and fluids offered: Yes  Toileting and hygiene offered: Yes  Sitter present: not applicable Law enforcement present: Yes  

## 2014-09-30 NOTE — ED Notes (Signed)
BEHAVIORAL HEALTH ROUNDING Patient sleeping: Yes.   Patient alert and oriented: sleeping Behavior appropriate: Yes.  ; If no, describe:  Nutrition and fluids offered: sleeping Toileting and hygiene offered: Yes  Sitter present: yes Law enforcement present: Yes

## 2014-09-30 NOTE — BHH Counselor (Signed)
Followed up with Isla Pence RN [employer]) regarding Pt labs. Updated abs requested so that they may be faxed to [facility] for placement.

## 2014-09-30 NOTE — ED Notes (Signed)

## 2014-09-30 NOTE — ED Notes (Signed)
BEHAVIORAL HEALTH ROUNDING Patient sleeping: Yes.   Patient alert and oriented: yes Behavior appropriate: Yes.  ;sleeping Nutrition and fluids offered: Yes when wake  Toileting and hygiene offered: Yes every 2hours has urinal at bedside Sitter present: yes Law enforcement present: Yes

## 2014-09-30 NOTE — ED Notes (Signed)
Patient assigned to appropriate care area. Patient oriented to unit/care area: Informed that, for their safety, care areas are designed for safety and monitored by staff at all times; and visiting hours explained to patient. Patient verbalizes understanding, and verbal contract for safety obtained. 

## 2014-09-30 NOTE — ED Notes (Signed)
BEHAVIORAL HEALTH ROUNDING Patient sleeping: No. Patient alert and oriented: not applicable Behavior appropriate: Yes.  ; If no, describe:  Nutrition and fluids offered: Yes  Toileting and hygiene offered: Yes  Sitter present: not applicable Law enforcement present: Yes  

## 2014-09-30 NOTE — ED Notes (Signed)
BEHAVIORAL HEALTH ROUNDING Patient sleeping: no  Patient alert and oriented: yes Behavior appropriate: Yes.  ; If no, describe:  Nutrition and fluids offered: Yes  Toileting and hygiene offered: Yes  Sitter present: yes Law enforcement present: Yes  

## 2014-09-30 NOTE — BH Specialist Note (Signed)
Spoke with [name] at [facility].  [name] has been accepted by Dr. Lowanda Foster.  Please call report to [phone].

## 2014-09-30 NOTE — ED Notes (Signed)
BEHAVIORAL HEALTH ROUNDING Patient sleeping: Yes.   Patient alert and oriented: not applicable Behavior appropriate: Yes.  ; If no, describe:  Nutrition and fluids offered: Yes  Toileting and hygiene offered: Yes  Sitter present: not applicable Law enforcement present: Yes  

## 2014-09-30 NOTE — ED Notes (Signed)
pt presents from [facility] severely depressed with suicidal ideation with a plan to stop all of his medications and not eat. Patient had his right leg amputated one year ago for gangrene in his left leg amputated 3 months ago for a "insect bite". He recently got fitted with prostheses and is learning to walk with a walker. He feels his depression is due to losing his legs, losing his home, and having to move his mother into a care home. His mother was recently diagnosed with bipolar dementia; he had cared for her for 30 years but after undergoing leg amputations was no longer able to care for her; he is emotionally close to her and is very upset about being more than 30 miles away from her. He states he lost his home because his mother help to pay the bills. He states he has never been this depressed before. He is also unhappy about the group home he was placed in after his surgeries at [facility].  Pt is pleasant and cooperative at this time. Pt denies any SI/HI. Pt noted to be insulin dependent DM. Pt noted to be hopeless and depressed.

## 2014-09-30 NOTE — ED Notes (Signed)
Spoke with [name] and gave hand-off report at this time.
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
## 2014-09-30 NOTE — ED Notes (Signed)
Patient assigned to appropriate care area. Patient oriented to unit/care area: Informed that, for their safety, care areas are designed for safety and monitored by security cameras at all times; and visiting hours explained to patient. Patient verbalizes understanding, and verbal contract for safety obtained. 

## 2014-09-30 NOTE — ED Notes (Signed)
ED BHU PLACEMENT JUSTIFICATION Is the patient under IVC or is there intent for IVC: Yes.   Is the patient medically cleared: No. Is there vacancy in the ED BHU: No. Is the population mix appropriate for patient: No. Is the patient awaiting placement in inpatient or outpatient setting: Yes.   Has the patient had a psychiatric consult: Only seen TTS Survey of unit performed for contraband, proper placement and condition of furniture, tampering with fixtures in bathroom, shower, and each patient room: Yes.  ; Findings:  APPEARANCE/BEHAVIOR cooperative and adequate rapport can be established NEURO ASSESSMENT Orientation: time person place Hallucinations: No.None noted (Hallucinations) Speech: Pressured Gait: unsteady, pt has a prostetic legs bilaterally and walker to walk RESPIRATORY ASSESSMENT Normal expansion.  Clear to auscultation.  No rales, rhonchi, or wheezing. CARDIOVASCULAR ASSESSMENT regular rate and rhythm, S1, S2 normal, no murmur, click, rub or gallop GASTROINTESTINAL ASSESSMENT soft, nontender, BS WNL, no r/g EXTREMITIES Deformities bilateral amputees PLAN OF CARE Provide calm/safe environment. Vital signs assessed twice daily. ED BHU Assessment once each 12-hour shift. Collaborate with intake RN daily or as condition indicates. Assure the ED provider has rounded once each shift. Provide and encourage hygiene. Provide redirection as needed. Assess for escalating behavior; address immediately and inform ED provider.  Assess family dynamic and appropriateness for visitation as needed: No.; If necessary, describe findings:  Educate the patient/family about BHU procedures/visitation: No.; If necessary, describe findings:

## 2014-09-30 NOTE — ED Notes (Signed)
Lab received a "save Tube" for the BMP this morning at 0643 and was not made aware to run the blood work. Lab just made aware we needed the add on of blood ran. They are running the blood work now.

## 2014-09-30 NOTE — BH Specialist Note (Signed)
Information faxed and received to [facility].Spoke with the inquiry nurse, Request an update be faxed to her on Blood pressure, blood sugars, and potassium before approval.   Inquiry Nurse is Nicholos Johns at [phone]

## 2014-09-30 NOTE — ED Notes (Signed)
BEHAVIORAL HEALTH ROUNDING Patient sleeping: Yes.   Patient alert and oriented: sleeping Behavior appropriate: Yes.  ; If no, describe: sleeping Nutrition and fluids offered: sleeping Toileting and hygiene offered: sleeping Sitter present: yes Law enforcement present: Yes  

## 2014-10-02 LAB — GLUCOSE, CAPILLARY: GLUCOSE-CAPILLARY: 271 mg/dL — AB (ref 65–99)

## 2015-05-07 IMAGING — CR DG TIBIA/FIBULA 2V*L*
3 series · 3 of 3 positions shown · non-contrast
Comparison: 01/10/2011 ankle radiographs, 09/08/2013 foot
radiographs

CLINICAL DATA: Bleeding wound

EXAM:
LEFT TIBIA AND FIBULA - 2 VIEW

[x tib-fib ap left]
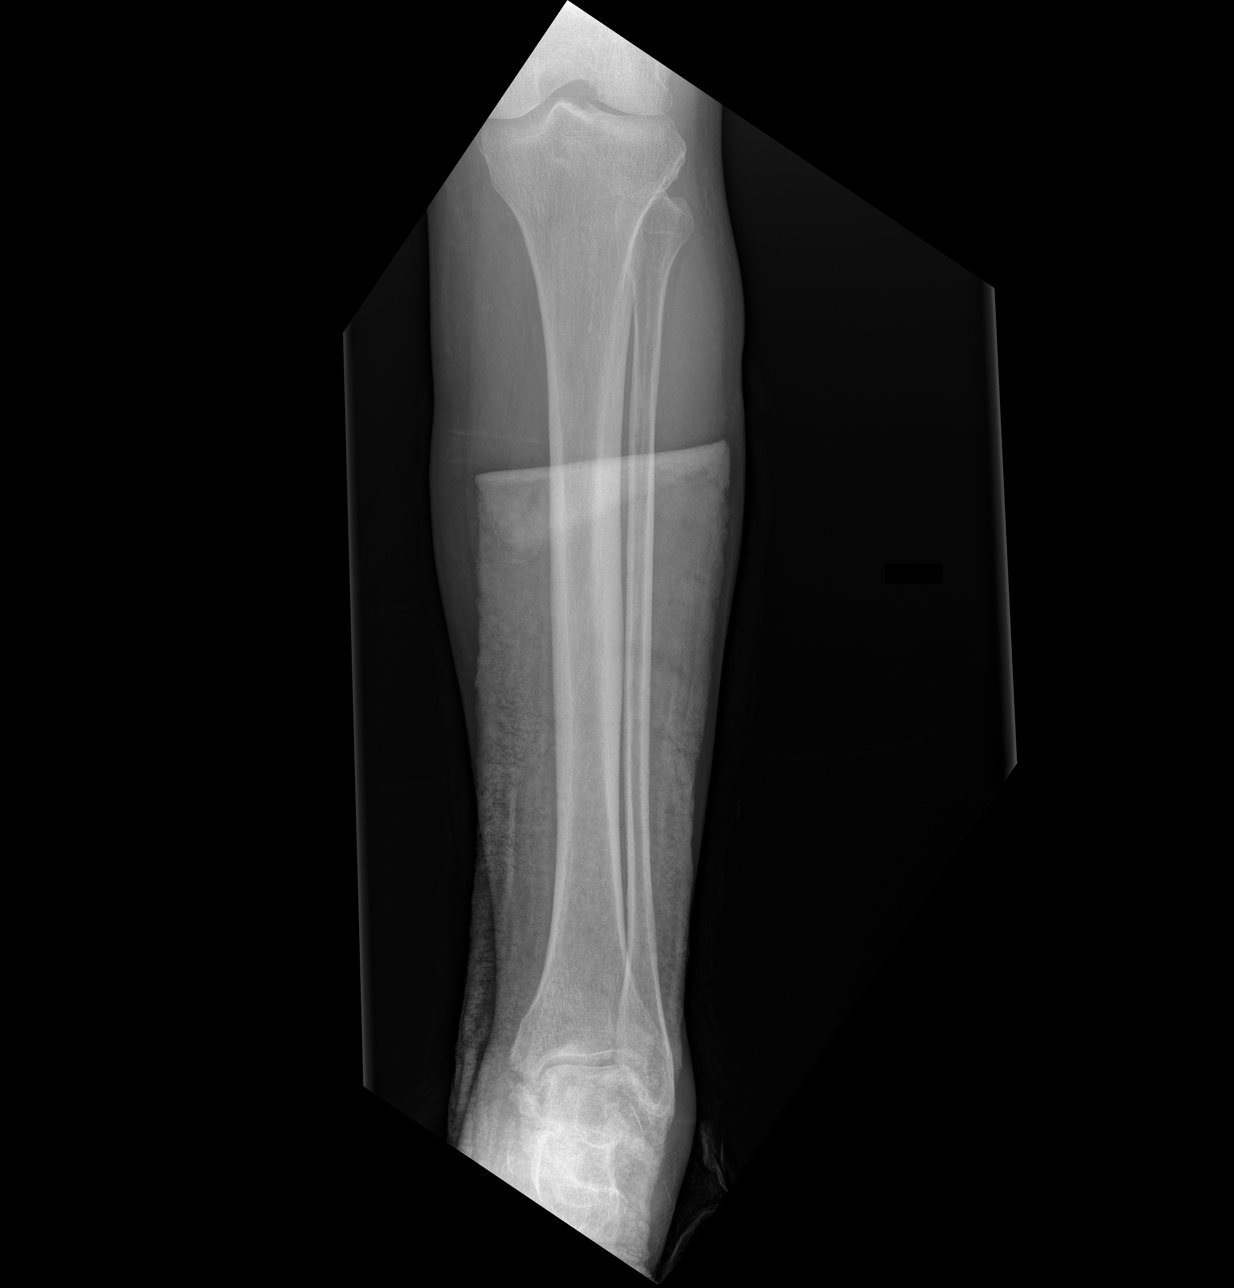

[x tib-fib lat left (1 of 2)]
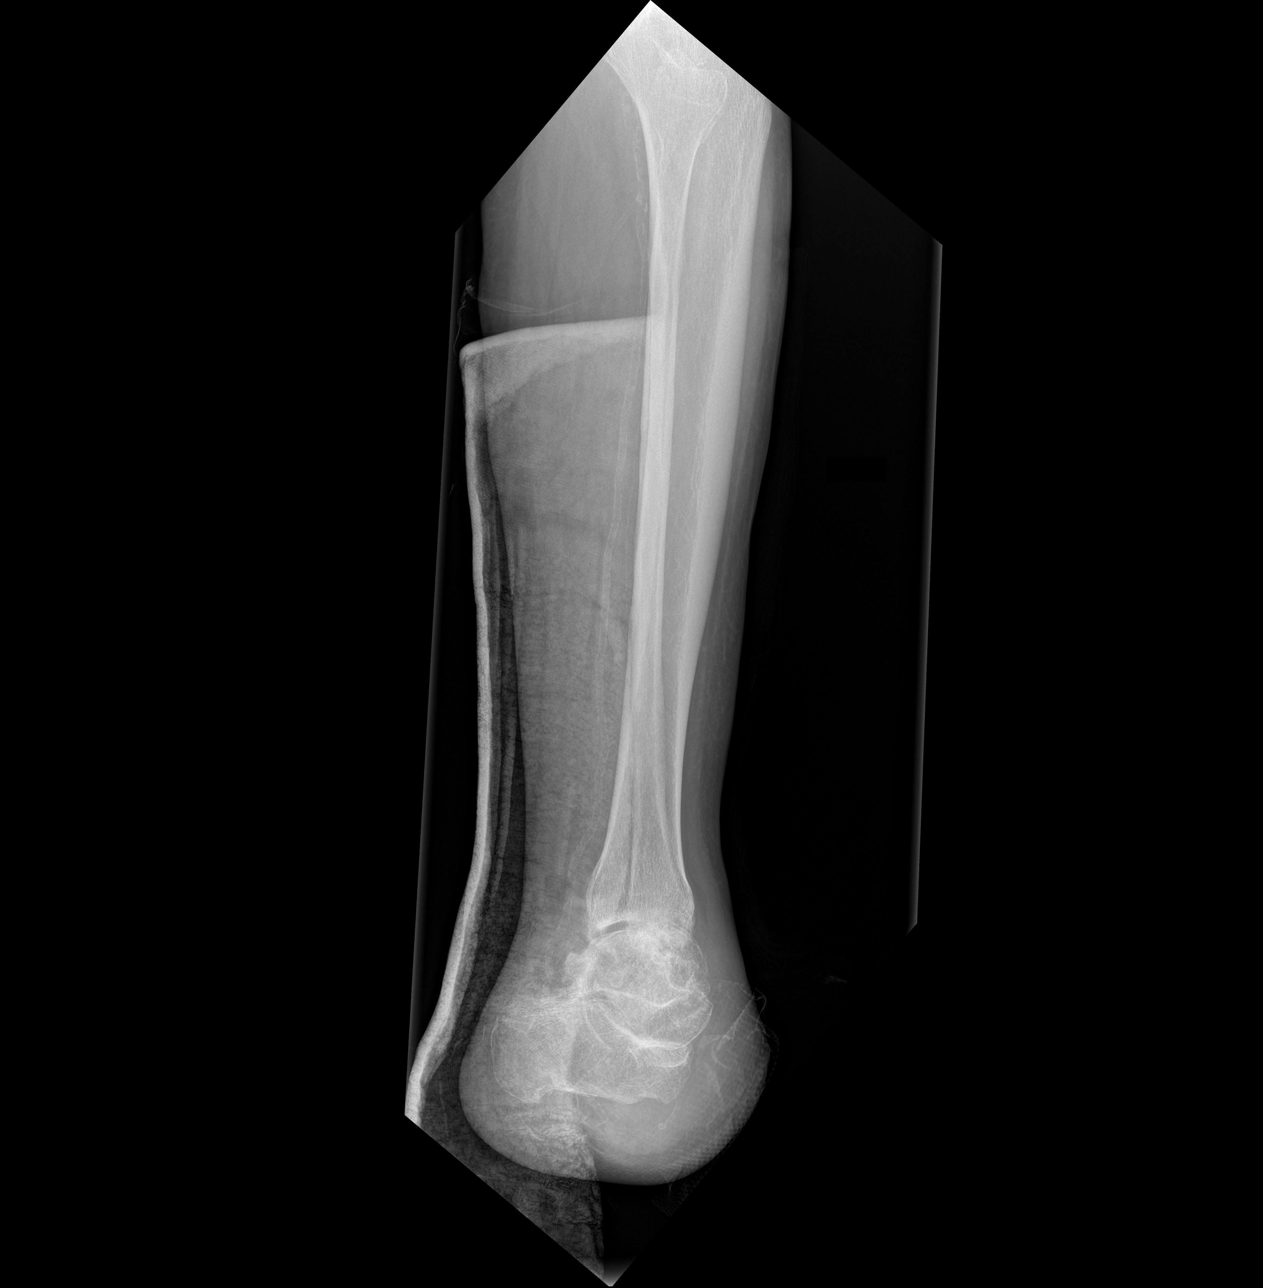

[x tib-fib lat left (2 of 2)]
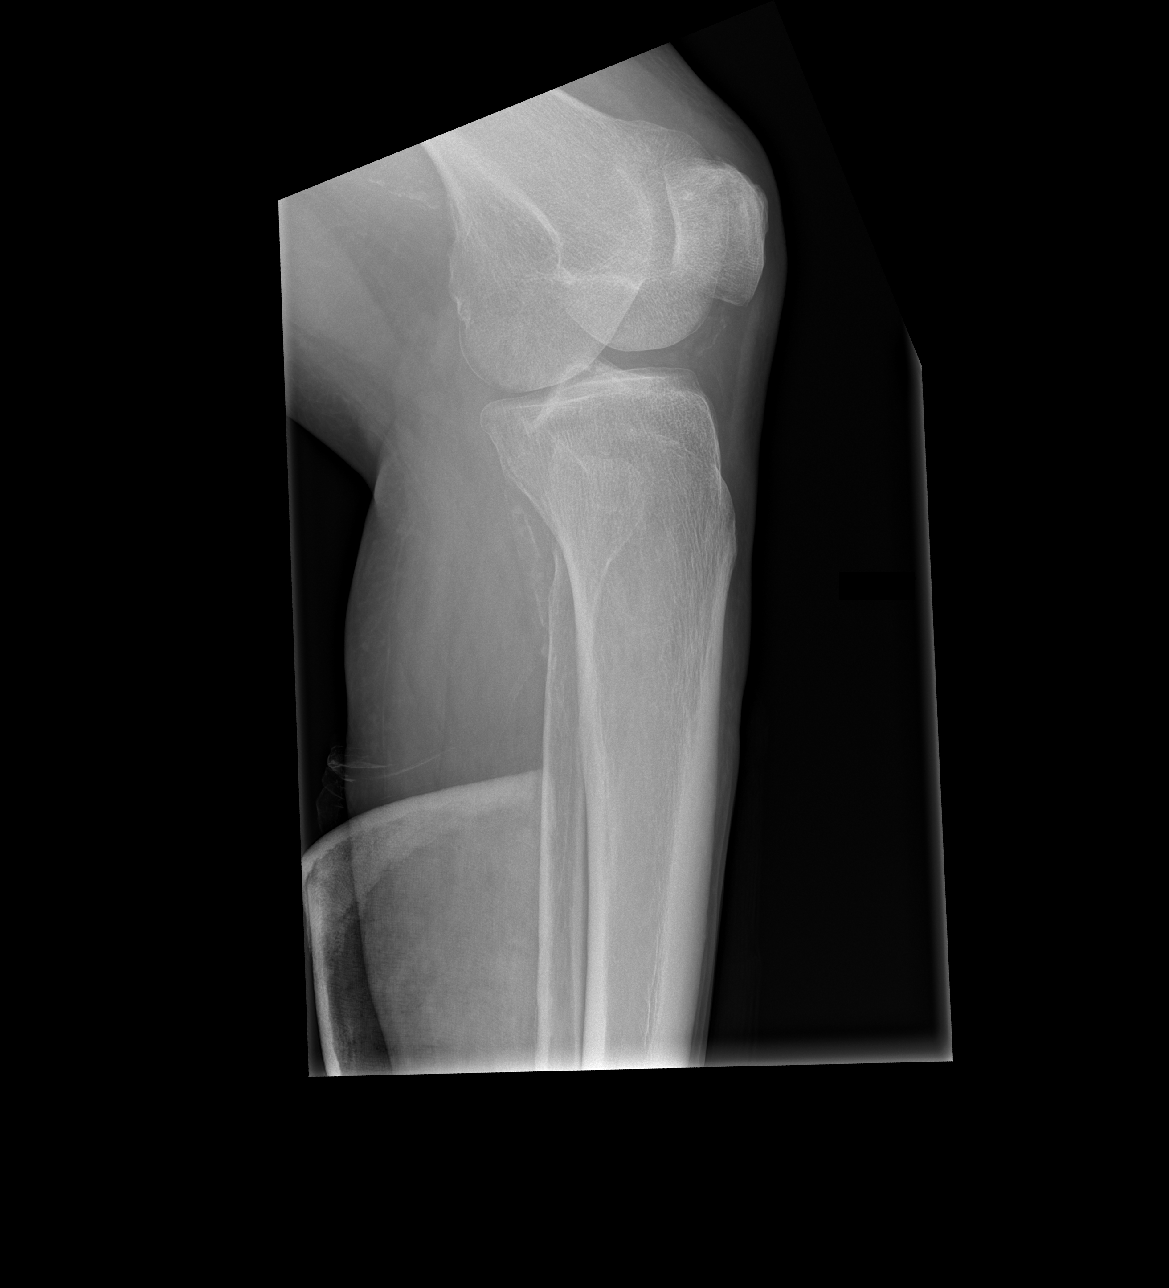

[3 of 3 positions shown; findings below may reference images not displayed]

FINDINGS: Overlying bandage obscures osseous detail. Diffuse osteopenia.
Interval distal foot amputation. Sequelae of prior medial malleolus
fracture. Irregularity along the talar dome may be degenerative.
Submitted views are not optimized to evaluate the joint spaces.
Furthermore, the views are not adequate to exclude a nondisplaced
fracture or osteomyelitis. Atherosclerotic vascular calcifications.
Linear 6 mm metallic density projects within the soft tissues
posterior to the calcaneus.
IMPRESSION: Limited by overlying artifact.Interval distal foot amputation.
Sequelae of prior medial malleolus fracture.

Linear 6 mm metallic density projecting over the soft tissues
posterior to the calcaneus for which a foreign body is not excluded.

## 2015-05-28 ENCOUNTER — Encounter: Payer: Self-pay | Admitting: Emergency Medicine

## 2015-05-28 ENCOUNTER — Emergency Department
Admission: EM | Admit: 2015-05-28 | Discharge: 2015-05-31 | Disposition: A | Discharge status: Other Acute Inpatient Hospital | Payer: Medicaid Other | Attending: Emergency Medicine | Admitting: Emergency Medicine

## 2015-05-28 DIAGNOSIS — E119 Type 2 diabetes mellitus without complications: Secondary | ICD-10-CM | POA: Diagnosis not present

## 2015-05-28 DIAGNOSIS — G8929 Other chronic pain: Secondary | ICD-10-CM | POA: Diagnosis not present

## 2015-05-28 DIAGNOSIS — F4310 Post-traumatic stress disorder, unspecified: Secondary | ICD-10-CM | POA: Diagnosis not present

## 2015-05-28 DIAGNOSIS — Z9119 Patient's noncompliance with other medical treatment and regimen: Secondary | ICD-10-CM

## 2015-05-28 DIAGNOSIS — G43909 Migraine, unspecified, not intractable, without status migrainosus: Secondary | ICD-10-CM | POA: Diagnosis not present

## 2015-05-28 DIAGNOSIS — F333 Major depressive disorder, recurrent, severe with psychotic symptoms: Secondary | ICD-10-CM

## 2015-05-28 DIAGNOSIS — F319 Bipolar disorder, unspecified: Secondary | ICD-10-CM | POA: Diagnosis not present

## 2015-05-28 DIAGNOSIS — I10 Essential (primary) hypertension: Secondary | ICD-10-CM | POA: Diagnosis not present

## 2015-05-28 DIAGNOSIS — M86172 Other acute osteomyelitis, left ankle and foot: Secondary | ICD-10-CM | POA: Insufficient documentation

## 2015-05-28 DIAGNOSIS — F418 Other specified anxiety disorders: Secondary | ICD-10-CM | POA: Insufficient documentation

## 2015-05-28 DIAGNOSIS — Z87891 Personal history of nicotine dependence: Secondary | ICD-10-CM | POA: Diagnosis not present

## 2015-05-28 DIAGNOSIS — K219 Gastro-esophageal reflux disease without esophagitis: Secondary | ICD-10-CM | POA: Diagnosis not present

## 2015-05-28 DIAGNOSIS — R0600 Dyspnea, unspecified: Secondary | ICD-10-CM | POA: Diagnosis not present

## 2015-05-28 DIAGNOSIS — R0602 Shortness of breath: Secondary | ICD-10-CM | POA: Diagnosis not present

## 2015-05-28 DIAGNOSIS — M549 Dorsalgia, unspecified: Secondary | ICD-10-CM | POA: Insufficient documentation

## 2015-05-28 DIAGNOSIS — E876 Hypokalemia: Secondary | ICD-10-CM | POA: Diagnosis present

## 2015-05-28 DIAGNOSIS — H409 Unspecified glaucoma: Secondary | ICD-10-CM | POA: Insufficient documentation

## 2015-05-28 DIAGNOSIS — J449 Chronic obstructive pulmonary disease, unspecified: Secondary | ICD-10-CM | POA: Diagnosis not present

## 2015-05-28 DIAGNOSIS — F2089 Other schizophrenia: Secondary | ICD-10-CM | POA: Insufficient documentation

## 2015-05-28 DIAGNOSIS — E1140 Type 2 diabetes mellitus with diabetic neuropathy, unspecified: Secondary | ICD-10-CM | POA: Diagnosis not present

## 2015-05-28 DIAGNOSIS — Z89511 Acquired absence of right leg below knee: Secondary | ICD-10-CM

## 2015-05-28 DIAGNOSIS — E271 Primary adrenocortical insufficiency: Secondary | ICD-10-CM | POA: Insufficient documentation

## 2015-05-28 DIAGNOSIS — Z89512 Acquired absence of left leg below knee: Secondary | ICD-10-CM

## 2015-05-28 DIAGNOSIS — Z794 Long term (current) use of insulin: Secondary | ICD-10-CM | POA: Diagnosis not present

## 2015-05-28 DIAGNOSIS — Z046 Encounter for general psychiatric examination, requested by authority: Secondary | ICD-10-CM | POA: Diagnosis present

## 2015-05-28 DIAGNOSIS — Z91199 Patient's noncompliance with other medical treatment and regimen due to unspecified reason: Secondary | ICD-10-CM

## 2015-05-28 LAB — GLUCOSE, CAPILLARY: Glucose-Capillary: 253 mg/dL — ABNORMAL HIGH (ref 65–99)

## 2015-05-28 NOTE — ED Notes (Signed)
Patient denies HI/SI. Patient states he does have major depression but that is not the reason he wishes to stop taking his medication.
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
## 2015-05-28 NOTE — ED Notes (Signed)
Per EMS patient comes from home. Patient receives home health care from [facility]. They called EMS when patient refused his evening medications. Patient denies HI or SI. Patient denies CP. Patient c/o chronic bilateral leg and arm pain due to neuropathy. Patient is A&O x4.

## 2015-05-28 NOTE — Discharge Instructions (Signed)
Please seek medical attention for any high fevers, chest pain, shortness of breath, change in behavior, persistent vomiting, bloody stool or any other new or concerning symptoms.   Schizophrenia Schizophrenia is a mental illness. It may cause disturbed or disorganized thinking, speech, or behavior. People with schizophrenia have problems functioning in one or more areas of life: work, school, home, or relationships. People with schizophrenia are at increased risk for suicide, certain chronic physical illnesses, and unhealthy behaviors, such as smoking and drug use. People who have family members with schizophrenia are at higher risk of developing the illness. Schizophrenia affects men and women equally but usually appears at an earlier age (teenage or early adult years) in men.  SYMPTOMS The earliest symptoms are often subtle (prodrome) and may go unnoticed until the illness becomes more severe (first-break psychosis). Symptoms of schizophrenia may be continuous or may come and go in severity. Episodes often are triggered by major life events, such as family stress, college, [employer] service, marriage, pregnancy or child birth, divorce, or loss of a loved one. People with schizophrenia may see, hear, or feel things that do not exist (hallucinations). They may have false beliefs in spite of obvious proof to the contrary (delusions). Sometimes speech is incoherent or behavior is odd or withdrawn.  DIAGNOSIS Schizophrenia is diagnosed through an assessment by your caregiver. Your caregiver will ask questions about your thoughts, behavior, mood, and ability to function in daily life. Your caregiver may ask questions about your medical history and use of alcohol or drugs, including prescription medication. Your caregiver may also order blood tests and imaging exams. Certain medical conditions and substances can cause symptoms that resemble schizophrenia. Your caregiver may refer you to a mental health specialist  for evaluation. There are three major criterion for a diagnosis of schizophrenia:  Two or more of the following five symptoms are present for a month or longer:  Delusions. Often the delusions are that you are being attacked, harassed, cheated, persecuted or conspired against (persecutory delusions).  Hallucinations.   Disorganized speech that does not make sense to others.  Grossly disorganized (confused or unfocused) behavior or extremely overactive or underactive motor activity (catatonia).  Negative symptoms such as bland or blunted emotions (flat affect), loss of will power (avolition), and withdrawal from social contacts (social isolation).  Level of functioning in one or more major areas of life (work, school, relationships, or self-care) is markedly below the level of functioning before the onset of illness.   There are continuous signs of illness (either mild symptoms or decreased level of functioning) for at least 6 months or longer. TREATMENT  Schizophrenia is a long-term illness. It is best controlled with continuous treatment rather than treatment only when symptoms occur. The following treatments are used to manage schizophrenia:  Medication--Medication is the most effective and important form of treatment for schizophrenia. Antipsychotic medications are usually prescribed to help manage schizophrenia. Other types of medication may be added to relieve any symptoms that may occur despite the use of antipsychotic medications.  Counseling or talk therapy--Individual, group, or family counseling may be helpful in providing education, support, and guidance. Many people with schizophrenia also benefit from social skills and job skills (vocational) training. A combination of medication and counseling is best for managing the disorder over time. A procedure in which electricity is applied to the brain through the scalp (electroconvulsive therapy) may be used to treat catatonic  schizophrenia or schizophrenia in people who cannot take or do not respond to medication  and counseling.   This information is not intended to replace advice given to you by your health care provider. Make sure you discuss any questions you have with your health care provider.   Document Released: 01/29/2000 Document Revised: 02/21/2014 Document Reviewed: 04/25/2012 Elsevier Interactive Patient Education [employer]2016 [employer]

## 2015-05-29 DIAGNOSIS — Z91199 Patient's noncompliance with other medical treatment and regimen due to unspecified reason: Secondary | ICD-10-CM

## 2015-05-29 DIAGNOSIS — Z89512 Acquired absence of left leg below knee: Secondary | ICD-10-CM

## 2015-05-29 DIAGNOSIS — E876 Hypokalemia: Secondary | ICD-10-CM | POA: Diagnosis present

## 2015-05-29 DIAGNOSIS — Z89511 Acquired absence of right leg below knee: Secondary | ICD-10-CM

## 2015-05-29 DIAGNOSIS — Z9119 Patient's noncompliance with other medical treatment and regimen: Secondary | ICD-10-CM

## 2015-05-29 DIAGNOSIS — F333 Major depressive disorder, recurrent, severe with psychotic symptoms: Secondary | ICD-10-CM

## 2015-05-29 LAB — GLUCOSE, CAPILLARY
GLUCOSE-CAPILLARY: 274 mg/dL — AB (ref 65–99)
GLUCOSE-CAPILLARY: 159 mg/dL — AB (ref 65–99)

## 2015-05-29 MED ORDER — INSULIN ASPART 100 UNIT/ML ~~LOC~~ SOLN
10.0000 [IU] | Freq: Two times a day (BID) | SUBCUTANEOUS | Status: DC
  Administered 2015-05-29 – 2015-05-31 (×4): 10 [IU] via SUBCUTANEOUS
  Filled 2015-05-29 (×3): qty 10

## 2015-05-29 MED ORDER — PANTOPRAZOLE SODIUM 40 MG PO TBEC
40.0000 mg | DELAYED_RELEASE_TABLET | Freq: Every day | ORAL | Status: DC
  Administered 2015-05-29 – 2015-05-31 (×3): 40 mg via ORAL
  Filled 2015-05-29 (×3): qty 1

## 2015-05-29 MED ORDER — INSULIN DETEMIR 100 UNIT/ML ~~LOC~~ SOLN
15.0000 [IU] | Freq: Every day | SUBCUTANEOUS | Status: DC
  Administered 2015-05-29: 15 [IU] via SUBCUTANEOUS
  Filled 2015-05-29 (×4): qty 0.15

## 2015-05-29 MED ORDER — AMLODIPINE BESYLATE 5 MG PO TABS
5.0000 mg | ORAL_TABLET | Freq: Every day | ORAL | Status: DC
  Administered 2015-05-29 – 2015-05-31 (×3): 5 mg via ORAL
  Filled 2015-05-29 (×4): qty 1

## 2015-05-29 MED ORDER — POTASSIUM CHLORIDE CRYS ER 20 MEQ PO TBCR
20.0000 meq | EXTENDED_RELEASE_TABLET | Freq: Every day | ORAL | Status: DC
  Administered 2015-05-29 – 2015-05-30 (×2): 20 meq via ORAL
  Filled 2015-05-29 (×2): qty 1

## 2015-05-29 MED ORDER — MIRTAZAPINE 15 MG PO TABS
45.0000 mg | ORAL_TABLET | Freq: Every day | ORAL | Status: DC
  Administered 2015-05-29 – 2015-05-30 (×2): 45 mg via ORAL
  Filled 2015-05-29 (×2): qty 3

## 2015-05-29 MED ORDER — CLONAZEPAM 0.5 MG PO TABS
0.5000 mg | ORAL_TABLET | Freq: Two times a day (BID) | ORAL | Status: DC
  Administered 2015-05-29 – 2015-05-31 (×4): 0.5 mg via ORAL
  Filled 2015-05-29 (×4): qty 1

## 2015-05-29 MED ORDER — PREGABALIN 75 MG PO CAPS
75.0000 mg | ORAL_CAPSULE | Freq: Three times a day (TID) | ORAL | Status: DC
  Administered 2015-05-29 – 2015-05-31 (×6): 75 mg via ORAL
  Filled 2015-05-29 (×7): qty 1

## 2015-05-29 MED ORDER — HYDRALAZINE HCL 25 MG PO TABS
25.0000 mg | ORAL_TABLET | Freq: Three times a day (TID) | ORAL | Status: DC
  Administered 2015-05-29 – 2015-05-31 (×7): 25 mg via ORAL
  Filled 2015-05-29 (×11): qty 1

## 2015-05-29 MED ORDER — LURASIDONE HCL 80 MG PO TABS
80.0000 mg | ORAL_TABLET | Freq: Every day | ORAL | Status: DC
  Administered 2015-05-29 – 2015-05-31 (×3): 80 mg via ORAL
  Filled 2015-05-29 (×3): qty 1
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
## 2015-05-29 NOTE — ED Notes (Signed)
Per Dr. Derrill KayGoodman, no labs needed at this time

## 2015-05-29 NOTE — ED Notes (Signed)
ENVIRONMENTAL ASSESSMENT Potentially harmful objects out of patient reach: YES Personal belongings secured: YES Patient dressed in hospital provided attire only: YES Plastic bags out of patient reach: YES Patient care equipment (cords, cables, call bells, lines, and drains) shortened, removed, or accounted for: YES Equipment and supplies removed from bottom of stretcher: YES Potentially toxic materials out of patient reach: YES Sharps container removed or out of patient reach: YES  BEHAVIORAL HEALTH ROUNDING Patient sleeping: YES Patient alert and oriented: YES Behavior appropriate: YES Describe behavior: No inappropriate or unacceptable behaviors noted at this time.  Nutrition and fluids offered: YES Toileting and hygiene offered: YES Sitter present: Behavioral tech rounding every 15 minutes on patient to ensure safety.  Law enforcement present: YES Law enforcement agency: [employer] ([employer]) 

## 2015-05-29 NOTE — ED Notes (Signed)

## 2015-05-29 NOTE — ED Notes (Signed)
BEHAVIORAL HEALTH ROUNDING Patient sleeping: Yes.   Patient alert and oriented: pt asleep Behavior appropriate: Yes.  ; If no, describe:  Nutrition and fluids offered: pt asleep Toileting and hygiene offered: pt asleep Sitter present: ED Rover Present Law enforcement present: Yes   

## 2015-05-29 NOTE — ED Notes (Signed)
IVC / Consult completed/ Pending Placement 

## 2015-05-29 NOTE — Progress Notes (Signed)
Referral information for Geriatric Placement have been faxed to;     [facility]([phone]),    [facility]([phone]),    [facility]([phone]),    [facility] [phone])   [phone]),    [facility]([phone]),    [phone]).  05/29/2015 Cheryl FlashNicole Navy Belay, MS, NCC, LPCA Therapeutic Triage Specialist

## 2015-05-29 NOTE — ED Notes (Signed)

## 2015-05-29 NOTE — Progress Notes (Signed)
TTS has consulted with the pts ED Gregory Renderprovider,Gregory Quale, MD. Pts provider has confirmed the a TTS consult is not needed at time. TTS will begin seeking placement for the pt on a [facility] as recommended by Dr. Toni Amendlapacs.   05/29/2015 Gregory FlashNicole Belma Dyches, MS, NCC, LPCA Therapeutic Triage Specialist

## 2015-05-30 ENCOUNTER — Emergency Department: Payer: Medicaid Other

## 2015-05-30 ENCOUNTER — Other Ambulatory Visit: Payer: Self-pay

## 2015-05-30 LAB — COMPREHENSIVE METABOLIC PANEL
ALBUMIN: 3.8 g/dL (ref 3.5–5.0)
ALT: 16 U/L — AB (ref 17–63)
AST: 21 U/L (ref 15–41)
Alkaline Phosphatase: 69 U/L (ref 38–126)
Anion gap: 7 (ref 5–15)
BILIRUBIN TOTAL: 0.8 mg/dL (ref 0.3–1.2)
BUN: 5 mg/dL — AB (ref 6–20)
CHLORIDE: 96 mmol/L — AB (ref 101–111)
CO2: 36 mmol/L — ABNORMAL HIGH (ref 22–32)
CREATININE: 0.91 mg/dL (ref 0.61–1.24)
Calcium: 8.7 mg/dL — ABNORMAL LOW (ref 8.9–10.3)
GFR calc Af Amer: >60 mL/min (ref >60)
GFR calc non Af Amer: >60 mL/min (ref >60)
GLUCOSE: 166 mg/dL — AB (ref 65–99)
POTASSIUM: 2.9 mmol/L — AB (ref 3.5–5.1)
Sodium: 139 mmol/L (ref 135–145)
TOTAL PROTEIN: 6.8 g/dL (ref 6.5–8.1)

## 2015-05-30 LAB — GLUCOSE, CAPILLARY
Glucose-Capillary: 87 mg/dL (ref 65–99)
Glucose-Capillary: 160 mg/dL — ABNORMAL HIGH (ref 65–99)
Glucose-Capillary: 91 mg/dL (ref 65–99)

## 2015-05-30 LAB — SALICYLATE LEVEL: Salicylate Lvl: <4 mg/dL (ref 2.8–30.0)

## 2015-05-30 LAB — CBC WITH DIFFERENTIAL/PLATELET
Basophils Absolute: 0.1 10*3/uL (ref 0–0.1)
Basophils Relative: 1 %
Eosinophils Absolute: 0.2 10*3/uL (ref 0–0.7)
Eosinophils Relative: 3 %
HEMATOCRIT: 38.2 % — AB (ref 40.0–52.0)
Hemoglobin: 13 g/dL (ref 13.0–18.0)
LYMPHS PCT: 21 %
Lymphs Abs: 1.4 10*3/uL (ref 1.0–3.6)
MCH: 26.7 pg (ref 26.0–34.0)
MCHC: 34 g/dL (ref 32.0–36.0)
MCV: 78.3 fL — ABNORMAL LOW (ref 80.0–100.0)
MONO ABS: 0.6 10*3/uL (ref 0.2–1.0)
MONOS PCT: 8 %
NEUTROS ABS: 4.7 10*3/uL (ref 1.4–6.5)
Neutrophils Relative %: 67 %
Platelets: 216 10*3/uL (ref 150–440)
RBC: 4.88 MIL/uL (ref 4.40–5.90)
RDW: 16.4 % — AB (ref 11.5–14.5)
WBC: 6.9 10*3/uL (ref 3.8–10.6)

## 2015-05-30 LAB — POTASSIUM: Potassium: 2.9 mmol/L — CL (ref 3.5–5.1)

## 2015-05-30 LAB — ETHANOL: Alcohol, Ethyl (B): <5 mg/dL (ref <5)

## 2015-05-30 LAB — ACETAMINOPHEN LEVEL: Acetaminophen (Tylenol), Serum: <10 ug/mL — ABNORMAL LOW (ref 10–30)

## 2015-05-30 MED ORDER — POTASSIUM CHLORIDE CRYS ER 20 MEQ PO TBCR
40.0000 meq | EXTENDED_RELEASE_TABLET | Freq: Once | ORAL | Status: AC
  Administered 2015-05-30: 40 meq via ORAL
  Filled 2015-05-30: qty 2

## 2015-05-30 MED ORDER — MAGNESIUM SULFATE 2 GM/50ML IV SOLN
2.0000 g | Freq: Once | INTRAVENOUS | Status: AC
  Administered 2015-05-30: 2 g via INTRAVENOUS
  Filled 2015-05-30: qty 50

## 2015-05-30 MED ORDER — INSULIN ASPART 100 UNIT/ML ~~LOC~~ SOLN
SUBCUTANEOUS | Status: AC
  Filled 2015-05-30: qty 10

## 2015-05-30 MED ORDER — AMLODIPINE BESYLATE 5 MG PO TABS
ORAL_TABLET | ORAL | Status: AC
  Filled 2015-05-30: qty 4

## 2015-05-30 MED ORDER — POTASSIUM CHLORIDE 20 MEQ/15ML (10%) PO SOLN
20.0000 meq | Freq: Once | ORAL | Status: AC
  Administered 2015-05-30: 20 meq via ORAL
  Filled 2015-05-30: qty 15

## 2015-05-30 NOTE — ED Notes (Signed)
Pt. Is sound asleep. No environmental hazards in area. Pt. Is resting comfortably. Nothing is needed by staff at this time

## 2015-05-30 NOTE — ED Notes (Signed)
CBG 87 McShane, MD informed. Given orange juice per MD request and hold morning insulin. Pt awaiting breakfast from cafeteria

## 2015-05-30 NOTE — ED Notes (Signed)
Patient given breakfast tray. Patient resting in bed at this time. NAD noted.

## 2015-05-30 NOTE — ED Provider Notes (Signed)
-----------------------------------------   6:52 AM on 05/30/2015 -----------------------------------------   BP 163/87 mmHg  Pulse 64  Temp(Src) 98.6 F (37 C) (Oral)  Resp 16  Ht 5\' 10"  (1.778 m)  Wt 190 lb (86.183 kg)  BMI 27.26 kg/m2  SpO2 94%  No acute events since last update.   Disposition is pending per Psychiatry/Behavioral Medicine team recommendations.     Phineas SemenGraydon Javayah Magaw, MD 05/30/15 [phone]

## 2015-05-30 NOTE — BH Assessment (Signed)
Writer followed up with Referrals;   [facility]([phone]), Currently at capacity.   [facility] [phone]([phone]), Pending Review   [facility]([phone]), pending review   [facility] ([phone]), Does not take patient's Medicaid   [phone]), Does not take patient's Medicaid   [facility] [phone]([phone]), Per their request, additional Information faxed to abs, EKG, Chest X-Ray & IVC.   [facility]([phone]), Information refaxed, was unable to locate the referral.   [facility] [phone]([phone]), Per their request, additional Information faxed to abs, EKG, Chest X-Ray & IVC. Requesting repeat potassium when level, after administration of medications for it.

## 2015-05-30 NOTE — ED Provider Notes (Signed)
-----------------------------------------   3:19 PM on 05/30/2015 -----------------------------------------  Referral physician's request we are obtaining basic blood work potassium slightly low way and we are repleting it, EKG shows a sinus rhythm with no acute ST elevation or depression, borderline long QT at 540 with nonspecific ST changes no acute ST elevation or depression patient has no  chest pain or shortness of breath  Jeanmarie PlantJames A Lionardo Haze, MD 05/30/15 1520

## 2015-05-30 NOTE — ED Notes (Signed)
Dr. Fanny BienQuale notified of critical lab value - Potassium of 2.9.  Acknowledged, orders to be placed in computer.

## 2015-05-30 NOTE — ED Notes (Signed)
Pt awake; going to watch some tv; no complaints or requests

## 2015-05-30 NOTE — ED Notes (Signed)
IVC / Consult completed/ Pending Placement 

## 2015-05-31 LAB — GLUCOSE, CAPILLARY
GLUCOSE-CAPILLARY: 170 mg/dL — AB (ref 65–99)
GLUCOSE-CAPILLARY: 111 mg/dL — AB (ref 65–99)
GLUCOSE-CAPILLARY: 225 mg/dL — AB (ref 65–99)

## 2015-05-31 LAB — POTASSIUM
POTASSIUM: 2.9 mmol/L — AB (ref 3.5–5.1)
POTASSIUM: 3.5 mmol/L (ref 3.5–5.1)

## 2015-05-31 MED ORDER — POTASSIUM CHLORIDE CRYS ER 20 MEQ PO TBCR
40.0000 meq | EXTENDED_RELEASE_TABLET | Freq: Once | ORAL | Status: AC
  Administered 2015-05-31: 40 meq via ORAL
  Filled 2015-05-31: qty 2

## 2015-05-31 NOTE — ED Provider Notes (Signed)
-----------------------------------------   4:04 PM on 05/31/2015 -----------------------------------------   Blood pressure 161/75, pulse 87, temperature 99.1 F (37.3 C), temperature source Oral, resp. rate 16, height 5\' 10"  (1.778 m), weight 190 lb (86.183 kg), SpO2 94 %.  The patient had no acute events since last update.  Calm and cooperative at this time.  Disposition is pending per Psychiatry/Behavioral Medicine team recommendations.     Arnaldo NatalPaul F Arias Weinert, MD 05/31/15 [phone]

## 2015-05-31 NOTE — BH Assessment (Signed)
Patient has been accepted to [facility].  Accepting physician is Dr. Lowanda FosterBeverly Jones.  Call report to [phone].  Representative was W.W. Grainger Inchonda.  ER Staff is aware of it French Ana(Tracy, ER Sectary & Chrissie NoaWilliam, Patient's Nurse)

## 2015-05-31 NOTE — ED Provider Notes (Signed)
-----------------------------------------   6:49 AM on 05/31/2015 -----------------------------------------   Blood pressure 134/74, pulse 75, temperature 98.5 F (36.9 C), temperature source Oral, resp. rate 18, height 5\' 10"  (1.778 m), weight 190 lb (86.183 kg), SpO2 93 %.  The patient had no acute events since last update.  Calm and cooperative at this time.  Disposition is pending per Psychiatry/Behavioral Medicine team recommendations. Patient was given magnesium and additional potassium last evening. Will recheck potassium this morning.     Irean HongJade J Sung, MD 05/31/15 [phone]

## 2015-05-31 NOTE — BH Assessment (Signed)
Writer contacted Group Home Owner, Michelle Piperlizhaer [phone]) but was unable to reach anyone and the "voicemail box was full." Writer was unable to leave a message requeting a return phone call.   Writer was unable to inform Group Home of the patient being transferred to [facility].

## 2015-05-31 NOTE — ED Provider Notes (Signed)
Progress note  1:08 PM Patient's potassium was still low this morning. Patient was given an additional 40 mEq of potassium which makes a total of almost 120 mEq, and we'll get a repeat potassium at this time.  3:50 PM Patient's potassium is finally up to 3.5. [facility] be [facility].  Leona CarryLinda M Munira Polson, MD 05/31/15 [phone]

## 2015-05-31 NOTE — BH Assessment (Signed)
Updated labs faxed to [facility] ([phone]) and confirmed they were received.

## 2015-05-31 NOTE — BH Assessment (Signed)
Writer spoke with [facility] ([phone]), informed her of the patients has a history of his potassium is usually low and his getting medication for it while in the ER.  Once labs are collected information to be forwarded to them.

## 2015-06-23 ENCOUNTER — Emergency Department
Admission: EM | Admit: 2015-06-23 | Discharge: 2015-06-23 | Disposition: A | Discharge status: Home / Self Care | Payer: Medicaid Other | Attending: Emergency Medicine | Admitting: Emergency Medicine

## 2015-06-23 DIAGNOSIS — F319 Bipolar disorder, unspecified: Secondary | ICD-10-CM | POA: Diagnosis not present

## 2015-06-23 DIAGNOSIS — Z79899 Other long term (current) drug therapy: Secondary | ICD-10-CM | POA: Diagnosis not present

## 2015-06-23 DIAGNOSIS — L97509 Non-pressure chronic ulcer of other part of unspecified foot with unspecified severity: Secondary | ICD-10-CM | POA: Insufficient documentation

## 2015-06-23 DIAGNOSIS — F200 Paranoid schizophrenia: Secondary | ICD-10-CM | POA: Diagnosis not present

## 2015-06-23 DIAGNOSIS — Z794 Long term (current) use of insulin: Secondary | ICD-10-CM | POA: Insufficient documentation

## 2015-06-23 DIAGNOSIS — E08621 Diabetes mellitus due to underlying condition with foot ulcer: Secondary | ICD-10-CM | POA: Diagnosis not present

## 2015-06-23 DIAGNOSIS — Z7952 Long term (current) use of systemic steroids: Secondary | ICD-10-CM | POA: Diagnosis not present

## 2015-06-23 DIAGNOSIS — J440 Chronic obstructive pulmonary disease with acute lower respiratory infection: Secondary | ICD-10-CM | POA: Insufficient documentation

## 2015-06-23 DIAGNOSIS — Z87891 Personal history of nicotine dependence: Secondary | ICD-10-CM | POA: Diagnosis not present

## 2015-06-23 DIAGNOSIS — R531 Weakness: Secondary | ICD-10-CM | POA: Diagnosis present

## 2015-06-23 DIAGNOSIS — I10 Essential (primary) hypertension: Secondary | ICD-10-CM | POA: Diagnosis not present

## 2015-06-23 LAB — URINALYSIS COMPLETE WITH MICROSCOPIC (ARMC ONLY)
BACTERIA UA: NONE SEEN
BILIRUBIN URINE: NEGATIVE
Glucose, UA: NEGATIVE mg/dL
HGB URINE DIPSTICK: NEGATIVE
Ketones, ur: NEGATIVE mg/dL
LEUKOCYTES UA: NEGATIVE
NITRITE: NEGATIVE
PH: 7 (ref 5.0–8.0)
PROTEIN: NEGATIVE mg/dL
RBC / HPF: NONE SEEN RBC/hpf (ref 0–5)
SQUAMOUS EPITHELIAL / LPF: NONE SEEN
Specific Gravity, Urine: 1.004 — ABNORMAL LOW (ref 1.005–1.030)

## 2015-06-23 LAB — CBC WITH DIFFERENTIAL/PLATELET
BASOS ABS: 0.1 10*3/uL (ref 0–0.1)
BASOS PCT: 1 %
EOS ABS: 0.1 10*3/uL (ref 0–0.7)
EOS PCT: 2 %
HEMATOCRIT: 36.9 % — AB (ref 40.0–52.0)
Hemoglobin: 12.3 g/dL — ABNORMAL LOW (ref 13.0–18.0)
Lymphocytes Relative: 22 %
Lymphs Abs: 1.7 10*3/uL (ref 1.0–3.6)
MCH: 26.8 pg (ref 26.0–34.0)
MCHC: 33.2 g/dL (ref 32.0–36.0)
MCV: 80.7 fL (ref 80.0–100.0)
MONO ABS: 0.7 10*3/uL (ref 0.2–1.0)
Monocytes Relative: 9 %
Neutro Abs: 4.8 10*3/uL (ref 1.4–6.5)
Neutrophils Relative %: 66 %
Platelets: 246 10*3/uL (ref 150–440)
RBC: 4.58 MIL/uL (ref 4.40–5.90)
RDW: 17.1 % — AB (ref 11.5–14.5)
WBC: 7.4 10*3/uL (ref 3.8–10.6)

## 2015-06-23 LAB — URINE DRUG SCREEN, QUALITATIVE (ARMC ONLY)
Amphetamines, Ur Screen: NOT DETECTED
BARBITURATES, UR SCREEN: NOT DETECTED
BENZODIAZEPINE, UR SCRN: NOT DETECTED
CANNABINOID 50 NG, UR ~~LOC~~: NOT DETECTED
Cocaine Metabolite,Ur ~~LOC~~: NOT DETECTED
MDMA (Ecstasy)Ur Screen: NOT DETECTED
METHADONE SCREEN, URINE: NOT DETECTED
Opiate, Ur Screen: NOT DETECTED
Phencyclidine (PCP) Ur S: NOT DETECTED
TRICYCLIC, UR SCREEN: NOT DETECTED

## 2015-06-23 LAB — COMPREHENSIVE METABOLIC PANEL
ALT: 10 U/L — ABNORMAL LOW (ref 17–63)
AST: 17 U/L (ref 15–41)
Albumin: 3.8 g/dL (ref 3.5–5.0)
Alkaline Phosphatase: 65 U/L (ref 38–126)
Anion gap: 8 (ref 5–15)
BILIRUBIN TOTAL: 0.9 mg/dL (ref 0.3–1.2)
CO2: 30 mmol/L (ref 22–32)
Calcium: 8.6 mg/dL — ABNORMAL LOW (ref 8.9–10.3)
Chloride: 97 mmol/L — ABNORMAL LOW (ref 101–111)
Creatinine, Ser: 0.9 mg/dL (ref 0.61–1.24)
GFR calc Af Amer: >60 mL/min (ref >60)
GFR calc non Af Amer: >60 mL/min (ref >60)
GLUCOSE: 110 mg/dL — AB (ref 65–99)
POTASSIUM: 3 mmol/L — AB (ref 3.5–5.1)
SODIUM: 135 mmol/L (ref 135–145)
TOTAL PROTEIN: 6.5 g/dL (ref 6.5–8.1)

## 2015-06-23 LAB — ETHANOL

## 2015-06-23 LAB — TROPONIN I: Troponin I: 0.03 ng/mL (ref <0.031)

## 2015-06-23 MED ORDER — SODIUM CHLORIDE 0.9 % IV SOLN
1000.0000 mL | Freq: Once | INTRAVENOUS | Status: AC
  Administered 2015-06-23: 1000 mL via INTRAVENOUS

## 2015-06-23 NOTE — ED Notes (Signed)
Patient given ED tray meal.

## 2015-06-23 NOTE — ED Notes (Signed)
Pt via ems from facility; awakened this morning and said he had no energy, also c/o pain to left side, "all the way down." Pt able to answer questions, but does appear very sleepy. CBG in field 60, pt given some juice. NAD noted.

## 2015-06-23 NOTE — ED Notes (Signed)
Patient states he will need an ambulance for transport upon discharge.

## 2015-06-23 NOTE — ED Notes (Signed)
EMS here for transport

## 2015-06-23 NOTE — Discharge Instructions (Signed)

## 2015-06-23 NOTE — ED Notes (Signed)
Unable to call report to [facility] due to home not answering the phone.
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
## 2015-07-08 ENCOUNTER — Emergency Department: Payer: Medicaid Other

## 2015-07-08 ENCOUNTER — Emergency Department
Admission: EM | Admit: 2015-07-08 | Discharge: 2015-07-09 | Disposition: A | Discharge status: Other Acute Inpatient Hospital | Payer: Medicaid Other | Attending: Emergency Medicine | Admitting: Emergency Medicine

## 2015-07-08 ENCOUNTER — Encounter: Payer: Self-pay | Admitting: Emergency Medicine

## 2015-07-08 DIAGNOSIS — E1143 Type 2 diabetes mellitus with diabetic autonomic (poly)neuropathy: Secondary | ICD-10-CM | POA: Insufficient documentation

## 2015-07-08 DIAGNOSIS — Z89512 Acquired absence of left leg below knee: Secondary | ICD-10-CM | POA: Insufficient documentation

## 2015-07-08 DIAGNOSIS — Z89511 Acquired absence of right leg below knee: Secondary | ICD-10-CM | POA: Insufficient documentation

## 2015-07-08 DIAGNOSIS — R238 Other skin changes: Secondary | ICD-10-CM

## 2015-07-08 DIAGNOSIS — J449 Chronic obstructive pulmonary disease, unspecified: Secondary | ICD-10-CM | POA: Insufficient documentation

## 2015-07-08 DIAGNOSIS — F061 Catatonic disorder due to known physiological condition: Secondary | ICD-10-CM | POA: Insufficient documentation

## 2015-07-08 DIAGNOSIS — M1990 Unspecified osteoarthritis, unspecified site: Secondary | ICD-10-CM | POA: Diagnosis not present

## 2015-07-08 DIAGNOSIS — E271 Primary adrenocortical insufficiency: Secondary | ICD-10-CM | POA: Insufficient documentation

## 2015-07-08 DIAGNOSIS — I10 Essential (primary) hypertension: Secondary | ICD-10-CM | POA: Diagnosis not present

## 2015-07-08 DIAGNOSIS — Z7951 Long term (current) use of inhaled steroids: Secondary | ICD-10-CM | POA: Diagnosis not present

## 2015-07-08 DIAGNOSIS — Z7952 Long term (current) use of systemic steroids: Secondary | ICD-10-CM | POA: Insufficient documentation

## 2015-07-08 DIAGNOSIS — Z79899 Other long term (current) drug therapy: Secondary | ICD-10-CM | POA: Diagnosis not present

## 2015-07-08 DIAGNOSIS — F333 Major depressive disorder, recurrent, severe with psychotic symptoms: Secondary | ICD-10-CM | POA: Diagnosis present

## 2015-07-08 DIAGNOSIS — Z87891 Personal history of nicotine dependence: Secondary | ICD-10-CM | POA: Insufficient documentation

## 2015-07-08 DIAGNOSIS — M869 Osteomyelitis, unspecified: Secondary | ICD-10-CM | POA: Diagnosis not present

## 2015-07-08 DIAGNOSIS — F200 Paranoid schizophrenia: Secondary | ICD-10-CM | POA: Insufficient documentation

## 2015-07-08 DIAGNOSIS — L909 Atrophic disorder of skin, unspecified: Secondary | ICD-10-CM

## 2015-07-08 DIAGNOSIS — Z794 Long term (current) use of insulin: Secondary | ICD-10-CM | POA: Diagnosis not present

## 2015-07-08 DIAGNOSIS — R531 Weakness: Secondary | ICD-10-CM | POA: Diagnosis not present

## 2015-07-08 DIAGNOSIS — F319 Bipolar disorder, unspecified: Secondary | ICD-10-CM | POA: Insufficient documentation

## 2015-07-08 LAB — CBC WITH DIFFERENTIAL/PLATELET
Basophils Absolute: 0 10*3/uL (ref 0–0.1)
Basophils Relative: 0 %
EOS ABS: 0.1 10*3/uL (ref 0–0.7)
HCT: 37.1 % — ABNORMAL LOW (ref 40.0–52.0)
Hemoglobin: 12.2 g/dL — ABNORMAL LOW (ref 13.0–18.0)
LYMPHS ABS: 0.5 10*3/uL — AB (ref 1.0–3.6)
Lymphocytes Relative: 8 %
MCH: 26.4 pg (ref 26.0–34.0)
MCHC: 33 g/dL (ref 32.0–36.0)
MCV: 80 fL (ref 80.0–100.0)
Monocytes Absolute: 0.2 10*3/uL (ref 0.2–1.0)
Neutro Abs: 5.5 10*3/uL (ref 1.4–6.5)
Neutrophils Relative %: 87 %
PLATELETS: 240 10*3/uL (ref 150–440)
RBC: 4.64 MIL/uL (ref 4.40–5.90)
RDW: 17.2 % — ABNORMAL HIGH (ref 11.5–14.5)
WBC: 6.3 10*3/uL (ref 3.8–10.6)

## 2015-07-08 LAB — COMPREHENSIVE METABOLIC PANEL
ALK PHOS: 58 U/L (ref 38–126)
ALT: 12 U/L — AB (ref 17–63)
AST: 27 U/L (ref 15–41)
Albumin: 3.9 g/dL (ref 3.5–5.0)
Anion gap: 9 (ref 5–15)
BUN: 10 mg/dL (ref 6–20)
CALCIUM: 8.5 mg/dL — AB (ref 8.9–10.3)
CHLORIDE: 95 mmol/L — AB (ref 101–111)
CO2: 31 mmol/L (ref 22–32)
CREATININE: 0.79 mg/dL (ref 0.61–1.24)
Glucose, Bld: 116 mg/dL — ABNORMAL HIGH (ref 65–99)
Potassium: 4.1 mmol/L (ref 3.5–5.1)
Sodium: 135 mmol/L (ref 135–145)
Total Bilirubin: 1 mg/dL (ref 0.3–1.2)
Total Protein: 6.6 g/dL (ref 6.5–8.1)

## 2015-07-08 LAB — URINE DRUG SCREEN, QUALITATIVE (ARMC ONLY)
AMPHETAMINES, UR SCREEN: NOT DETECTED
Barbiturates, Ur Screen: NOT DETECTED
Benzodiazepine, Ur Scrn: POSITIVE — AB
Cannabinoid 50 Ng, Ur ~~LOC~~: NOT DETECTED
Cocaine Metabolite,Ur ~~LOC~~: NOT DETECTED
MDMA (ECSTASY) UR SCREEN: NOT DETECTED
Methadone Scn, Ur: NOT DETECTED
Opiate, Ur Screen: NOT DETECTED
PHENCYCLIDINE (PCP) UR S: NOT DETECTED
Tricyclic, Ur Screen: NOT DETECTED

## 2015-07-08 LAB — URINALYSIS COMPLETE WITH MICROSCOPIC (ARMC ONLY)
BILIRUBIN URINE: NEGATIVE
Bacteria, UA: NONE SEEN
Glucose, UA: NEGATIVE mg/dL
Hgb urine dipstick: NEGATIVE
Leukocytes, UA: NEGATIVE
Nitrite: NEGATIVE
PROTEIN: NEGATIVE mg/dL
Specific Gravity, Urine: 1.012 (ref 1.005–1.030)
Squamous Epithelial / LPF: NONE SEEN
pH: 6 (ref 5.0–8.0)

## 2015-07-08 LAB — TROPONIN I: Troponin I: <0.03 ng/mL (ref <0.031)

## 2015-07-08 LAB — LACTIC ACID, PLASMA: Lactic Acid, Venous: 0.8 mmol/L (ref 0.5–2.0)

## 2015-07-08 LAB — LIPASE, BLOOD: LIPASE: 17 U/L (ref 11–51)

## 2015-07-08 LAB — BRAIN NATRIURETIC PEPTIDE: B NATRIURETIC PEPTIDE 5: 160 pg/mL — AB (ref 0.0–100.0)

## 2015-07-08 LAB — CK: CK TOTAL: 119 U/L (ref 49–397)

## 2015-07-08 LAB — SEDIMENTATION RATE: Sed Rate: 7 mm/hr (ref 0–20)

## 2015-07-08 NOTE — BH Assessment (Addendum)
Referral information for Geriatric Placement have been faxed to;    [facility] ([phone]) Pending Review. (Asked if he had open wounds and if he is able to transfer easily),    [facility] [phone] or [phone]),    [facility] [phone]),    [facility] [phone])   [facility] [phone]),    [facility] [phone] or [phone]),    [facility] [phone]),    [facility] [phone]).

## 2015-07-08 NOTE — ED Provider Notes (Signed)
-----------------------------------------   8:05 PM on 07/08/2015 -----------------------------------------  Patient care assumed from Dr. Juliette AlcideMelinda. Patient has been seen by psychiatry, they are currently attempting to arrange placement, [facility] to [facility] for the patient's presumed catatonia/depression. Chest x-ray performed for placement purposes is normal.  Minna AntisKevin Leshon Armistead, MD 07/08/15 2006

## 2015-07-08 NOTE — ED Notes (Signed)
Pt potentially transferring to [facility] for care (unknown date/time). Due to pt being double amputee, [facility] contacted in reference to wheelchair accessible van. [facility] able to transport pt and his personal wheelchair to facility when ready. Chartered certified accountant([facility] [phone])
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
## 2015-07-08 NOTE — ED Notes (Signed)
Ems from [facility] for increasing weakness x 1 week. Pt slow to speak, follows commands weakly, oriented.

## 2015-07-08 NOTE — BHH Counselor (Signed)
Pt has been [facility] due to "higher level of care because we don't provide ECT treatments" - [facility].

## 2015-07-09 ENCOUNTER — Other Ambulatory Visit: Payer: Self-pay | Admitting: Psychiatry

## 2015-07-09 ENCOUNTER — Inpatient Hospital Stay
Admission: AD | Admit: 2015-07-09 | Discharge: 2015-07-15 | DRG: 885 | Disposition: A | Discharge status: Other Acute Inpatient Hospital | Payer: Medicaid Other | Source: Intra-hospital | Attending: Psychiatry | Admitting: Psychiatry

## 2015-07-09 DIAGNOSIS — Z888 Allergy status to other drugs, medicaments and biological substances status: Secondary | ICD-10-CM | POA: Diagnosis not present

## 2015-07-09 DIAGNOSIS — Z803 Family history of malignant neoplasm of breast: Secondary | ICD-10-CM

## 2015-07-09 DIAGNOSIS — R32 Unspecified urinary incontinence: Secondary | ICD-10-CM | POA: Diagnosis present

## 2015-07-09 DIAGNOSIS — Z87820 Personal history of traumatic brain injury: Secondary | ICD-10-CM

## 2015-07-09 DIAGNOSIS — E1143 Type 2 diabetes mellitus with diabetic autonomic (poly)neuropathy: Secondary | ICD-10-CM | POA: Diagnosis present

## 2015-07-09 DIAGNOSIS — R45851 Suicidal ideations: Secondary | ICD-10-CM | POA: Diagnosis present

## 2015-07-09 DIAGNOSIS — Z915 Personal history of self-harm: Secondary | ICD-10-CM | POA: Diagnosis not present

## 2015-07-09 DIAGNOSIS — J449 Chronic obstructive pulmonary disease, unspecified: Secondary | ICD-10-CM | POA: Diagnosis present

## 2015-07-09 DIAGNOSIS — Z79899 Other long term (current) drug therapy: Secondary | ICD-10-CM

## 2015-07-09 DIAGNOSIS — E1169 Type 2 diabetes mellitus with other specified complication: Secondary | ICD-10-CM | POA: Diagnosis present

## 2015-07-09 DIAGNOSIS — I10 Essential (primary) hypertension: Secondary | ICD-10-CM | POA: Diagnosis present

## 2015-07-09 DIAGNOSIS — E1142 Type 2 diabetes mellitus with diabetic polyneuropathy: Secondary | ICD-10-CM | POA: Diagnosis present

## 2015-07-09 DIAGNOSIS — F332 Major depressive disorder, recurrent severe without psychotic features: Secondary | ICD-10-CM | POA: Diagnosis not present

## 2015-07-09 DIAGNOSIS — F061 Catatonic disorder due to known physiological condition: Secondary | ICD-10-CM | POA: Diagnosis present

## 2015-07-09 DIAGNOSIS — Z87891 Personal history of nicotine dependence: Secondary | ICD-10-CM | POA: Diagnosis not present

## 2015-07-09 DIAGNOSIS — H409 Unspecified glaucoma: Secondary | ICD-10-CM | POA: Diagnosis present

## 2015-07-09 DIAGNOSIS — E271 Primary adrenocortical insufficiency: Secondary | ICD-10-CM | POA: Diagnosis present

## 2015-07-09 DIAGNOSIS — G4700 Insomnia, unspecified: Secondary | ICD-10-CM | POA: Diagnosis present

## 2015-07-09 DIAGNOSIS — K219 Gastro-esophageal reflux disease without esophagitis: Secondary | ICD-10-CM | POA: Diagnosis present

## 2015-07-09 DIAGNOSIS — E1140 Type 2 diabetes mellitus with diabetic neuropathy, unspecified: Secondary | ICD-10-CM | POA: Diagnosis present

## 2015-07-09 DIAGNOSIS — Z89512 Acquired absence of left leg below knee: Secondary | ICD-10-CM | POA: Diagnosis not present

## 2015-07-09 DIAGNOSIS — Z801 Family history of malignant neoplasm of trachea, bronchus and lung: Secondary | ICD-10-CM | POA: Diagnosis not present

## 2015-07-09 DIAGNOSIS — Z8711 Personal history of peptic ulcer disease: Secondary | ICD-10-CM | POA: Diagnosis not present

## 2015-07-09 DIAGNOSIS — Z9889 Other specified postprocedural states: Secondary | ICD-10-CM | POA: Diagnosis not present

## 2015-07-09 DIAGNOSIS — F333 Major depressive disorder, recurrent, severe with psychotic symptoms: Principal | ICD-10-CM

## 2015-07-09 DIAGNOSIS — G4733 Obstructive sleep apnea (adult) (pediatric): Secondary | ICD-10-CM | POA: Diagnosis present

## 2015-07-09 DIAGNOSIS — Z89511 Acquired absence of right leg below knee: Secondary | ICD-10-CM | POA: Diagnosis not present

## 2015-07-09 DIAGNOSIS — Z7951 Long term (current) use of inhaled steroids: Secondary | ICD-10-CM

## 2015-07-09 DIAGNOSIS — Z833 Family history of diabetes mellitus: Secondary | ICD-10-CM | POA: Diagnosis not present

## 2015-07-09 DIAGNOSIS — R531 Weakness: Secondary | ICD-10-CM | POA: Diagnosis not present

## 2015-07-09 LAB — LIPID PANEL
CHOL/HDL RATIO: 3.5 ratio
Cholesterol: 154 mg/dL (ref 0–200)
HDL: 44 mg/dL (ref >40)
LDL CALC: 95 mg/dL (ref 0–99)
Triglycerides: 77 mg/dL (ref <150)
VLDL: 15 mg/dL (ref 0–40)

## 2015-07-09 LAB — TSH: TSH: 2.005 u[IU]/mL (ref 0.350–4.500)

## 2015-07-09 MED ORDER — FLUOXETINE HCL 20 MG PO CAPS
60.0000 mg | ORAL_CAPSULE | Freq: Every day | ORAL | Status: DC
  Administered 2015-07-09: 60 mg via ORAL
  Filled 2015-07-09: qty 3

## 2015-07-09 MED ORDER — NICOTINE 14 MG/24HR TD PT24
14.0000 mg | MEDICATED_PATCH | Freq: Every day | TRANSDERMAL | Status: DC
  Administered 2015-07-09: 14 mg via TRANSDERMAL
  Filled 2015-07-09: qty 1

## 2015-07-09 MED ORDER — LORATADINE 10 MG PO TABS
10.0000 mg | ORAL_TABLET | Freq: Every day | ORAL | Status: DC
  Administered 2015-07-09: 10 mg via ORAL
  Filled 2015-07-09: qty 1

## 2015-07-09 MED ORDER — MAGNESIUM OXIDE 400 (241.3 MG) MG PO TABS
400.0000 mg | ORAL_TABLET | Freq: Two times a day (BID) | ORAL | Status: DC
  Administered 2015-07-10 – 2015-07-14 (×9): 400 mg via ORAL
  Filled 2015-07-09 (×10): qty 1

## 2015-07-09 MED ORDER — PANTOPRAZOLE SODIUM 40 MG PO TBEC
40.0000 mg | DELAYED_RELEASE_TABLET | Freq: Every day | ORAL | Status: DC
  Administered 2015-07-09: 40 mg via ORAL
  Filled 2015-07-09: qty 1

## 2015-07-09 MED ORDER — FLUTICASONE PROPIONATE 50 MCG/ACT NA SUSP
2.0000 | Freq: Every day | NASAL | Status: DC
  Administered 2015-07-10 – 2015-07-14 (×5): 2 via NASAL
  Filled 2015-07-09: qty 16

## 2015-07-09 MED ORDER — LORATADINE 10 MG PO TABS
10.0000 mg | ORAL_TABLET | Freq: Every day | ORAL | Status: DC
  Administered 2015-07-10 – 2015-07-14 (×5): 10 mg via ORAL
  Filled 2015-07-09 (×6): qty 1

## 2015-07-09 MED ORDER — HYDRALAZINE HCL 25 MG PO TABS
25.0000 mg | ORAL_TABLET | Freq: Three times a day (TID) | ORAL | Status: DC
  Filled 2015-07-09 (×3): qty 1

## 2015-07-09 MED ORDER — FLUTICASONE PROPIONATE 50 MCG/ACT NA SUSP
2.0000 | Freq: Every day | NASAL | Status: DC
  Administered 2015-07-09: 2 via NASAL
  Filled 2015-07-09: qty 16

## 2015-07-09 MED ORDER — INSULIN DETEMIR 100 UNIT/ML ~~LOC~~ SOLN
15.0000 [IU] | Freq: Every day | SUBCUTANEOUS | Status: DC
  Filled 2015-07-09: qty 0.15

## 2015-07-09 MED ORDER — FLUOXETINE HCL 20 MG PO CAPS
60.0000 mg | ORAL_CAPSULE | Freq: Every day | ORAL | Status: DC
  Administered 2015-07-10 – 2015-07-14 (×5): 60 mg via ORAL
  Filled 2015-07-09 (×6): qty 3

## 2015-07-09 MED ORDER — NICOTINE 14 MG/24HR TD PT24
14.0000 mg | MEDICATED_PATCH | Freq: Every day | TRANSDERMAL | Status: DC
  Administered 2015-07-10 – 2015-07-14 (×5): 14 mg via TRANSDERMAL
  Filled 2015-07-09 (×6): qty 1

## 2015-07-09 MED ORDER — MAGNESIUM HYDROXIDE 400 MG/5ML PO SUSP: 30.0000 mL | Freq: Every day | ORAL | Status: DC | PRN

## 2015-07-09 MED ORDER — PREDNISONE 10 MG PO TABS: 10.0000 mg | ORAL_TABLET | Freq: Every day | ORAL | Status: DC

## 2015-07-09 MED ORDER — CLONAZEPAM 0.5 MG PO TABS
0.5000 mg | ORAL_TABLET | Freq: Three times a day (TID) | ORAL | Status: DC | PRN
  Administered 2015-07-11: 0.5 mg via ORAL
  Filled 2015-07-09: qty 1

## 2015-07-09 MED ORDER — PANTOPRAZOLE SODIUM 40 MG PO TBEC
40.0000 mg | DELAYED_RELEASE_TABLET | Freq: Every day | ORAL | Status: DC
  Administered 2015-07-10 – 2015-07-15 (×6): 40 mg via ORAL
  Filled 2015-07-09 (×6): qty 1

## 2015-07-09 MED ORDER — LURASIDONE HCL 80 MG PO TABS
160.0000 mg | ORAL_TABLET | Freq: Every day | ORAL | Status: DC
  Administered 2015-07-09: 160 mg via ORAL
  Filled 2015-07-09: qty 2

## 2015-07-09 MED ORDER — LISINOPRIL 10 MG PO TABS
10.0000 mg | ORAL_TABLET | Freq: Every day | ORAL | Status: DC
  Administered 2015-07-09: 10 mg via ORAL
  Filled 2015-07-09: qty 1

## 2015-07-09 MED ORDER — HYDRALAZINE HCL 50 MG PO TABS
25.0000 mg | ORAL_TABLET | Freq: Three times a day (TID) | ORAL | Status: DC
  Administered 2015-07-09 – 2015-07-15 (×17): 25 mg via ORAL
  Filled 2015-07-09 (×6): qty 1
  Filled 2015-07-09: qty 2
  Filled 2015-07-09: qty 1
  Filled 2015-07-09: qty 2
  Filled 2015-07-09 (×6): qty 1

## 2015-07-09 MED ORDER — ACETAMINOPHEN 325 MG PO TABS
650.0000 mg | ORAL_TABLET | Freq: Four times a day (QID) | ORAL | Status: DC | PRN
Start: 2015-07-09 — End: 2015-07-15

## 2015-07-09 MED ORDER — PREGABALIN 75 MG PO CAPS
75.0000 mg | ORAL_CAPSULE | Freq: Three times a day (TID) | ORAL | Status: DC
  Administered 2015-07-09: 75 mg via ORAL
  Filled 2015-07-09: qty 1

## 2015-07-09 MED ORDER — METHOHEXITAL SODIUM 100 MG/10ML IV SOSY: 90.0000 mg | PREFILLED_SYRINGE | Freq: Once | INTRAVENOUS | Status: DC

## 2015-07-09 MED ORDER — PREDNISONE 10 MG PO TABS
10.0000 mg | ORAL_TABLET | Freq: Every day | ORAL | Status: DC
  Administered 2015-07-10 – 2015-07-14 (×5): 10 mg via ORAL
  Filled 2015-07-09 (×7): qty 1

## 2015-07-09 MED ORDER — CLONAZEPAM 0.5 MG PO TABS: 0.5000 mg | ORAL_TABLET | Freq: Three times a day (TID) | ORAL | Status: DC | PRN

## 2015-07-09 MED ORDER — MAGNESIUM OXIDE 400 (241.3 MG) MG PO TABS
400.0000 mg | ORAL_TABLET | Freq: Two times a day (BID) | ORAL | Status: DC
  Administered 2015-07-09: 400 mg via ORAL
  Filled 2015-07-09: qty 1

## 2015-07-09 MED ORDER — LURASIDONE HCL 40 MG PO TABS
160.0000 mg | ORAL_TABLET | Freq: Every day | ORAL | Status: DC
  Administered 2015-07-10 – 2015-07-14 (×5): 160 mg via ORAL
  Filled 2015-07-09 (×5): qty 4

## 2015-07-09 MED ORDER — PREGABALIN 75 MG PO CAPS
75.0000 mg | ORAL_CAPSULE | Freq: Three times a day (TID) | ORAL | Status: DC
  Administered 2015-07-09 – 2015-07-14 (×16): 75 mg via ORAL
  Filled 2015-07-09 (×16): qty 1

## 2015-07-09 MED ORDER — SUCCINYLCHOLINE CHLORIDE 20 MG/ML IJ SOLN: 90.0000 mg | Freq: Once | INTRAMUSCULAR | Status: DC

## 2015-07-09 MED ORDER — ALUM & MAG HYDROXIDE-SIMETH 200-200-20 MG/5ML PO SUSP
30.0000 mL | ORAL | Status: DC | PRN
  Filled 2015-07-09: qty 30

## 2015-07-09 MED ORDER — SODIUM CHLORIDE 0.9 % IV SOLN: 250.0000 mL | Freq: Once | INTRAVENOUS | Status: DC

## 2015-07-09 MED ORDER — LISINOPRIL 10 MG PO TABS
10.0000 mg | ORAL_TABLET | Freq: Every day | ORAL | Status: DC
  Administered 2015-07-10 – 2015-07-13 (×4): 10 mg via ORAL
  Filled 2015-07-09 (×4): qty 1

## 2015-07-09 MED ORDER — INSULIN DETEMIR 100 UNIT/ML ~~LOC~~ SOLN
15.0000 [IU] | Freq: Every day | SUBCUTANEOUS | Status: DC
  Administered 2015-07-09: 15 [IU] via SUBCUTANEOUS
  Filled 2015-07-09 (×2): qty 0.15

## 2015-07-09 NOTE — ED Notes (Signed)
Pt moved from room4  to room 22   NAD assessed  Assessment completed  He denies pain

## 2015-07-09 NOTE — ED Notes (Signed)
BEHAVIORAL HEALTH ROUNDING Patient sleeping: No. Patient alert and oriented: yes Behavior appropriate: Yes.  ; If no, describe:  Nutrition and fluids offered: yes Toileting and hygiene offered: Yes  Sitter present: q15 minute observations and security  monitoring Law enforcement present: Yes  ODS  

## 2015-07-09 NOTE — ED Notes (Signed)
BEHAVIORAL HEALTH ROUNDING Patient sleeping: Yes.   Patient alert and oriented: eyes closed  Appears to be asleep Behavior appropriate: Yes.  ; If no, describe:  Nutrition and fluids offered: sleeping Toileting and hygiene offered: sleeping Sitter present: q 15 minute observations and security monitoring Law enforcement present: yes  ODS 

## 2015-07-09 NOTE — [facility] Suicide Risk Assessment (Signed)
[facility]   Nursing information obtained from:    Demographic factors:    Current Mental Status:    Loss Factors:    Historical Factors:    Risk Reduction Factors:     Total Time spent with patient: 1 hour Principal Problem: Severe recurrent major depression without psychosis Diagnosis:   Patient Active Problem List   Diagnosis Date Noted  . 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 Hypokalemia [E87.6] 05/29/2015  . S/P bilateral below knee amputation (HCC) [Z61.096[Z89.512, Z89.511] 05/29/2015  . Severe recurrent major depression with psychotic features (HCC) [F33.3] 05/29/2015  . Noncompliance [Z91.19] 05/29/2015  . Sinus bradycardia [R00.1] 09/07/2014  . Diabetic foot ulcer associated with diabetes mellitus due to underlying condition (HCC) [E45.409[E08.621, L97.509] 06/12/2014  . Status post below knee amputation of left lower extremity (HCC) [W11.914][Z89.512] 04/05/2014  . 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 Diabetic peripheral neuropathy (HCC) [E11.42] 02/14/2014  . Allergic rhinitis [J30.9] 02/14/2014  . GERD (gastroesophageal reflux disease) [K21.9] 02/14/2014  . Constipation [K59.00] 02/14/2014  . Paranoid schizophrenia, chronic condition (HCC) [F20.0] 12/10/2013  . 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 Foot osteomyelitis, left (HCC) [M86.9] 12/05/2013  . Phantom limb pain (HCC) [G54.6] 11/15/2013  . Depression [F32.9] 11/15/2013  . Generalized anxiety disorder [F41.1] 11/15/2013  . Diabetic foot ulcer with osteomyelitis (HCC) [N82.956[E11.621, E11.69, L97.509, M86.9] 09/09/2013  . PTSD (post-traumatic stress disorder) [F43.10] 09/09/2013  . Addison's disease (HCC) [E27.1] 09/22/2011  . Hyponatremia [E87.1] 03/15/2011  . Headache(784.0) [R51] 03/15/2011  . Essential hypertension, benign [I10] 12/08/2010  . Diabetes mellitus with neuropathy (HCC) [E11.40] 12/08/2010  . Osteomyelitis of toe of left foot  (HCC) [M86.9]    Subjective Data: Patient currently denies suicidal ideation. In the past he has had active suicidal behavior and has been passively suicidal. Patient is not eating well. Appears to be failing to thrive. Not actively violent or aggressive. Does appear to be severely depressed.  Continued Clinical Symptoms:  Alcohol Use Disorder Identification Test Final Score (AUDIT): 0 The "Alcohol Use Disorders Identification Test", Guidelines for Use in Primary Care, Second Edition.  World Science writerHealth Organization [facility](WHO). Score between 0-7:  no or low risk or alcohol related problems. Score between 8-15:  moderate risk of alcohol related problems. Score between 16-19:  high risk of alcohol related problems. Score 20 or above:  warrants further diagnostic evaluation for alcohol dependence and treatment.   CLINICAL FACTORS:   Depression:   Severe   Musculoskeletal: Strength & Muscle Tone: Waxy flexibility Gait & Station: unable to stand Patient leans: N/A  Psychiatric Specialty Exam: Physical Exam  ROS  Blood pressure 160/82, pulse 72, temperature 98.4 F (36.9 C), temperature source Oral, resp. rate 18, height 5\' 10"  (1.778 m), weight 86.183 kg (190 lb), SpO2 98 %.Body mass index is 27.26 kg/(m^2).  General Appearance: Disheveled  Eye Contact:  Minimal  Speech:  Slow  Volume:  Decreased  Mood:  Depressed  Affect:  Blunt  Thought Process:  Goal Directed  Orientation:  Full (Time, Place, and Person)  Thought Content:  Tangential  Suicidal Thoughts:  Yes.  without intent/plan  Homicidal Thoughts:  No  Memory:  Immediate;   Fair Recent;   Fair Remote;   Fair  Judgement:  Fair  Insight:  Fair  Psychomotor Activity:  Decreased  Concentration:  Concentration: Poor  Recall:  [employer]  Fund of Knowledge:  Fair  Language:  Fair  Akathisia:  No  Handed:  Right  AIMS (if indicated):     Assets:  Housing Resilience  ADL's:  Impaired  Cognition:  Impaired,  Moderate  Sleep:          COGNITIVE FEATURES THAT CONTRIBUTE TO RISK:  Thought constriction (tunnel vision)    SUICIDE RISK:   Mild:  Suicidal ideation of limited frequency, intensity, duration, and specificity.  There are no identifiable plans, no associated intent, mild dysphoria and related symptoms, good self-control (both objective and subjective assessment), few other risk factors, and identifiable protective factors, including available and accessible social support.  PLAN OF CARE: Patient is currently at low risk of suicide although as he gets more energetic the risk will probably go up. Patient is going to be treated with ECT as well as continuing his current antidepressant medicine. Continue medicines for medical problems. Patient is agreeable to the plan.  I certify that inpatient services furnished can reasonably be expected to improve the patient's condition.   Mordecai Rasmussen, MD 07/09/2015, 6:56 PM
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
## 2015-07-09 NOTE — ED Notes (Signed)

## 2015-07-09 NOTE — ED Provider Notes (Signed)
-----------------------------------------   6:33 AM on 07/09/2015 -----------------------------------------   Blood pressure 185/86, pulse 57, temperature 98.5 F (36.9 C), temperature source Oral, resp. rate 12, weight 190 lb (86.183 kg), SpO2 92 %.  The patient had no acute events since last update.  Calm and cooperative at this time.  [facility].   Irean HongJade J Keanu Frickey, MD 07/09/15 [phone]

## 2015-07-09 NOTE — ED Notes (Signed)
ED BHU PLACEMENT JUSTIFICATION Is the patient under IVC or is there intent for IVC: no Is the patient medically cleared: Yes.   Is there vacancy in the ED BHU:  no  Is the population mix appropriate for patient:  No  Bilateral lower extremity amputee Is the patient awaiting placement in inpatient or outpatient setting:    Has the patient had a psychiatric consult: Yes.   Reevaluation pending today   Survey of unit performed for contraband, proper placement and condition of furniture, tampering with fixtures in bathroom, shower, and each patient room: Yes.  ; Findings:  APPEARANCE/BEHAVIOR Calm and cooperative NEURO ASSESSMENT Orientation: oriented to self and place   Denies pain Hallucinations: No.None noted (Hallucinations) Speech: Normal Gait: normal RESPIRATORY ASSESSMENT Even  Unlabored respirations  CARDIOVASCULAR ASSESSMENT Pulses equal   regular rate  Skin warm and dry   GASTROINTESTINAL ASSESSMENT no GI complaint EXTREMITIES Full ROM  PLAN OF CARE Provide calm/safe environment. Vital signs assessed twice daily. ED BHU Assessment once each 12-hour shift. Collaborate with intake RN daily or as condition indicates. Assure the ED provider has rounded once each shift. Provide and encourage hygiene. Provide redirection as needed. Assess for escalating behavior; address immediately and inform ED provider.  Assess family dynamic and appropriateness for visitation as needed: Yes.  ; If necessary, describe findings:  Educate the patient/family about BHU procedures/visitation: Yes.  ; If necessary, describe findings:

## 2015-07-09 NOTE — BH Assessment (Signed)
[facility] by Dr. Toni Amendlapacs.  Attending Physician will be Dr. Ardyth HarpsHernandez.   [facility] Marylu LundJanet.   Intake Paper Work has been signed and placed on patient chart.  ER staff is aware of the admission [place](Luanne, ER Sect.; Dr. Darnelle CatalanMalinda, ER MD; Amy T. Patient's Nurse & Aleene DavidsonUrsla, Patient Access).
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
## 2015-07-09 NOTE — ED Notes (Signed)
[facility]  Pt to transfer at this time
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
## 2015-07-10 LAB — GLUCOSE, CAPILLARY
GLUCOSE-CAPILLARY: 199 mg/dL — AB (ref 65–99)
GLUCOSE-CAPILLARY: 76 mg/dL (ref 65–99)
Glucose-Capillary: 39 mg/dL — CL (ref 65–99)
Glucose-Capillary: 121 mg/dL — ABNORMAL HIGH (ref 65–99)

## 2015-07-10 LAB — HEMOGLOBIN A1C: HEMOGLOBIN A1C: 6.1 % — AB (ref 4.0–6.0)

## 2015-07-10 MED ORDER — LOPERAMIDE HCL 2 MG PO CAPS
2.0000 mg | ORAL_CAPSULE | ORAL | Status: DC | PRN
  Administered 2015-07-10: 2 mg via ORAL
  Filled 2015-07-10: qty 1

## 2015-07-10 NOTE — Progress Notes (Signed)
Patient was calm & cooperative.His affect is sad & depressed.Denies suicidal or homicidal ideations and AV hallucinations.Patient was up in the wheelchair with 2 assistance.Incontinent care given.Compliant with medications.

## 2015-07-10 NOTE — Tx Team (Signed)
Interdisciplinary Treatment Plan Update (Adult)        Date: 07/10/2015   Time Reviewed: 9:30 AM   Progress in Treatment: Improving  Attending groups: Continuing to assess, patient new to milieu  Participating in groups: Continuing to assess, patient new to milieu  Taking medication as prescribed: Yes  Tolerating medication: Yes  Family/Significant other contact made: No, CSW assessing for appropriate contacts  Patient understands diagnosis: Yes  Discussing patient identified problems/goals with staff: Yes  Medical problems stabilized or resolved: Yes  Denies suicidal/homicidal ideation: Yes  Issues/concerns per patient self-inventory: Yes  Other:   New problem(s) identified: N/A   Discharge Plan or Barriers: CSW continuing to assess, patient new to milieu.   Reason for Continuation of Hospitalization:   Depression   Anxiety   Medication Stabilization   Comments: N/A   Estimated length of stay: 3-5 days     Patient is a 60 year old man with a history of recurrent severe depression with psychotic features who comes into the hospital initially referred from his group home because of weakness. Patient was seen in the emergency room and evaluated by emergency room physicians who could find no acute cause for it. On examination the patient demonstrates waxy flexibility. Flat affect. Very withdrawn speech very limited thinking. Patient is vague about whether he is depressed. Vague about suicidal ideation. Denies any hallucinations. Says he's been compliant with his medicine. On the other hand he says that his weakness has been getting worse and worse ever since he got out of [facility] more hopeless and withdrawn. He claims he is compliant with medicine. No evidence of substance abusePatient [place], [place]. Patient will benefit from crisis stabilization, medication evaluation, group therapy, and psycho education in addition to case management for discharge  planning. Patient and CSW reviewed pt's identified goals and treatment plan. Pt verbalized understanding and agreed to treatment plan.    Review of initial/current patient goals per problem list:  1. Goal(s): Patient will participate in aftercare plan   Met: Yes  Target date: 3-5 days post admission date   As evidenced by: Patient will participate within aftercare plan AEB aftercare provider and housing plan at discharge being identified.   5/26: Pt will discharge to his group home, [facility] and will follow up with [facility] for for medication management and therapy    2. Goal (s): Patient will exhibit decreased depressive symptoms and suicidal ideations.   Met: No  Target date: 3-5 days post admission date   As evidenced by: Patient will utilize self-rating of depression at 3 or below and demonstrate decreased signs of depression or be deemed stable for discharge by MD.  5/26: Goal progressing.     3. Goal(s): Patient will demonstrate decreased signs and symptoms of anxiety.   Met: No  Target date: 3-5 days post admission date   As evidenced by: Patient will utilize self-rating of anxiety at 3 or below and demonstrated decreased signs of anxiety, or be deemed stable for discharge by MD   5/26: Goal progressing.     4. Goal(s): Patient will demonstrate decreased signs of psychosis  * Met: No * Target date: 3-5 days post admission date  * As evidenced by: Patient will demonstrate decreased frequency of AVH or return to baseline function   5/26: Pt denies AVH      Attendees:  Patient:  Family:  Physician: Dr. Jerilee Hoh, MD    07/10/2015 9:30 AM  Nursing: Terressa Koyanagi, RN  07/10/2015 9:30 AM  Clinical Social Worker: Marylou Flesher, Latanya Presser  07/10/2015 9:30 AM  Nursing: Geanie Berlin, RN     07/10/2015 9:30 AM  Nursing: Lucile Shutters, RN     07/10/2015 9:30 AM  Recreational Therapist: Everitt Amber, LRT  07/10/2015 9:30 AM  Other:        07/10/2015 9:30 AM     Marylou Flesher, LCSWA, LCAS  07/10/15

## 2015-07-10 NOTE — Progress Notes (Signed)
D: Patient appears depressed. He is very minimal in his speech. Needs assistance with ADLs. When asked how he feels about ECT he stated, "it's okay." He denies SI/HI/AVH. He denies pain. Patient is currently in pampers and has to be changed of both BMs and Urination but can tell staff when it is time for that. Patient complaining of stomach problems and diarrhea. Patient states he takes imodium at home.   A: Medication was given with education. Encouragement was provided. Patient was educated to be NPO after midnight. PRN imodium was ordered.  R: Patient was compliant with medication. He has remained calm and cooperative. Safety maintained with 15 min checks.

## 2015-07-10 NOTE — Progress Notes (Signed)
Hypoglycemic Event  CBG: 39  Treatment: Orange Juice   Symptoms: Weak, "woozy"  Follow-up CBG: Time: 0630 CBG Result:79  Possible Reasons for Event: Other: NPO after midnight   Comments/MD notified: Clapacs notified at 0615. ECT cancelled and orange juice given.     Lendell Capriceasey N Katy Brickell

## 2015-07-10 NOTE — Tx Team (Signed)
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  The patient and family have been given the opportunity to ask questions and make suggestions.  [name] 07/10/2015, 11:08 AM

## 2015-07-10 NOTE — [facility] Group Notes (Signed)
[facility] Group Notes:  (Nursing/MHT/Case Management/Adjunct)  Date:  07/10/2015  Time:  4:12 PM  Type of Therapy:  Psychoeducational Skills  Participation Level:  Did Not Attend    [name] 07/10/2015, 4:12 PM

## 2015-07-10 NOTE — [facility] Group Notes (Signed)
[facility] Group Notes:  (Nursing/MHT/Case Management/Adjunct)  Date:  07/10/2015  Time:  1:10 AM  Type of Therapy:  Psychoeducational Skills  Participation Level:  Did Not Attend  Summary of Progress/Problems:  Gregory MilroyLaquanda Y Aftin [name] 07/10/2015, 1:10 AM

## 2015-07-10 NOTE — BHH Counselor (Deleted)
Adult Comprehensive Assessment  Patient ID: Junious DresserDouglas E Colocho, male   DOB: 01/29/1956, 60 y.o.   MRN: [id number]  Information Source:    Current Stressors:     Living/Environment/Situation:  Living Arrangements: Group Home  Family History:  Marital status: Single  Childhood History:     Education:  Highest grade of school patient has completed: Unknown at this time Name of school: Unknown at this time Contact person: n/a  Employment/Work Situation:      Surveyor, quantityinancial Resources:      Alcohol/Substance Abuse:   What has been your use of drugs/alcohol within the last 12 months?: Reports of none  Social Support System:      Leisure/Recreation:      Strengths/Needs:      Discharge Plan:      Summary/Recommendations:      Dorothe PeaJonathan F Jaysen Wey. 07/10/2015

## 2015-07-10 NOTE — [facility] Suicide Risk Assessment (Signed)
[facility] INPATIENT:  Family/Significant Other Suicide Prevention Education  Suicide Prevention Education:  Patient Refusal for Family/Significant Other Suicide Prevention Education: The patient [name] has refused to provide written consent for family/significant other to be provided Family/Significant Other Suicide Prevention Education during admission and/or prior to discharge.  Physician notified.  Pt refused SPE from the CSW.  [name] 07/10/2015, 3:51 PM

## 2015-07-10 NOTE — [facility] Group Notes (Signed)
[facility] LCSW Group Therapy  07/10/2015 1:54 PM  Type of Therapy:  Group Therapy  Participation Level:  Minimal  Participation Quality:  Appropriate  Affect:  Flat  Cognitive:  Alert  Insight:  Limited  Engagement in Therapy:  Limited  Modes of Intervention:  Discussion, Education, Socialization and Support  Summary of Progress/Problems: Feelings around Relapse. Group members discussed the meaning of relapse and shared personal stories of relapse, how it affected them and others, and how they perceived themselves during this time. Group members were encouraged to identify triggers, warning signs and coping skills used when facing the possibility of relapse. Social supports were discussed and explored in detail.   Sempra EnergyCandace L Markala [name] MSW, LCSWA  07/10/2015, 1:54 PM

## 2015-07-10 NOTE — [facility] Group Notes (Signed)
[facility] Group Notes:  (Nursing/MHT/Case Management/Adjunct)  Date:  07/10/2015  Time:  9:38 PM  Type of Therapy:  Evening Wrap-up Group  Participation Level:  Did Not Attend  Participation Quality:  N/A  Affect:  N/A  Cognitive:  N/A  Insight:  None  Engagement in Group:  N/A  Modes of Intervention:  Discussion  Summary of Progress/Problems:  Tomasita MorrowChelsea Nanta English Craighead 07/10/2015, 9:38 PM
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
## 2015-07-10 NOTE — Progress Notes (Signed)
Recreation Therapy Notes  Date: 05.26.17 Time: 9:30 am Location: Craft Room  Group Topic: Coping Skills  Goal Area(s) Addresses:  Patient will verbalize benefit of using art as a coping skill. Patient will verbalize one emotion experienced during group.  Behavioral Response: Did not attend  Intervention: Coloring  Activity: Patients were given coloring sheets and instructed to color while thinking about what their mind was focused on and what emotions they felt.  Education: LRT educated patient on healthy coping skills.  Education Outcome: Patient did not attend group.  Clinical Observations/Feedback: Patient did not attend group.    Jacquelynn CreeGreene,Shavell Nored M, LRT/CTRS 07/10/2015 11:54 AM

## 2015-07-10 NOTE — Plan of Care (Signed)
Problem: Education: Goal: Knowledge of [facility] Education information/materials will improve Outcome: Progressing Patient was compliant with medications and NPO status

## 2015-07-11 LAB — PROLACTIN: Prolactin: 53.1 ng/mL — ABNORMAL HIGH (ref 4.0–15.2)
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
## 2015-07-11 NOTE — [facility] Group Notes (Signed)
[facility] LCSW Group Therapy  07/11/2015 2:00 PM  Type of Therapy:  Group Therapy  Participation Level:  Did Not Attend  Modes of Intervention:  Discussion, Education, Socialization and Support  Summary of Progress/Problems: Todays topic: Grudges  Patients will be encouraged to discuss their thoughts, feelings, and behaviors as to why one holds on to grudges and reasons why people have grudges. Patients will process the impact of grudges on their daily lives and identify thoughts and feelings related to holding grudges. Patients will identify feelings and thoughts related to what life would look like without grudges.   [name] [name] MSW, LCSWA  07/11/2015, 2:00 PM   

## 2015-07-11 NOTE — Progress Notes (Signed)
Patient with depressed affect, cooperative behavior with meals, meds and plan of care. No SI/HI/AVH at this time. Poor eye contact and patient encouraged to look at Clinical research associatewriter during conversation. Patient does not eat am meal, stating "I don't eat breakfast. Patient turns self and able to scoot up in bed to sit up at 90 degree angle for meals, meds and fluids. Tolerates po fluids well. Incontinent of urine x 1 and cares provided by 1 staff. Unable to transfer patient out of bed rt double amputee. Patient states he has prosthesis at home. Patient dressing to right leg changed. No redness, swelling or drainage at this time. Patient with small open area (0.25 ml) and reports he was supposed to go his primary physician last Friday. Denies pain. Safety maintained.

## 2015-07-12 NOTE — Plan of Care (Signed)
Problem: Coping: Goal: Ability to verbalize frustrations and anger appropriately will improve Outcome: Progressing Patient verbalizes depression scale and anxiety scale when asked [place] RN

## 2015-07-12 NOTE — [facility] Group Notes (Signed)
[facility] Group Notes:  (Nursing/MHT/Case Management/Adjunct)  Date:  07/12/2015  Time:  11:29 PM  Type of Therapy:  Group Therapy  Participation Level:  Did Not Attend    [name] [name] 07/12/2015, 11:29 PM

## 2015-07-12 NOTE — [facility] Group Notes (Signed)
[facility] LCSW Group Therapy  07/12/2015 2:05 PM  Type of Therapy:  Group Therapy  Participation Level:  Did Not Attend  Modes of Intervention:  Discussion, Education, Socialization and Support  Summary of Progress/Problems: Pt will identify unhealthy thoughts and how they impact their emotions and behavior. Pt will be encouraged to discuss these thoughts, emotions and behaviors with the group.    Altariq Goodall L Mirriam Vadala MSW, LCSWA  07/12/2015, 2:05 PM  

## 2015-07-12 NOTE — [facility] Group Notes (Signed)
[facility] Group Notes:  (Nursing/MHT/Case Management/Adjunct)  Date:  07/12/2015  Time:  1:19 AM  Type of Therapy:  Group Therapy  Participation Level:  Did Not Attend   Summary of Progress/Problems:  [name] 07/12/2015, 1:19 AM

## 2015-07-12 NOTE — Progress Notes (Addendum)
D:  Per pt self inventory pt reports sleeping good, appetite fair, energy level low, ability to pay attention good, rates depression at a 1 out of 10, hopelessness at a 1 out of 10, anxiety at a 2 out of 10, denies SI/HI/AVH, goal today: "get in the wheelchair", flat, slow response time during interaction.     A:  Emotional support provided, Encouraged pt to continue with treatment plan and attend all group activities, q15 min checks maintained for safety.  R:  Pt is receptive, not going to groups--pt is bilateral BKA and is unable to transfer--pt states that his group home will bringing his prosthetic legs later this evening during visitation--pt states that he is able to ambulate with them, pleasant and cooperative with staff and other patients on the unit.
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
## 2015-07-13 MED ORDER — INSULIN ASPART 100 UNIT/ML ~~LOC~~ SOLN
0.0000 [IU] | Freq: Three times a day (TID) | SUBCUTANEOUS | Status: DC
  Administered 2015-07-13: 12 [IU] via SUBCUTANEOUS
  Administered 2015-07-14 (×2): 4 [IU] via SUBCUTANEOUS
  Administered 2015-07-14: 8 [IU] via SUBCUTANEOUS
  Administered 2015-07-15: 2 [IU] via SUBCUTANEOUS
  Filled 2015-07-13: qty 8
  Filled 2015-07-13: qty 2
  Filled 2015-07-13: qty 12
  Filled 2015-07-13 (×2): qty 4

## 2015-07-13 MED ORDER — INSULIN DETEMIR 100 UNIT/ML ~~LOC~~ SOLN
15.0000 [IU] | Freq: Every day | SUBCUTANEOUS | Status: DC
  Filled 2015-07-13 (×3): qty 0.15

## 2015-07-13 MED ORDER — LISINOPRIL 10 MG PO TABS
20.0000 mg | ORAL_TABLET | Freq: Every day | ORAL | Status: DC
  Administered 2015-07-14 – 2015-07-15 (×2): 20 mg via ORAL
  Filled 2015-07-13 (×3): qty 2

## 2015-07-13 MED ORDER — CLONIDINE HCL 0.1 MG PO TABS: 0.1000 mg | ORAL_TABLET | Freq: Three times a day (TID) | ORAL | Status: DC | PRN

## 2015-07-13 MED ORDER — INSULIN ASPART 100 UNIT/ML ~~LOC~~ SOLN: 0.0000 [IU] | SUBCUTANEOUS | Status: DC

## 2015-07-13 NOTE — Progress Notes (Signed)
Notified Dr. Alvester MorinBell that patient's BP has been running high, last BP 176/79, no new orders given at this time but Dr. Alvester MorinBell will review patients chart.

## 2015-07-13 NOTE — Progress Notes (Signed)
Recreation Therapy Notes  Date: 05.29.17 Time: 11:00 am Location: Craft Room  Group Topic: Self-expression  Goal Area(s) Addresses:  Patient will identify one color per emotion listed on wheel. Patient will verbalize benefit of using art as a means of self-expression. Patient will verbalize one emotion experienced during session. Patient will be educated on other forms of self-expression.  Behavioral Response: Did not attend  Intervention: Emotion Wheel  Activity: Patients were given an Emotion Wheel worksheet and instructed to pick a color for each emotion listed.   Education: LRT educated patients on other forms of self-expression.  Education Outcome: Patient did not attend group.  Clinical Observations/Feedback: Patient did not attend group.  Jacquelynn CreeGreene,Arneisha Kincannon M, LRT/CTRS 07/13/2015 11:51 AM

## 2015-07-13 NOTE — Consult Note (Signed)
WOC wound consult note Reason for Consult: Nonintact lesion to right BKA site.  Patient wears prosthesis.  WIll order a moisture retentive dressing that will allow for prosthesis to be worn.  (Family member is bringing them in). Wound type:Device related pressure injury from right prosthesis Pressure Ulcer POA: Yes Measurement: 1 cm diameter nonintact lesion Wound ZOX:WRUEbed:pink moist Drainage (amount, consistency, odor) Minimal serosanguinous  No odor.  Periwound:Intact Dressing procedure/placement/frequency:Cleanse wound to right BKA surgical site with soap and water. Apply silicone border foam dressing to protect and allow prosthesis to be applied.  Change every other day and PRN soilage.  Will not follow at this time.  Please re-consult if needed.  Maple HudsonKaren Angelin Cutrone RN BSN CWON Pager [phone]

## 2015-07-13 NOTE — Evaluation (Signed)
Physical Therapy Evaluation Patient Details Name: CLEMON DEVAUL MRN: [id number] DOB: [birth year] Today's Date: 07/13/2015   History of Present Illness  This a 60 year old man with a history of recurrent severe depression with psychotic features who comes into the hospital initially referred from his group home because of weakness. Patient was seen in the emergency room and evaluated by emergency room physicians who could find no acute cause for it. On examination the patient demonstrates waxy flexibility. Flat affect. Very withdrawn speech very limited thinking. Patient is vague about whether he is depressed. Vague about suicidal ideation. Denies any hallucinations. Says he's been compliant with his medicine. On the other hand he says that his weakness has been getting worse and worse ever since he got out of [facility]. Feeling more hopeless and withdrawn. He claims he is compliant with medicine. No evidence of substance abuse  Clinical Impression  Patient presents with decreased mobility for transfers from bed to wc requiring min assist with L prosthesis and lateral scoot. He is able to independently don his left prosthesis . He has a sore on the right LE stump and is not recommended to don the RLE prosthesis with the sore. He is independent with bed mobility and will benefit from skilled PT to improve safety with transfers from bed to wc.    Follow Up Recommendations Home health PT    Equipment Recommendations  None recommended by PT    Recommendations for Other Services       Precautions / Restrictions Precautions Precautions: Fall Required Braces or Orthoses:  (prosthesis) Restrictions Weight Bearing Restrictions: Yes      Mobility  Bed Mobility Overal bed mobility: Independent                Transfers Overall transfer level: Needs assistance Equipment used: Rolling walker (2 wheeled) Transfers: Lateral/Scoot Transfers          Lateral/Scoot Transfers:  Min guard    Ambulation/Gait Ambulation/Gait assistance:  (unable to ambulate)              Careers information officer    Modified Rankin (Stroke Patients Only)       Balance                                             Pertinent Vitals/Pain Pain Assessment: No/denies pain    Home Living                        Prior Function                 Hand Dominance        Extremity/Trunk Assessment                         Communication      Cognition Arousal/Alertness: Lethargic Behavior During Therapy: Flat affect                        General Comments General comments (skin integrity, edema, etc.):  (R stump pressure ulcer with dressing)    Exercises        Assessment/Plan    PT Assessment    PT Diagnosis     PT Problem List    PT Treatment  Interventions     PT Goals (Current goals can be found in the Care Plan section) Acute Rehab PT Goals Patient Stated Goal: to get his prosthesis on his legs    Frequency Min 2X/week   Barriers to discharge        Co-evaluation               End of Session Equipment Utilized During Treatment: Gait belt   Patient left:  (wc)           Time: 1500-1530 PT Time Calculation (min) (ACUTE ONLY): 30 min   Charges:   PT Evaluation $PT Eval Low Complexity: 1 Procedure PT Treatments $Therapeutic Activity: 8-22 mins   PT G Codes:       Ezekiel InaKristine S Janette Harvie, PT, DPT [place], Barkley BrunsKristine S 07/13/2015, 3:43 PM

## 2015-07-13 NOTE — Plan of Care (Signed)
Problem: Education: Goal: Knowledge of the prescribed therapeutic regimen will improve Outcome: Progressing Pt was knowledgeable about medication regimen when asked.

## 2015-07-13 NOTE — [facility] Group Notes (Signed)
[facility] LCSW Group Therapy   07/13/2015 9:30 am  Type of Therapy: Group Therapy   Participation Level: Did Not Attend. Patient invited to participate but declined.    Tyanne Derocher F. Jaylianna Tatlock, MSW, LCSWA, LCAS   

## 2015-07-13 NOTE — Progress Notes (Signed)
D:  Per pt self inventory pt reports sleeping good, appetite good, energy level low, ability to pay attention , rates depression at a  out of 10, hopelessness at a out of 10, anxiety at a out of 10, denies SI/HI/AVH, goal today: "Use prosthetic limbs", flat, response lag, appropriate during interaction, pt was able to use the urinal and call for staff assistance when he needs to urinate.     A:  Emotional support provided, Encouraged pt to continue with treatment plan and attend all group activities, q15 min checks maintained for safety, called PT to ask about when pt will have eval done and there was no answer, also notified Dr. Alvester MorinBell today that patient's lunchtime CBG was 308, no s/s.  R:  Pt is receptive, not going to groups--because he is unable to transfer safely to the wheelchair, pleasant and cooperative with staff and other patients on the unit.
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
## 2015-07-13 NOTE — Progress Notes (Signed)
Assisted with urinal.  Patient is in wheelchair and out on unit. Support offered.

## 2015-07-13 NOTE — [facility] Group Notes (Signed)
[facility] Group Notes:  (Nursing/MHT/Case Management/Adjunct)  Date:  07/13/2015  Time:  11:50 PM  Type of Therapy:  Psychoeducational Skills  Participation Level:  Did Not Attend  Summary of Progress/Problems:  Chancy MilroyLaquanda Y Marguarite Markov 07/13/2015, 11:50 PM

## 2015-07-13 NOTE — Progress Notes (Signed)
D: Observed pt in room lying down. Patient alert and oriented x4. Patient denies SI/HI/AVH. Pt affect is anxious and depressed. Pt stated today "has been a good day." Pt rated depression 1/10 and anxiety 4/10. Pt indicated that his anxiety is normally 8-10 and this is "much lower...less stress here than out in the world." Pt had two episodes of incontinence. Pt in bed all evening. Pt needs moderate assistance for incontinence care. Pt able to change position. A: Offered active listening and support. Provided therapeutic communication. Administered scheduled medications. Assisted pt with incontinence care and positional changes. Reinforced education on using bed alarm. R: Pt pleasant and cooperative. Pt compliant with education. Pt makes some positional changes independently, but needs reinforcement. Pt medication compliant. Will continue Q15 min. checks. Safety maintained.

## 2015-07-13 NOTE — Progress Notes (Signed)
PT at bedside for eval.

## 2015-07-13 NOTE — Progress Notes (Signed)
Pt has sliding scale insulin orders for elevated blood sugars. If patient does not eat, do not give sliding scale insulin.  Home dose of QHS insulin re-ordered. Clonidine 0.1 mg BID prn for hypertensive episodes- please document blood pressure prior to giving.   Monitor for signs and symptoms.    Suzan GaribaldiBell,  Alexandru Moorer L MD 07/13/2015

## 2015-07-14 LAB — GLUCOSE, CAPILLARY
GLUCOSE-CAPILLARY: 147 mg/dL — AB (ref 65–99)
GLUCOSE-CAPILLARY: 189 mg/dL — AB (ref 65–99)
GLUCOSE-CAPILLARY: 289 mg/dL — AB (ref 65–99)
GLUCOSE-CAPILLARY: 242 mg/dL — AB (ref 65–99)
Glucose-Capillary: 143 mg/dL — ABNORMAL HIGH (ref 65–99)
Glucose-Capillary: 152 mg/dL — ABNORMAL HIGH (ref 65–99)
Glucose-Capillary: 169 mg/dL — ABNORMAL HIGH (ref 65–99)
Glucose-Capillary: 160 mg/dL — ABNORMAL HIGH (ref 65–99)
Glucose-Capillary: 234 mg/dL — ABNORMAL HIGH (ref 65–99)
Glucose-Capillary: 192 mg/dL — ABNORMAL HIGH (ref 65–99)
Glucose-Capillary: 171 mg/dL — ABNORMAL HIGH (ref 65–99)
Glucose-Capillary: 165 mg/dL — ABNORMAL HIGH (ref 65–99)
Glucose-Capillary: 308 mg/dL — ABNORMAL HIGH (ref 65–99)
Glucose-Capillary: 82 mg/dL (ref 65–99)
Glucose-Capillary: 168 mg/dL — ABNORMAL HIGH (ref 65–99)

## 2015-07-14 NOTE — Progress Notes (Signed)
Calm and cooperative. Sad flat affect. Thought blocking. Rates depression level 5(0low-10worst). Pt states he feels lonely and afraid.  blood sugar 119. Pt refused levemir 15 units states "my blood sugar is too low for insulin". Urinal at bedside. Voids freely. Amber colored urine noted. No foul odor. No s/s of distress noted. Snacks offered and accepted. No behavior issues noted. Will continue to monitor for safety.

## 2015-07-14 NOTE — Plan of Care (Signed)
Problem: Activity: Goal: Interest or engagement in activities will improve Outcome: Progressing Patient was in wheelchair today and wheeled himself to med room and day room for group [place] RN

## 2015-07-14 NOTE — Plan of Care (Signed)
Problem: Coping: Goal: Ability to verbalize frustrations and anger appropriately will improve Outcome: Not Progressing Patient remains depressed. Delay in responding when assessed.

## 2015-07-14 NOTE — [facility] Group Notes (Signed)
[facility] Group Notes:  (Nursing/MHT/Case Management/Adjunct)  Date:  07/14/2015  Time:  2:47 PM  Type of Therapy:  Psychoeducational Skills  Participation Level:  Minimal  Participation Quality:  Attentive and Sharing  Affect:  Flat  Cognitive:  Lacking  Insight:  Limited  Engagement in Group:  Limited  Modes of Intervention:  Discussion and Education  Summary of Progress/Problems:  [name] 07/14/2015, 2:47 PM

## 2015-07-14 NOTE — Progress Notes (Signed)
Recreation Therapy Notes  Date: 05.30.17 Time: 9:30 am Location: Craft Room  Group Topic: Goal Setting  Goal Area(s) Addresses:  Patient will be able to identify one goal. Patient will verbalize benefit of setting goals.  Behavioral Response: Did not attend   Intervention: Step By Step  Activity: Patients were given a foot worksheet and instructed to write a goal inside the foot and write positive comments on the outside of the foot.  Education: LRT educated patients on ways they can celebrate in a healthy way after reaching their goal.  Education Outcome: Patient did not attend group.  Clinical Observations/Feedback: Patient did not attend group.  Jacquelynn CreeGreene,Harsh Trulock M, LRT/CTRS 07/14/2015 10:12 AM

## 2015-07-14 NOTE — Progress Notes (Signed)
Patient with depressed affect, cooperative with meals, meds and plan of care. Patient remains assist x 1 for cares and meal set up. Good appetite. Increased eye contact, quiet speech with a delay in response time. No SI/HI at this time. Blood glucose monitored and recorded. No s/s of hypo/hyperglycemia. Dressing to wound on right leg BKA changed with no s/s of infection. No c/o discomfort. Patient able to wear right prosthesis and stand and pivot into wheelchair with stand by assist. Safety maintained. To dayroom to watch tv with peers at lunch time.
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
## 2015-07-14 NOTE — Progress Notes (Signed)
Inpatient Diabetes Program Recommendations  AACE/ADA: New Consensus Statement on Inpatient Glycemic Control (2015)  Target Ranges:  Prepandial:   less than 140 mg/dL      Peak postprandial:   less than 180 mg/dL (1-2 hours)      Critically ill patients:  140 - 180 mg/dL   Results for Junious DresserSTEWART, Nickalus E (MRN [id number]) as of 07/14/2015 14:55  Ref. Range 07/13/2015 06:58 07/13/2015 11:34 07/13/2015 16:32 07/13/2015 20:46  Glucose-Capillary Latest Ref Range: 65-99 mg/dL 782165 (H) 956308 (H) 213289 (H) 82   Results for Junious DresserSTEWART, [place] E (MRN [id number]) as of 07/14/2015 14:55  Ref. Range 07/14/2015 06:27 07/14/2015 11:46  Glucose-Capillary Latest Ref Range: 65-99 mg/dL 629168 (H) 528242 (H)    Admit with: Depression With Pyschosis  History: DM  Home DM Meds: Levemir 15 units QHS  Current Insulin Orders: Levemir 15 units QHS      Novolog 0-24 units TID AC      -Note Levemir held last PM.  Not sure why??  -PO intake only 10-40% of meals at present.  -Patient also getting Prednisone 10 mg daily.  -No insulin recommendations at this time since Levemir held last PM.  Would like to see how patient responds to Levemir tonight.      --Will follow patient during hospitalization--  Ambrose FinlandJeannine Johnston Paulo Keimig RN, MSN, CDE Diabetes Coordinator Inpatient Glycemic Control Team Team Pager: [phone] (8a-5p)

## 2015-07-14 NOTE — Progress Notes (Signed)
D: Patient is alert and oriented on the unit this shift. Patient attended and actively participated in groups today. Patient denies suicidal ideation, homicidal ideation, auditory or visual hallucinations at the present time.  A: Scheduled medications are administered to patient as per MD orders. Emotional support and encouragement are provided. Patient is maintained on q.15 minute safety checks. Patient is informed to notify staff with questions or concerns. R: No adverse medication reactions are noted. Patient is cooperative with medication administration and treatment plan today. Patient is not receptive, calm and cooperative on the unit at this time. Patient does not  interact   with others on the unit this shift. Patient contracts for safety at this time. Patient remains safe at this time.

## 2015-07-14 NOTE — Plan of Care (Signed)
Problem: Coping: Goal: Ability to verbalize frustrations and anger appropriately will improve Outcome: Not Progressing Patient represses feelings ,does not verbalize his feelings  Without being asked [place] RN

## 2015-07-14 NOTE — Tx Team (Signed)
Interdisciplinary Treatment Plan Update (Adult)        Date: 07/14/2015   Time Reviewed: 9:30 AM   Progress in Treatment: Improving  Attending groups: Intermittently  Participating in groups: Intermittently Taking medication as prescribed: Yes  Tolerating medication: Yes  Family/Significant other contact made: No, CSW assessing for appropriate contacts  Patient understands diagnosis: Yes  Discussing patient identified problems/goals with staff: Yes  Medical problems stabilized or resolved: Yes  Denies suicidal/homicidal ideation: Yes  Issues/concerns per patient self-inventory: Yes  Other:   New problem(s) identified: N/A   Discharge Plan or Barriers: Pt will discharge to his group home, [facility] and will follow up with [facility] for for medication management and therapy  Reason for Continuation of Hospitalization:   Depression   Anxiety   Medication Stabilization   Comments: N/A   Estimated length of stay: 3-5 days     Patient is a 60 year old man with a history of recurrent severe depression with psychotic features who comes into the hospital initially referred from his group home because of weakness. Patient was seen in the emergency room and evaluated by emergency room physicians who could find no acute cause for it. On examination the patient demonstrates waxy flexibility. Flat affect. Very withdrawn speech very limited thinking. Patient is vague about whether he is depressed. Vague about suicidal ideation. Denies any hallucinations. Says he's been compliant with his medicine. On the other hand he says that his weakness has been getting worse and worse ever since he got out of [place] more hopeless and withdrawn. He claims he is compliant with medicine. No evidence of substance abusePatient [place], [place]. Patient will benefit from crisis stabilization, medication evaluation, group therapy, and psycho education in addition to case management  for discharge planning. Patient and CSW reviewed pt's identified goals and treatment plan. Pt verbalized understanding and agreed to treatment plan.    Review of initial/current patient goals per problem list:  1. Goal(s): Patient will participate in aftercare plan   Met: Yes  Target date: 3-5 days post admission date   As evidenced by: Patient will participate within aftercare plan AEB aftercare provider and housing plan at discharge being identified.   5/26: Pt will discharge to his group home, [place] and will follow up with [facility] for for medication management and therapy    2. Goal (s): Patient will exhibit decreased depressive symptoms and suicidal ideations.   Met: No  Target date: 3-5 days post admission date   As evidenced by: Patient will utilize self-rating of depression at 3 or below and demonstrate decreased signs of depression or be deemed stable for discharge by MD.  5/26: Goal progressing.  5/30: Goal progressing.     3. Goal(s): Patient will demonstrate decreased signs and symptoms of anxiety.   Met: No  Target date: 3-5 days post admission date   As evidenced by: Patient will utilize self-rating of anxiety at 3 or below and demonstrated decreased signs of anxiety, or be deemed stable for discharge by MD   5/26: Goal progressing.  5/30: Goal progressing.     4. Goal(s): Patient will demonstrate decreased signs of psychosis  * Met: No * Target date: 3-5 days post admission date  * As evidenced by: Patient will demonstrate decreased frequency of AVH or return to baseline function   5/26: Pt denies AVH  5/30: Goal progressing.      Attendees:  Patient:  Family:  Physician: Dr. Jerilee Hoh, MD 07/14/2015  9:30 AM  Nursing: Floyde Parkins, RN 07/14/2015 9:30 AM  Clinical Social Worker: Marylou Flesher, August 07/14/2015 9:30 AM  Nursing: Elige Radon, RN  07/14/2015 9:30 AM  Nursing: Carolynn Sayers, RN 07/14/2015 9:30 AM  Clinical Social Worker: [place], Girard 07/14/2015 9:30 AM  Clinical Social Worker: Dossie Arbour, LCSW 07/14/2015 9:30 AM   Marylou Flesher [place], LCAS  07/14/15

## 2015-07-15 ENCOUNTER — Other Ambulatory Visit: Payer: Self-pay | Admitting: Internal Medicine

## 2015-07-15 ENCOUNTER — Encounter: Payer: Self-pay | Admitting: *Deleted

## 2015-07-15 ENCOUNTER — Inpatient Hospital Stay: Payer: Medicaid Other | Admitting: Anesthesiology

## 2015-07-15 ENCOUNTER — Observation Stay
Admission: AD | Admit: 2015-07-15 | Discharge: 2015-07-15 | Disposition: A | Discharge status: Home / Self Care | Payer: Medicaid Other | Source: Ambulatory Visit | Attending: Internal Medicine | Admitting: Internal Medicine

## 2015-07-15 ENCOUNTER — Other Ambulatory Visit: Payer: Self-pay | Admitting: Psychiatry

## 2015-07-15 ENCOUNTER — Inpatient Hospital Stay
Admission: AD | Admit: 2015-07-15 | Discharge: 2015-07-17 | DRG: 281 | Disposition: A | Discharge status: Other Acute Inpatient Hospital | Payer: Medicaid Other | Source: Ambulatory Visit | Attending: Internal Medicine | Admitting: Internal Medicine

## 2015-07-15 ENCOUNTER — Inpatient Hospital Stay: Payer: Medicaid Other

## 2015-07-15 DIAGNOSIS — M549 Dorsalgia, unspecified: Secondary | ICD-10-CM | POA: Diagnosis present

## 2015-07-15 DIAGNOSIS — M869 Osteomyelitis, unspecified: Secondary | ICD-10-CM | POA: Diagnosis present

## 2015-07-15 DIAGNOSIS — G4733 Obstructive sleep apnea (adult) (pediatric): Secondary | ICD-10-CM | POA: Diagnosis present

## 2015-07-15 DIAGNOSIS — I214 Non-ST elevation (NSTEMI) myocardial infarction: Principal | ICD-10-CM | POA: Diagnosis present

## 2015-07-15 DIAGNOSIS — Z89512 Acquired absence of left leg below knee: Secondary | ICD-10-CM

## 2015-07-15 DIAGNOSIS — Z886 Allergy status to analgesic agent status: Secondary | ICD-10-CM

## 2015-07-15 DIAGNOSIS — Z803 Family history of malignant neoplasm of breast: Secondary | ICD-10-CM

## 2015-07-15 DIAGNOSIS — F332 Major depressive disorder, recurrent severe without psychotic features: Secondary | ICD-10-CM | POA: Diagnosis present

## 2015-07-15 DIAGNOSIS — E222 Syndrome of inappropriate secretion of antidiuretic hormone: Secondary | ICD-10-CM | POA: Diagnosis present

## 2015-07-15 DIAGNOSIS — Z801 Family history of malignant neoplasm of trachea, bronchus and lung: Secondary | ICD-10-CM

## 2015-07-15 DIAGNOSIS — F4310 Post-traumatic stress disorder, unspecified: Secondary | ICD-10-CM | POA: Diagnosis present

## 2015-07-15 DIAGNOSIS — J309 Allergic rhinitis, unspecified: Secondary | ICD-10-CM | POA: Diagnosis present

## 2015-07-15 DIAGNOSIS — J42 Unspecified chronic bronchitis: Secondary | ICD-10-CM | POA: Diagnosis present

## 2015-07-15 DIAGNOSIS — M109 Gout, unspecified: Secondary | ICD-10-CM | POA: Diagnosis present

## 2015-07-15 DIAGNOSIS — I10 Essential (primary) hypertension: Secondary | ICD-10-CM | POA: Diagnosis present

## 2015-07-15 DIAGNOSIS — E271 Primary adrenocortical insufficiency: Secondary | ICD-10-CM | POA: Diagnosis present

## 2015-07-15 DIAGNOSIS — E1140 Type 2 diabetes mellitus with diabetic neuropathy, unspecified: Secondary | ICD-10-CM | POA: Diagnosis present

## 2015-07-15 DIAGNOSIS — Z87891 Personal history of nicotine dependence: Secondary | ICD-10-CM

## 2015-07-15 DIAGNOSIS — Z91199 Patient's noncompliance with other medical treatment and regimen due to unspecified reason: Secondary | ICD-10-CM

## 2015-07-15 DIAGNOSIS — G8929 Other chronic pain: Secondary | ICD-10-CM | POA: Diagnosis present

## 2015-07-15 DIAGNOSIS — Z794 Long term (current) use of insulin: Secondary | ICD-10-CM

## 2015-07-15 DIAGNOSIS — H409 Unspecified glaucoma: Secondary | ICD-10-CM | POA: Diagnosis present

## 2015-07-15 DIAGNOSIS — Z833 Family history of diabetes mellitus: Secondary | ICD-10-CM

## 2015-07-15 DIAGNOSIS — Z89511 Acquired absence of right leg below knee: Secondary | ICD-10-CM

## 2015-07-15 DIAGNOSIS — F209 Schizophrenia, unspecified: Secondary | ICD-10-CM | POA: Diagnosis present

## 2015-07-15 DIAGNOSIS — E1142 Type 2 diabetes mellitus with diabetic polyneuropathy: Secondary | ICD-10-CM | POA: Diagnosis present

## 2015-07-15 DIAGNOSIS — Z9119 Patient's noncompliance with other medical treatment and regimen: Secondary | ICD-10-CM

## 2015-07-15 DIAGNOSIS — Z79899 Other long term (current) drug therapy: Secondary | ICD-10-CM

## 2015-07-15 DIAGNOSIS — Z87820 Personal history of traumatic brain injury: Secondary | ICD-10-CM

## 2015-07-15 DIAGNOSIS — K219 Gastro-esophageal reflux disease without esophagitis: Secondary | ICD-10-CM | POA: Diagnosis present

## 2015-07-15 DIAGNOSIS — R9431 Abnormal electrocardiogram [ECG] [EKG]: Secondary | ICD-10-CM | POA: Diagnosis present

## 2015-07-15 LAB — BASIC METABOLIC PANEL
ANION GAP: 10 (ref 5–15)
BUN: 13 mg/dL (ref 6–20)
CHLORIDE: 91 mmol/L — AB (ref 101–111)
CO2: 25 mmol/L (ref 22–32)
Calcium: 8.4 mg/dL — ABNORMAL LOW (ref 8.9–10.3)
Creatinine, Ser: 0.87 mg/dL (ref 0.61–1.24)
GFR calc Af Amer: >60 mL/min (ref >60)
GFR calc non Af Amer: >60 mL/min (ref >60)
GLUCOSE: 201 mg/dL — AB (ref 65–99)
POTASSIUM: 3.5 mmol/L (ref 3.5–5.1)
Sodium: 126 mmol/L — ABNORMAL LOW (ref 135–145)

## 2015-07-15 LAB — CBC
HEMATOCRIT: 36.9 % — AB (ref 40.0–52.0)
HEMATOCRIT: 36.6 % — AB (ref 40.0–52.0)
HEMOGLOBIN: 12.4 g/dL — AB (ref 13.0–18.0)
HEMOGLOBIN: 12.3 g/dL — AB (ref 13.0–18.0)
MCH: 27.1 pg (ref 26.0–34.0)
MCH: 26.8 pg (ref 26.0–34.0)
MCHC: 33.5 g/dL (ref 32.0–36.0)
MCHC: 33.7 g/dL (ref 32.0–36.0)
MCV: 80.8 fL (ref 80.0–100.0)
MCV: 79.6 fL — ABNORMAL LOW (ref 80.0–100.0)
Platelets: 217 10*3/uL (ref 150–440)
Platelets: 223 10*3/uL (ref 150–440)
RBC: 4.56 MIL/uL (ref 4.40–5.90)
RBC: 4.6 MIL/uL (ref 4.40–5.90)
RDW: 16.9 % — ABNORMAL HIGH (ref 11.5–14.5)
RDW: 16.9 % — AB (ref 11.5–14.5)
WBC: 7.8 10*3/uL (ref 3.8–10.6)
WBC: 15.5 10*3/uL — AB (ref 3.8–10.6)

## 2015-07-15 LAB — CREATININE, SERUM: Creatinine, Ser: 0.87 mg/dL (ref 0.61–1.24)

## 2015-07-15 LAB — TROPONIN I
Troponin I: 0.07 ng/mL — ABNORMAL HIGH (ref <0.031)
Troponin I: 0.6 ng/mL — ABNORMAL HIGH (ref <0.031)

## 2015-07-15 LAB — GLUCOSE, CAPILLARY
Glucose-Capillary: 286 mg/dL — ABNORMAL HIGH (ref 65–99)
Glucose-Capillary: 171 mg/dL — ABNORMAL HIGH (ref 65–99)

## 2015-07-15 LAB — ECHOCARDIOGRAM COMPLETE
HEIGHTINCHES: 70 in
WEIGHTICAEL: 3040 [oz_av]

## 2015-07-15 LAB — MRSA PCR SCREENING: MRSA by PCR: NEGATIVE

## 2015-07-15 LAB — MAGNESIUM: Magnesium: 1.5 mg/dL — ABNORMAL LOW (ref 1.7–2.4)

## 2015-07-15 MED ORDER — SUCCINYLCHOLINE CHLORIDE 200 MG/10ML IV SOSY
PREFILLED_SYRINGE | INTRAVENOUS | Status: DC | PRN
  Administered 2015-07-15: 90 mg via INTRAVENOUS

## 2015-07-15 MED ORDER — ONDANSETRON HCL 4 MG/2ML IJ SOLN: 4.0000 mg | Freq: Four times a day (QID) | INTRAMUSCULAR | Status: DC | PRN

## 2015-07-15 MED ORDER — SODIUM CHLORIDE 0.9 % IV SOLN
250.0000 mL | Freq: Once | INTRAVENOUS | Status: AC
  Administered 2015-07-15: 10:00:00 via INTRAVENOUS

## 2015-07-15 MED ORDER — LURASIDONE HCL 40 MG PO TABS
160.0000 mg | ORAL_TABLET | Freq: Every day | ORAL | Status: DC
  Administered 2015-07-15 – 2015-07-16 (×2): 160 mg via ORAL
  Filled 2015-07-15 (×2): qty 4

## 2015-07-15 MED ORDER — NICOTINE 14 MG/24HR TD PT24
14.0000 mg | MEDICATED_PATCH | Freq: Every day | TRANSDERMAL | Status: DC
  Administered 2015-07-16 – 2015-07-17 (×2): 14 mg via TRANSDERMAL
  Filled 2015-07-15 (×2): qty 1

## 2015-07-15 MED ORDER — ACETAMINOPHEN 650 MG RE SUPP: 650.0000 mg | Freq: Four times a day (QID) | RECTAL | Status: DC | PRN

## 2015-07-15 MED ORDER — ALUM & MAG HYDROXIDE-SIMETH 200-200-20 MG/5ML PO SUSP: 30.0000 mL | Freq: Four times a day (QID) | ORAL | Status: DC | PRN

## 2015-07-15 MED ORDER — ADULT MULTIVITAMIN W/MINERALS CH
1.0000 | ORAL_TABLET | Freq: Every day | ORAL | Status: DC
  Administered 2015-07-15 – 2015-07-17 (×3): 1 via ORAL
  Filled 2015-07-15 (×3): qty 1

## 2015-07-15 MED ORDER — CLONAZEPAM 0.5 MG PO TABS
0.5000 mg | ORAL_TABLET | Freq: Three times a day (TID) | ORAL | Status: DC
  Administered 2015-07-15 – 2015-07-17 (×6): 0.5 mg via ORAL
  Filled 2015-07-15 (×6): qty 1

## 2015-07-15 MED ORDER — PANTOPRAZOLE SODIUM 40 MG PO TBEC
40.0000 mg | DELAYED_RELEASE_TABLET | Freq: Every day | ORAL | Status: DC
  Administered 2015-07-16 – 2015-07-17 (×2): 40 mg via ORAL
  Filled 2015-07-15 (×3): qty 1

## 2015-07-15 MED ORDER — ONDANSETRON HCL 4 MG PO TABS: 4.0000 mg | ORAL_TABLET | Freq: Four times a day (QID) | ORAL | Status: DC | PRN

## 2015-07-15 MED ORDER — SODIUM CHLORIDE 0.9% FLUSH: 3.0000 mL | Freq: Two times a day (BID) | INTRAVENOUS | Status: DC

## 2015-07-15 MED ORDER — GUAIFENESIN 100 MG/5ML PO SOLN: 200.0000 mg | Freq: Three times a day (TID) | ORAL | Status: DC | PRN

## 2015-07-15 MED ORDER — BISMUTH SUBSALICYLATE 262 MG/15ML PO SUSP
30.0000 mL | ORAL | Status: DC | PRN
  Filled 2015-07-15: qty 118

## 2015-07-15 MED ORDER — ACETAMINOPHEN 325 MG PO TABS: 650.0000 mg | ORAL_TABLET | Freq: Four times a day (QID) | ORAL | Status: DC | PRN

## 2015-07-15 MED ORDER — CLOPIDOGREL BISULFATE 75 MG PO TABS
300.0000 mg | ORAL_TABLET | Freq: Once | ORAL | Status: AC
  Administered 2015-07-15: 300 mg via ORAL
  Filled 2015-07-15: qty 4

## 2015-07-15 MED ORDER — MAGNESIUM HYDROXIDE 400 MG/5ML PO SUSP: 30.0000 mL | Freq: Every day | ORAL | Status: DC | PRN

## 2015-07-15 MED ORDER — HYDRALAZINE HCL 20 MG/ML IJ SOLN
INTRAMUSCULAR | Status: DC | PRN
  Administered 2015-07-15: 20 mg via INTRAVENOUS

## 2015-07-15 MED ORDER — LOPERAMIDE HCL 2 MG PO CAPS: 2.0000 mg | ORAL_CAPSULE | ORAL | Status: DC | PRN

## 2015-07-15 MED ORDER — MELATONIN 3 MG PO TABS: 3.0000 | ORAL_TABLET | Freq: Every day | ORAL | Status: DC

## 2015-07-15 MED ORDER — NITROGLYCERIN 2 % TD OINT
TOPICAL_OINTMENT | TRANSDERMAL | Status: DC | PRN
  Administered 2015-07-15: 1 [in_us] via TOPICAL

## 2015-07-15 MED ORDER — ESMOLOL HCL 100 MG/10ML IV SOLN
INTRAVENOUS | Status: DC | PRN
  Administered 2015-07-15: 30 mg via INTRAVENOUS

## 2015-07-15 MED ORDER — NITROGLYCERIN 0.4 MG SL SUBL: 0.4000 mg | SUBLINGUAL_TABLET | SUBLINGUAL | Status: DC | PRN

## 2015-07-15 MED ORDER — LABETALOL HCL 5 MG/ML IV SOLN
INTRAVENOUS | Status: DC | PRN
  Administered 2015-07-15: 10 mg via INTRAVENOUS

## 2015-07-15 MED ORDER — HYDRALAZINE HCL 25 MG PO TABS
25.0000 mg | ORAL_TABLET | Freq: Three times a day (TID) | ORAL | Status: DC
  Administered 2015-07-15 – 2015-07-17 (×6): 25 mg via ORAL
  Filled 2015-07-15 (×6): qty 1

## 2015-07-15 MED ORDER — LACTATED RINGERS IV SOLN
INTRAVENOUS | Status: DC | PRN
  Administered 2015-07-15: 12:00:00 via INTRAVENOUS

## 2015-07-15 MED ORDER — BACITRACIN-NEOMYCIN-POLYMYXIN 400-5-5000 EX OINT
1.0000 "application " | TOPICAL_OINTMENT | CUTANEOUS | Status: DC | PRN
  Filled 2015-07-15: qty 1

## 2015-07-15 MED ORDER — SENNOSIDES-DOCUSATE SODIUM 8.6-50 MG PO TABS: 2.0000 | ORAL_TABLET | Freq: Every day | ORAL | Status: DC | PRN

## 2015-07-15 MED ORDER — ONDANSETRON HCL 4 MG PO TABS
8.0000 mg | ORAL_TABLET | Freq: Four times a day (QID) | ORAL | Status: DC | PRN
  Administered 2015-07-15: 8 mg via ORAL
  Filled 2015-07-15: qty 2

## 2015-07-15 MED ORDER — ENOXAPARIN SODIUM 40 MG/0.4ML ~~LOC~~ SOLN
40.0000 mg | SUBCUTANEOUS | Status: DC
  Administered 2015-07-15 – 2015-07-16 (×2): 40 mg via SUBCUTANEOUS
  Filled 2015-07-15 (×2): qty 0.4

## 2015-07-15 MED ORDER — CLOPIDOGREL BISULFATE 75 MG PO TABS
75.0000 mg | ORAL_TABLET | Freq: Every day | ORAL | Status: DC
  Administered 2015-07-16 – 2015-07-17 (×2): 75 mg via ORAL
  Filled 2015-07-15 (×2): qty 1

## 2015-07-15 MED ORDER — NITROGLYCERIN 0.2 MG/ML ON CALL CATH LAB
INTRAVENOUS | Status: DC | PRN
  Administered 2015-07-15 (×3): 100 ug via INTRAVENOUS

## 2015-07-15 MED ORDER — DOCUSATE SODIUM 100 MG PO CAPS
100.0000 mg | ORAL_CAPSULE | Freq: Two times a day (BID) | ORAL | Status: DC
  Administered 2015-07-15 – 2015-07-17 (×3): 100 mg via ORAL
  Filled 2015-07-15 (×4): qty 1

## 2015-07-15 MED ORDER — METHOHEXITAL SODIUM 100 MG/10ML IV SOSY
PREFILLED_SYRINGE | INTRAVENOUS | Status: DC | PRN
  Administered 2015-07-15: 90 mg via INTRAVENOUS

## 2015-07-15 MED ORDER — MAGNESIUM OXIDE 400 (241.3 MG) MG PO TABS
400.0000 mg | ORAL_TABLET | Freq: Two times a day (BID) | ORAL | Status: DC
  Administered 2015-07-15 – 2015-07-17 (×5): 400 mg via ORAL
  Filled 2015-07-15 (×5): qty 1

## 2015-07-15 MED ORDER — PREDNISONE 10 MG PO TABS
10.0000 mg | ORAL_TABLET | Freq: Every day | ORAL | Status: DC
  Administered 2015-07-16 – 2015-07-17 (×2): 10 mg via ORAL
  Filled 2015-07-15 (×2): qty 1

## 2015-07-15 MED ORDER — INSULIN ASPART 100 UNIT/ML ~~LOC~~ SOLN
0.0000 [IU] | Freq: Three times a day (TID) | SUBCUTANEOUS | Status: DC
  Administered 2015-07-15: 5 [IU] via SUBCUTANEOUS
  Administered 2015-07-16: 1 [IU] via SUBCUTANEOUS
  Administered 2015-07-16: 2 [IU] via SUBCUTANEOUS
  Administered 2015-07-17: 1 [IU] via SUBCUTANEOUS
  Filled 2015-07-15: qty 1
  Filled 2015-07-15: qty 5
  Filled 2015-07-15: qty 2
  Filled 2015-07-15: qty 1

## 2015-07-15 MED ORDER — SODIUM CHLORIDE 0.9 % IV SOLN
INTRAVENOUS | Status: DC
  Administered 2015-07-15 – 2015-07-17 (×4): via INTRAVENOUS

## 2015-07-15 MED ORDER — PREGABALIN 75 MG PO CAPS
75.0000 mg | ORAL_CAPSULE | Freq: Three times a day (TID) | ORAL | Status: DC
  Administered 2015-07-15 – 2015-07-17 (×6): 75 mg via ORAL
  Filled 2015-07-15 (×6): qty 1

## 2015-07-15 MED ORDER — FLUOXETINE HCL 20 MG PO CAPS
60.0000 mg | ORAL_CAPSULE | Freq: Every day | ORAL | Status: DC
  Administered 2015-07-15 – 2015-07-17 (×3): 60 mg via ORAL
  Filled 2015-07-15 (×3): qty 3

## 2015-07-15 MED ORDER — INSULIN ASPART 100 UNIT/ML ~~LOC~~ SOLN: 0.0000 [IU] | Freq: Every day | SUBCUTANEOUS | Status: DC

## 2015-07-15 MED ORDER — INSULIN DETEMIR 100 UNIT/ML ~~LOC~~ SOLN
15.0000 [IU] | Freq: Every day | SUBCUTANEOUS | Status: DC
  Administered 2015-07-15 – 2015-07-16 (×2): 15 [IU] via SUBCUTANEOUS
  Filled 2015-07-15 (×3): qty 0.15

## 2015-07-15 MED ORDER — FLUTICASONE PROPIONATE 50 MCG/ACT NA SUSP
1.0000 | Freq: Every day | NASAL | Status: DC
  Administered 2015-07-16 – 2015-07-17 (×2): 1 via NASAL
  Filled 2015-07-15: qty 16

## 2015-07-15 MED ORDER — LISINOPRIL 10 MG PO TABS
10.0000 mg | ORAL_TABLET | Freq: Every day | ORAL | Status: DC
  Administered 2015-07-15 – 2015-07-17 (×3): 10 mg via ORAL
  Filled 2015-07-15 (×3): qty 1

## 2015-07-15 MED ORDER — CYANOCOBALAMIN 1000 MCG/ML IJ SOLN: 1000.0000 ug | INTRAMUSCULAR | Status: DC

## 2015-07-15 MED ORDER — SODIUM CHLORIDE 0.9 % IV SOLN
INTRAVENOUS | Status: DC | PRN
  Administered 2015-07-15: 11:00:00 via INTRAVENOUS

## 2015-07-15 MED ORDER — FLUDROCORTISONE ACETATE 0.1 MG PO TABS
0.2000 mg | ORAL_TABLET | Freq: Every day | ORAL | Status: DC
  Administered 2015-07-15 – 2015-07-17 (×3): 0.2 mg via ORAL
  Filled 2015-07-15 (×5): qty 2

## 2015-07-15 NOTE — Progress Notes (Addendum)
  [facility] Discharge Plan :  Will you be returning to the same living situation after discharge:  No. Pt [facility] At discharge, do you have transportation home?: No. Pt will be transported to medical floor Do you have the ability to pay for your medications: Yes,  pt will be provided with prescriptions at discharge  Release of information consent forms completed and in the chart;  Patient's signature needed at discharge.  [facility]:  [facility] [address] [place] [address] Ph: [phone] Fax: [phone]    Next level [facility] care provider [facility] Link:no  Safety Planning and Suicide Prevention discussed: No. Pt refused SPE from the CSW     Has patient been referred to the Quitline?: N/A patient is not a smoker  Patient has been referred for addiction treatment: N/A  Mercy RidingJonathan F Madhuri Vacca 07/15/2015, 10:24 PM

## 2015-07-15 NOTE — Anesthesia Procedure Notes (Signed)
Date/Time: 07/15/2015 11:22 AM Performed by: Lily KocherPERALTA, Neeka Urista Pre-anesthesia Checklist: Patient identified, Emergency Drugs available, Suction available and Patient being monitored Patient Re-evaluated:Patient Re-evaluated prior to inductionOxygen Delivery Method: Circle system utilized Preoxygenation: Pre-oxygenation with 100% oxygen Intubation Type: IV induction Ventilation: Mask ventilation without difficulty and Mask ventilation throughout procedure Airway Equipment and Method: Bite block Placement Confirmation: positive ETCO2 Dental Injury: Teeth and Oropharynx as per pre-operative assessment

## 2015-07-15 NOTE — Plan of Care (Signed)
Problem: Safety: Goal: Periods of time without injury will increase Outcome: Not Met (add Reason) Pt remains depressed. Flat sad affect. Thought blocking. Med compliant. No PRNs given. q 15 min checks maintained for safety. Pt remains free from harm.

## 2015-07-15 NOTE — Progress Notes (Signed)
Recreation Therapy Notes  Date: 05.31.17 Time: 9:30 am Location: Craft Room  Group Topic: Self-esteem  Goal Area(s) Addresses:  Patient will identify positive traits about self. Patient will identify healthy coping skills.  Behavioral Response: Did not attend  Intervention: All About Me  Activity: Patients were instructed to make a pamphlet including positive traits, healthy coping skills, and their support system.  Education: LRT educated patients on ways to increase their self-esteem.  Education Outcome: Patient did not attend group.   Clinical Observations/Feedback: Patient did not attend group.  Jacquelynn CreeGreene,Athene Schuhmacher M, LRT/CTRS 07/15/2015 10:44 AM

## 2015-07-15 NOTE — Tx Team (Signed)
Interdisciplinary Treatment Plan Update (Adult)        Date: 07/15/2015   Time Reviewed: 9:30 AM   Progress in Treatment: Improving  Attending groups: Intermittently  Participating in groups: Intermittently Taking medication as prescribed: Yes  Tolerating medication: Yes  Family/Significant other contact made: No, CSW assessing for appropriate contacts  Patient understands diagnosis: Yes  Discussing patient identified problems/goals with staff: Yes  Medical problems stabilized or resolved: Yes  Denies suicidal/homicidal ideation: Yes  Issues/concerns per patient self-inventory: Yes  Other:   New problem(s) identified: N/A   Discharge Plan or Barriers: Pt will discharge to his group home, [facility] and will follow up with [facility] for for medication management and therapy after discharge from the medical floor.  Pt is currently being referred to [place] team with Thayer Headings at [phone]  Reason for Continuation of Hospitalization:   Depression   Anxiety   Medication Stabilization   Comments: N/A   Estimated length of stay: 3-5 days     Patient is a 60 year old man with a history of recurrent severe depression with psychotic features who comes into the hospital initially referred from his group home because of weakness. Patient was seen in the emergency room and evaluated by emergency room physicians who could find no acute cause for it. On examination the patient demonstrates waxy flexibility. Flat affect. Very withdrawn speech very limited thinking. Patient is vague about whether he is depressed. Vague about suicidal ideation. Denies any hallucinations. Says he's been compliant with his medicine. On the other hand he says that his weakness has been getting worse and worse ever since he got out of [facility] more hopeless and withdrawn. He claims he is compliant with medicine. No evidence of substance abusePatient [place], [place]. Patient will  benefit from crisis stabilization, medication evaluation, group therapy, and psycho education in addition to case management for discharge planning. Patient and CSW reviewed pt's identified goals and treatment plan. Pt verbalized understanding and agreed to treatment plan.    Review of initial/current patient goals per problem list:  1. Goal(s): Patient will participate in aftercare plan   Met: Yes  Target date: 3-5 days post admission date   As evidenced by: Patient will participate within aftercare plan AEB aftercare provider and housing plan at discharge being identified.   5/26: Pt will discharge to his group home, [facility] and will follow up with [facility] for for medication management and therapy after discharge from the medical floor.  Pt is currently being referred to [place] team with Thayer Headings at [phone]    2. Goal (s): Patient will exhibit decreased depressive symptoms and suicidal ideations.   Met: Adequate for discharge per MD.  Target date: 3-5 days post admission date   As evidenced by: Patient will utilize self-rating of depression at 3 or below and demonstrate decreased signs of depression or be deemed stable for discharge by MD.  5/26: Goal progressing.  5/30: Goal progressing.  5/31: Adequate for discharge per MD.      3. Goal(s): Patient will demonstrate decreased signs and symptoms of anxiety.   Met: Adequate for discharge per MD.  Target date: 3-5 days post admission date   As evidenced by: Patient will utilize self-rating of anxiety at 3 or below and demonstrated decreased signs of anxiety, or be deemed stable for discharge by MD   5/26: Goal progressing.  5/30: Goal progressing.  5/31: Adequate for discharge per MD.  4. Goal(s): Patient will demonstrate decreased signs of psychosis  * Met: Adequate for discharge per MD. * Target date: 3-5 days post admission date  * As evidenced by: Patient will demonstrate decreased frequency of  AVH or return to baseline function   5/26: Pt denies AVH  5/30: Goal progressing.  5/31: Adequate for discharge per MD.   Treatment team meeting:  Psychiatrist: Dr. Jerilee Hoh MD    07/15/15 at 9:30am Clincial Social Worker: Marylou Flesher, [place]     07/15/15 at 9:30am   Nursing: Floyde Parkins, RN       07/15/15 at 9:30am   Nursing: Elige Radon, RN    07/15/15 at 9:30am   Nursing: Geanie Berlin, RN      07/15/15 at 9:30am    Alphonse Guild. Dereon Williamsen, LCSWA, LCAS  07/15/15
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
## 2015-07-15 NOTE — Anesthesia Postprocedure Evaluation (Signed)
Anesthesia Post Note  Patient: [name]  Procedure(s) Performed: * No procedures listed *  Patient location during evaluation: PACU Anesthesia Type: General Level of consciousness: awake and alert Pain management: pain level controlled Vital Signs Assessment: post-procedure vital signs reviewed and stable Respiratory status: spontaneous breathing Cardiovascular status: blood pressure returned to baseline Comments: Patient had episode of sinus tach, hypertension and diaphoresis status post shock with possibble transient ST depression, but it was noted that the EKG pads were not sticking properly secondary to the diaphoresis and when the patient was dried and pads were replaced and after the Nitroglycerin was given for the pressure, the EKG returned to normal... BP was treated with hydralazine, labetolol, esmolol and nitroglycerin. [name] from cardiology saw the patient and cleared from cardiac standpoint.  Hospitalists will observe overnight    Last Vitals:  Filed Vitals:   07/15/15 1335 07/15/15 1354  BP: 135/63 169/81  Pulse: 71 77  Temp:  36.9 C  Resp: 16 18    Last Pain:  Filed Vitals:   07/15/15 1355  PainSc: 0-No pain                 Lauralynn Loeb

## 2015-07-15 NOTE — Procedures (Addendum)
ECT SERVICES Physician's Interval Evaluation & Treatment Note  Patient Identification: Junious DresserDouglas E Zapata MRN:  [id number] Date of Evaluation:  07/15/2015 TX #: 1  MADRS: 36  MMSE: 30  P.E. Findings:  Bilateral below the knee amputation chronic. Lungs clear heart normal. Poor grooming.  Psychiatric Interval Note:  Flat withdrawn little interaction feeling depressed  Subjective:  Patient is a 60 y.o. male seen for evaluation for Electroconvulsive Therapy. Not feeling well. Depressed.  Treatment Summary:   []   Right Unilateral             [x]  Bilateral   % Energy : 1.0 ms 40%   Impedance: 820 ohms  Seizure Energy Index: 2074 V squared  Postictal Suppression Index: 50%  Seizure Concordance Index: 89%  Medications  Pre Shock: Brevital 90 mg, succinylcholine 90 mg  Post Shock: Labetalol 10 mg, hydralazine 20 mg, nitroglycerin IV 300 g, 1 inch of nitro paste  Seizure Duration: 59 seconds by EEG. EMG reading not possible   Comments:  patient had extended ST depression following treatment during recovery. Blood pressure remained high. Cardiology and hospitalist consult are underway Lungs:  [x]   Clear to auscultation               []  Other:   Heart:    [x]   Regular rhythm             []  irregular rhythm    [x]   Previous H&P reviewed, patient examined and there are NO CHANGES                 []   Previous H&P reviewed, patient examined and there are changes noted.   Mordecai RasmussenJohn Clapacs, MD 5/31/201711:12 AM

## 2015-07-15 NOTE — Progress Notes (Signed)
Report called to Ms. Gearldine BienenstockBrandy, RN at 2:20pm.
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
## 2015-07-15 NOTE — Transfer of Care (Addendum)
Immediate Anesthesia Transfer of Care Note  Patient: Gregory DresserDouglas E Calvey  Procedure(s) Performed: ECT  Patient Location: PACU  Anesthesia Type:General  Level of Consciousness: sedated  Airway & Oxygen Therapy: Patient Spontanous Breathing and Patient connected to face mask oxygen  Post-op Assessment: Report given to RN  Post vital signs: Reviewed and stable  Last Vitals:  Filed Vitals:   07/15/15 1215 07/15/15 1220  BP: 118/69   Pulse: 61 58  Temp: 98.28F   Resp: 12 14    Last Pain:  Filed Vitals:   07/15/15 1224  PainSc: 0-No pain      Patients Stated Pain Goal: 1 (07/15/15 0906)  Complications: No apparent anesthesia complications

## 2015-07-15 NOTE — Progress Notes (Signed)
PT Cancellation Note  Patient Details Name: [name] MRN: [id number] DOB: [birth year]   Cancelled Treatment:    Reason Eval/Treat Not Completed: Patient at procedure or test/unavailable. Chart reviewed and RN consulted. Pt out of room for ECT treatment currently. Will attempt PT treatment on later date/time as pt is available.  Sharalyn InkJason D Davidmichael Zarazua PT, DPT   Arabell Neria 07/15/2015, 10:02 AM
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
## 2015-07-15 NOTE — Progress Notes (Signed)
Patient discharge instructions including his f/up(AVS) discussed with patient. His belongings were taken to Unit 2A, Room 235.
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
## 2015-07-15 NOTE — Progress Notes (Signed)
Pt remains assist x1 with ADLs. bloold sugar 155. No s/s of hypo/hyperglycemia noted. No c/o pain/discomfort noted. Slept 4.30.

## 2015-07-15 NOTE — Progress Notes (Signed)
Patient arrived on floor from ECT. Calm and cooperative. No complaints at this time. VSS. Oriented to room and safety precautions. Patient does not express any suicidal ideation at this time. Skin assessment verified with Gwenlyn FoundJessica C, RN. Telemetry verified with Boneta LucksJenny, NT. Trudee KusterBrandi R Mansfield
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
## 2015-07-15 NOTE — Consult Note (Signed)
Chaplain followed up on the spiritual care assessment. [name] is out having a procedure and will not be back in the unit until after lunch. Technician has directed Chaplain to return then, [name] will be back in a better place to have a conversation. Chaplain will perform normal rounds and return later on as requested.

## 2015-07-15 NOTE — Progress Notes (Signed)
[facility]

## 2015-07-15 NOTE — B[facility] Suicide Risk Assessment (Signed)
[facility]   Principal Problem: Severe recurrent major depression with psychotic features [facility](HCC) Discharge Diagnoses:  Patient Active Problem List   Diagnosis Date Noted  . 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 Hypokalemia [E87.6] 05/29/2015  . S/P bilateral below knee amputation (HCC) [O13.086[Z89.512, Z89.511] 05/29/2015  . Severe recurrent major depression with psychotic features (HCC) [F33.3] 05/29/2015  . Noncompliance [Z91.19] 05/29/2015  . Sinus bradycardia [R00.1] 09/07/2014  . Diabetic foot ulcer associated with diabetes mellitus due to underlying condition (HCC) [V78.469[E08.621, L97.509] 06/12/2014  . Status post below knee amputation of left lower extremity (HCC) [G29.528][Z89.512] 04/05/2014  . 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 Diabetic peripheral neuropathy (HCC) [E11.42] 02/14/2014  . Allergic rhinitis [J30.9] 02/14/2014  . GERD (gastroesophageal reflux disease) [K21.9] 02/14/2014  . Constipation [K59.00] 02/14/2014  . Paranoid schizophrenia, chronic condition (HCC) [F20.0] 12/10/2013  . 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 Foot osteomyelitis, left (HCC) [M86.9] 12/05/2013  . Phantom limb pain (HCC) [G54.6] 11/15/2013  . Depression [F32.9] 11/15/2013  . Generalized anxiety disorder [F41.1] 11/15/2013  . Diabetic foot ulcer with osteomyelitis (HCC) [U13.244[E11.621, E11.69, L97.509, M86.9] 09/09/2013  . PTSD (post-traumatic stress disorder) [F43.10] 09/09/2013  . Addison's disease (HCC) [E27.1] 09/22/2011  . Hyponatremia [E87.1] 03/15/2011  . Headache(784.0) [R51] 03/15/2011  . Essential hypertension, benign [I10] 12/08/2010  . Diabetes mellitus with neuropathy (HCC) [E11.40] 12/08/2010  . Osteomyelitis of toe of left foot (HCC) [M86.9]     Total Time spent with patient: 30 minutes  Musculoskeletal: Strength & Muscle Tone: decreased Gait & Station: unable to stand Patient leans:  Backward  Psychiatric Specialty Exam: Review of Systems  Unable to perform ROS   Blood pressure 111/66, pulse 60, temperature 98.2 F (36.8 C), temperature source Oral, resp. rate 13, height 5\' 10"  (1.778 m), weight 86.183 kg (190 lb), SpO2 93 %.Body mass index is 27.26 kg/(m^2).  General Appearance: Disheveled  Eye Contact::  Minimal  Speech:  Slow and Slurred409  Volume:  Decreased  Mood:  Depressed  Affect:  Congruent  Thought Process:  Linear  Orientation:  Full (Time, Place, and Person)  Thought Content:  Logical  Suicidal Thoughts:  No  Homicidal Thoughts:  No  Memory:  Immediate;   Fair Recent;   Fair Remote;   Fair  Judgement:  Impaired  Insight:  Fair  Psychomotor Activity:  Decreased  Concentration:  Fair  Recall:  Fis[employer]Fair  Fund of Knowledge:Fair  Language: Fair  Akathisia:  No  Handed:  Right  AIMS (if indicated):     Assets:  Desire for Improvement Financial Resources/Insurance Housing Resilience  Sleep:  Number of Hours: 4.3  Cognition: Impaired,  Mild  ADL's:  Impaired   Mental Status Per Nursing Assessment::   On Admission:     Demographic Factors:  Male, Divorced or widowed, Caucasian, Low socioeconomic status and Living alone  Loss Factors: Decline in physical health  Historical Factors: Prior suicide attempts  Risk Reduction Factors:   Religious beliefs about death  Continued Clinical Symptoms:  Depression:   Severe  Cognitive Features That Contribute To Risk:  Loss of executive function    Suicide Risk:  Moderate:  Frequent suicidal ideation with limited intensity, and duration, some specificity in terms of plans, no associated intent, good self-control, limited dysphoria/symptomatology, some risk factors present, and identifiable protective factors, including available and accessible social support.  Follow-up Information    [facility]   Why:  Please arrive between the hours of 9am-5pm on the day of discharge to be  admitted for long-tern residential treatment   Contact information:   [address]  [place], [name] [address] [phone]  Fax: [phone]       Go to [facility].   Why:  Please arrive for your primary care appointment    Contact information:   [address] [place], [name] [address] Ph: [phone]  Fax:[phone]      Go to [facility].   Why:  Please arrive to the walk-in clinic between the hours of 8am-4pm Monday thru Friday for medication managment and therapy   Contact information:   [address] [name] [address] Ph: [phone] Fax: [phone]      Plan Of Care/Follow-up recommendations:  Activity:  Patient's current activity level will be limited to bed for the most part wall on the medical unit Diet:  Low-carb, diabetic Tests:  Troponins and monitoring of cardiac status per medical Other:  Cardiac consult done. Appreciate assistance from hospitalist and cardiology  Mordecai Rasmussen, MD 07/15/2015, 12:34 PM

## 2015-07-15 NOTE — Progress Notes (Signed)
Patient has normal EF and wall motion, and is not candidate for cardia cath and revascularization due to both above knee amputation and peripheral vascular disease.May go back to behavioral medicine in Am . Continue plavix and advise f/u in office.

## 2015-07-16 DIAGNOSIS — H409 Unspecified glaucoma: Secondary | ICD-10-CM | POA: Diagnosis present

## 2015-07-16 DIAGNOSIS — M549 Dorsalgia, unspecified: Secondary | ICD-10-CM | POA: Diagnosis present

## 2015-07-16 DIAGNOSIS — J309 Allergic rhinitis, unspecified: Secondary | ICD-10-CM | POA: Diagnosis present

## 2015-07-16 DIAGNOSIS — G8929 Other chronic pain: Secondary | ICD-10-CM | POA: Diagnosis present

## 2015-07-16 DIAGNOSIS — Z87820 Personal history of traumatic brain injury: Secondary | ICD-10-CM | POA: Diagnosis not present

## 2015-07-16 DIAGNOSIS — E222 Syndrome of inappropriate secretion of antidiuretic hormone: Secondary | ICD-10-CM | POA: Diagnosis present

## 2015-07-16 DIAGNOSIS — Z833 Family history of diabetes mellitus: Secondary | ICD-10-CM | POA: Diagnosis not present

## 2015-07-16 DIAGNOSIS — Z9119 Patient's noncompliance with other medical treatment and regimen: Secondary | ICD-10-CM | POA: Diagnosis not present

## 2015-07-16 DIAGNOSIS — I214 Non-ST elevation (NSTEMI) myocardial infarction: Secondary | ICD-10-CM | POA: Diagnosis present

## 2015-07-16 DIAGNOSIS — I10 Essential (primary) hypertension: Secondary | ICD-10-CM | POA: Diagnosis present

## 2015-07-16 DIAGNOSIS — J42 Unspecified chronic bronchitis: Secondary | ICD-10-CM | POA: Diagnosis present

## 2015-07-16 DIAGNOSIS — F4310 Post-traumatic stress disorder, unspecified: Secondary | ICD-10-CM | POA: Diagnosis present

## 2015-07-16 DIAGNOSIS — F209 Schizophrenia, unspecified: Secondary | ICD-10-CM | POA: Diagnosis present

## 2015-07-16 DIAGNOSIS — Z801 Family history of malignant neoplasm of trachea, bronchus and lung: Secondary | ICD-10-CM | POA: Diagnosis not present

## 2015-07-16 DIAGNOSIS — F332 Major depressive disorder, recurrent severe without psychotic features: Secondary | ICD-10-CM | POA: Insufficient documentation

## 2015-07-16 DIAGNOSIS — Z89512 Acquired absence of left leg below knee: Secondary | ICD-10-CM | POA: Diagnosis not present

## 2015-07-16 DIAGNOSIS — K219 Gastro-esophageal reflux disease without esophagitis: Secondary | ICD-10-CM | POA: Diagnosis present

## 2015-07-16 DIAGNOSIS — E1142 Type 2 diabetes mellitus with diabetic polyneuropathy: Secondary | ICD-10-CM | POA: Diagnosis present

## 2015-07-16 DIAGNOSIS — G4733 Obstructive sleep apnea (adult) (pediatric): Secondary | ICD-10-CM | POA: Diagnosis present

## 2015-07-16 DIAGNOSIS — Z794 Long term (current) use of insulin: Secondary | ICD-10-CM | POA: Diagnosis not present

## 2015-07-16 DIAGNOSIS — Z886 Allergy status to analgesic agent status: Secondary | ICD-10-CM | POA: Diagnosis not present

## 2015-07-16 DIAGNOSIS — Z89511 Acquired absence of right leg below knee: Secondary | ICD-10-CM | POA: Diagnosis not present

## 2015-07-16 DIAGNOSIS — Z87891 Personal history of nicotine dependence: Secondary | ICD-10-CM | POA: Diagnosis not present

## 2015-07-16 DIAGNOSIS — Z803 Family history of malignant neoplasm of breast: Secondary | ICD-10-CM | POA: Diagnosis not present

## 2015-07-16 DIAGNOSIS — E271 Primary adrenocortical insufficiency: Secondary | ICD-10-CM | POA: Diagnosis present

## 2015-07-16 DIAGNOSIS — M109 Gout, unspecified: Secondary | ICD-10-CM | POA: Diagnosis present

## 2015-07-16 DIAGNOSIS — Z79899 Other long term (current) drug therapy: Secondary | ICD-10-CM | POA: Diagnosis not present

## 2015-07-16 LAB — LIPID PANEL
CHOL/HDL RATIO: 2.9 ratio
CHOLESTEROL: 129 mg/dL (ref 0–200)
HDL: 45 mg/dL (ref >40)
LDL Cholesterol: 75 mg/dL (ref 0–99)
TRIGLYCERIDES: 46 mg/dL (ref <150)
VLDL: 9 mg/dL (ref 0–40)

## 2015-07-16 LAB — COMPREHENSIVE METABOLIC PANEL
ALT: 15 U/L — ABNORMAL LOW (ref 17–63)
AST: 28 U/L (ref 15–41)
Albumin: 3.5 g/dL (ref 3.5–5.0)
Alkaline Phosphatase: 52 U/L (ref 38–126)
Anion gap: 7 (ref 5–15)
BILIRUBIN TOTAL: 0.8 mg/dL (ref 0.3–1.2)
BUN: 12 mg/dL (ref 6–20)
CO2: 28 mmol/L (ref 22–32)
Calcium: 8.4 mg/dL — ABNORMAL LOW (ref 8.9–10.3)
Chloride: 90 mmol/L — ABNORMAL LOW (ref 101–111)
Creatinine, Ser: 0.76 mg/dL (ref 0.61–1.24)
GFR calc Af Amer: >60 mL/min (ref >60)
Glucose, Bld: 135 mg/dL — ABNORMAL HIGH (ref 65–99)
POTASSIUM: 3.3 mmol/L — AB (ref 3.5–5.1)
Sodium: 125 mmol/L — ABNORMAL LOW (ref 135–145)
TOTAL PROTEIN: 5.6 g/dL — AB (ref 6.5–8.1)

## 2015-07-16 LAB — GLUCOSE, CAPILLARY
GLUCOSE-CAPILLARY: 161 mg/dL — AB (ref 65–99)
GLUCOSE-CAPILLARY: 76 mg/dL (ref 65–99)
GLUCOSE-CAPILLARY: 186 mg/dL — AB (ref 65–99)
GLUCOSE-CAPILLARY: 125 mg/dL — AB (ref 65–99)
Glucose-Capillary: 170 mg/dL — ABNORMAL HIGH (ref 65–99)
Glucose-Capillary: 116 mg/dL — ABNORMAL HIGH (ref 65–99)
Glucose-Capillary: 155 mg/dL — ABNORMAL HIGH (ref 65–99)
Glucose-Capillary: 204 mg/dL — ABNORMAL HIGH (ref 65–99)
Glucose-Capillary: 148 mg/dL — ABNORMAL HIGH (ref 65–99)

## 2015-07-16 LAB — CBC
HEMATOCRIT: 35.3 % — AB (ref 40.0–52.0)
Hemoglobin: 11.8 g/dL — ABNORMAL LOW (ref 13.0–18.0)
MCH: 27.1 pg (ref 26.0–34.0)
MCHC: 33.4 g/dL (ref 32.0–36.0)
MCV: 81.1 fL (ref 80.0–100.0)
Platelets: 208 10*3/uL (ref 150–440)
RBC: 4.36 MIL/uL — ABNORMAL LOW (ref 4.40–5.90)
RDW: 17.2 % — AB (ref 11.5–14.5)
WBC: 11.6 10*3/uL — AB (ref 3.8–10.6)

## 2015-07-16 LAB — TROPONIN I
Troponin I: 3.9 ng/mL — ABNORMAL HIGH (ref <0.031)
Troponin I: 3.2 ng/mL — ABNORMAL HIGH (ref <0.031)

## 2015-07-16 LAB — TSH: TSH: 1.708 u[IU]/mL (ref 0.350–4.500)

## 2015-07-16 LAB — SODIUM: Sodium: 126 mmol/L — ABNORMAL LOW (ref 135–145)

## 2015-07-16 LAB — CORTISOL: Cortisol, Plasma: 5.1 ug/dL

## 2015-07-16 LAB — HEMOGLOBIN A1C: HEMOGLOBIN A1C: 6.5 % — AB (ref 4.0–6.0)

## 2015-07-16 MED ORDER — METOPROLOL SUCCINATE ER 25 MG PO TB24
25.0000 mg | ORAL_TABLET | Freq: Every day | ORAL | Status: DC
  Administered 2015-07-16 – 2015-07-17 (×2): 25 mg via ORAL
  Filled 2015-07-16 (×2): qty 1

## 2015-07-16 MED ORDER — ATORVASTATIN CALCIUM 20 MG PO TABS
40.0000 mg | ORAL_TABLET | Freq: Every day | ORAL | Status: DC
  Administered 2015-07-16: 40 mg via ORAL
  Filled 2015-07-16: qty 2

## 2015-07-16 MED ORDER — ISOSORBIDE MONONITRATE ER 30 MG PO TB24
30.0000 mg | ORAL_TABLET | Freq: Every day | ORAL | Status: DC
  Administered 2015-07-16 – 2015-07-17 (×2): 30 mg via ORAL
  Filled 2015-07-16 (×2): qty 1

## 2015-07-16 NOTE — Progress Notes (Signed)
SUBJECTIVE: The patient is resting in bed and minimally conversant. He does deny any chest pain or pressure or shortness of breath. Nursing reports that he denied chest pain throughout the night.   Filed Vitals:   07/15/15 1950 07/15/15 2251 07/16/15 0128 07/16/15 0440  BP: 112/60 123/64 131/68 150/65  Pulse: 65 66 64 58  Temp: 99.1 F (37.3 C) 98.6 F (37 C) 97.4 F (36.3 C) 97.4 F (36.3 C)  TempSrc: Oral Oral Oral   Resp: 16  16 18   Height:      Weight:      SpO2: 93% 94% 96% 99%    Intake/Output Summary (Last 24 hours) at 07/16/15 0809 Last data filed at 07/16/15 0338  Gross per 24 hour  Intake  462.5 ml  Output    700 ml  Net -237.5 ml    LABS: Basic Metabolic Panel:  Recent Labs  16/11/9603/31/17 1234 07/15/15 1843 07/16/15 0017  NA 126*  --  125*  K 3.5  --  3.3*  CL 91*  --  90*  CO2 25  --  28  GLUCOSE 201*  --  135*  BUN 13  --  12  CREATININE 0.87 0.87 0.76  CALCIUM 8.4*  --  8.4*  MG 1.5*  --   --    Liver Function Tests:  Recent Labs  07/16/15 0017  AST 28  ALT 15*  ALKPHOS 52  BILITOT 0.8  PROT 5.6*  ALBUMIN 3.5   No results for input(s): LIPASE, AMYLASE in the last 72 hours. CBC:  Recent Labs  07/15/15 1843 07/16/15 0017  WBC 15.5* 11.6*  HGB 12.3* 11.8*  HCT 36.6* 35.3*  MCV 79.6* 81.1  PLT 223 208   Cardiac Enzymes:  Recent Labs  07/15/15 1843 07/16/15 0013 07/16/15 0431  TROPONINI 0.60* 3.90* 3.20*   BNP: Invalid input(s): POCBNP D-Dimer: No results for input(s): DDIMER in the last 72 hours. Hemoglobin A1C: No results for input(s): HGBA1C in the last 72 hours. Fasting Lipid Panel:  Recent Labs  07/16/15 0017  CHOL 129  HDL 45  LDLCALC 75  TRIG 46  CHOLHDL 2.9   Thyroid Function Tests:  Recent Labs  07/16/15 0017  TSH 1.708   Anemia Panel: No results for input(s): VITAMINB12, FOLATE, FERRITIN, TIBC, IRON, RETICCTPCT in the last 72 hours.   PHYSICAL EXAM General: Well developed, well nourished, in no  acute distress HEENT:  Normocephalic and atramatic Neck:  No JVD.  Lungs: Clear bilaterally to auscultation and percussion. Heart: HRRR . Normal S1 and S2 without gallops or murmurs.  Abdomen: Bowel sounds are positive, abdomen soft and non-tender  Msk:  Back normal, normal gait. Normal strength and tone for age. Extremities: No clubbing, cyanosis or edema.   Neuro: Alert and oriented X 3. Psych:  Good affect, responds appropriately  TELEMETRY: Normal sinus rhythm with rates in the 60s  ASSESSMENT AND PLAN: ST depression noticed on cardiac monitor but not seen on EKG, following ECT therapy and a brief episode of tachycardia. The patient had no chest pain or shortness of breath and continues to be asymptomatic. Troponins elevated so far with a max of 3.90 which likely represents demand ischemia. Echocardiogram showed a normal left ventricular systolic function with EF 65%, normal wall motion, and grade 1 diastolic dysfunction. The patient is not a candidate for cardiac catheterization. Will treat medically with Plavix, aspirin, metoprolol, Lipitor and Imdur.  Okay for discharge and follow-up in the office, [facility], on Monday,  June 5 at 2:00 in the afternoon.  Active Problems:   Osteomyelitis of toe of left foot (HCC)   Essential hypertension, benign   Diabetes mellitus with neuropathy (HCC)   Addison's disease (HCC)   PTSD (post-traumatic stress disorder)   Allergic rhinitis   GERD (gastroesophageal reflux disease)   Noncompliance   ST segment depression    Gregory Bon, NP 07/16/2015 8:09 AM

## 2015-07-16 NOTE — Progress Notes (Addendum)
Patient ID: ESTEFAN PATTISON, male   DOB: [birth year], 60 y.o.   MRN: [id number] [facility] [place] [facility]   PATIENT NAME: [name]    MR#:  [id number]  DATE OF BIRTH:  02/10/1956  SUBJECTIVE:  Denies any cp Pt does not verbalize much  REVIEW OF SYSTEMS:   Review of Systems  Constitutional: Negative for fever, chills and weight loss.  HENT: Negative for ear discharge, ear pain and nosebleeds.   Eyes: Negative for blurred vision, pain and discharge.  Respiratory: Negative for sputum production, shortness of breath, wheezing and stridor.   Cardiovascular: Negative for chest pain, palpitations, orthopnea and PND.  Gastrointestinal: Negative for nausea, vomiting, abdominal pain and diarrhea.  Genitourinary: Negative for urgency and frequency.  Musculoskeletal: Negative for back pain and joint pain.  Neurological: Positive for weakness. Negative for sensory change, speech change and focal weakness.  Psychiatric/Behavioral: Negative for depression and hallucinations. The patient is not nervous/anxious.   All other systems reviewed and are negative.  Tolerating Diet:yes  DRUG ALLERGIES:   Allergies  Allergen Reactions  . Aspirin Nausea And Vomiting and Other (See Comments)    Only happens when pt takes high doses.      VITALS:  Blood pressure 110/54, pulse 56, temperature 98.5 F (36.9 C), temperature source Oral, resp. rate 16, height  (1.778 m), weight 86.183 kg (190 lb), SpO2 94 %.  PHYSICAL EXAMINATION:   Physical Exam  GENERAL:  60 y.o.-year-old patient lying in the bed with no acute distress.  EYES: Pupils equal, round, reactive to light and accommodation. No scleral icterus. Extraocular muscles intact.  HEENT: Head atraumatic, normocephalic. Oropharynx and nasopharynx clear.  NECK:  Supple, no jugular venous distention. No thyroid enlargement, no tenderness.  LUNGS: Normal breath sounds bilaterally, no wheezing, rales,  rhonchi. No use of accessory muscles of respiration.  CARDIOVASCULAR: S1, S2 normal. No murmurs, rubs, or gallops.  ABDOMEN: Soft, nontender, nondistended. Bowel sounds present. No organomegaly or mass.  EXTREMITIES: No cyanosis, clubbing or edema b/l.   Bilateral BKA  NEUROLOGIC: Cranial nerves II through XII are intact. No focal Motor or sensory deficits b/l.   PSYCHIATRIC:  patient is alert and oriented x 3.  SKIN: No obvious rash, lesion, or ulcer.   LABORATORY PANEL:  CBC  Recent Labs Lab 07/16/15 0017  WBC 11.6*  HGB 11.8*  HCT 35.3*  PLT 208    Chemistries   Recent Labs Lab 07/15/15 1234  07/16/15 0017 07/16/15 1037  NA 126*  --  125* 126*  K 3.5  --  3.3*  --   CL 91*  --  90*  --   CO2 25  --  28  --   GLUCOSE 201*  --  135*  --   BUN 13  --  12  --   CREATININE 0.87  < > 0.76  --   CALCIUM 8.4*  --  8.4*  --   MG 1.5*  --   --   --   AST  --   --  28  --   ALT  --   --  15*  --   ALKPHOS  --   --  52  --   BILITOT  --   --  0.8  --   < > = values in this interval not displayed. Cardiac Enzymes  Recent Labs Lab 07/16/15 0431  TROPONINI 3.20*   RADIOLOGY:  No results found. ASSESSMENT AND PLAN:  Gregory KelpDouglas [name] is a 60 y.o. male with a known history of Depression and catatonia [facility] today. Following electroconvulsive therapy patient's blood pressure was elevated 190/89 and telemetry monitor has revealed ST depressions. Patient was given nitroglycerin, IV labetalol and hydralazine and hospitalist team is called to admit the patient. Stat troponins were ordered and EKG was revealed by on-call cardiologist Dr. [name], who has recommended to admit the patient under observation status  #Abnormal EKG with ST depressionsAnd elevated troponin of 3.90 suggestive of acute non-Q-wave MI Patient is allergic to aspirin so received Plavix 300 mg yesterday and from now on 75 mg once daily Appreciate cardiology recommendations by  Dr. [name]. Recommends medical management of non-Q-wave MI since patient has bilateral amputation unable to do cardiac Nitroglycerin as needed Echocardiogram noted  12-lead EKG with no significant ST depressions and currently patient is a symptomatic. Continue close monitoring Patient denies any chest pain. He does not verbalize much  #Insulin requiring diabetes mellitus Diabetic and cardiac diet as tolerated, start with clear liquids and advance as tolerated Sliding scale insulin We will resume the patient's Levemir 15 units subcutaneous daily at bedtime if the patient is more awake and alert and taking diet  #Essential hypertension Currently blood pressure is stable. Continue home medication hydralazine  #History of Addison's disease Continue home medication prednisone and check cortisol level in a.m.  #Depression with catatonia Status post ECT therapy today. Follow-up with psychiatry Dr. 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 Patient has had several admissions in the hospital for chronic low sodium due to drinking fluids We'll keep him on fluid restriction Continue normal saline at 75 hour period 125--126  Case discussed with Care Management/Social Worker. Management plans discussed with the patient, family and they are in agreement.  CODE STATUS: full  DVT Prophylaxis: loveonx TOTAL TIME TAKING CARE OF THIS PATIENT: 30* minutes.  >50% time spent on counselling and coordination of care  POSSIBLE D/C IN *1-2 DAYS, DEPENDING ON CLINICAL CONDITION.  Note: This dictation was prepared with Dragon dictation along with smaller phrase technology. Any transcriptional errors that result from this process are unintentional.  [name] M.D on 07/16/2015 at 1:28 PM  Between 7am to 6pm - Pager - [phone]  After 6pm go to [email or web address] - password [email or web address] [facility]  [facility]  Office  [phone]  CC: Primary care physician; Presley RaddleAdrian Mancheno, MD

## 2015-07-16 NOTE — Progress Notes (Signed)
Dr. Allena KatzPatel notified of sodium level. Ordered for patient to be put on 1000 mL fluid restriction, and order BMP for tomorrow AM. Trudee KusterBrandi R [place]field

## 2015-07-16 NOTE — Progress Notes (Signed)
Notified MD Willis of pts troponin of 3.90. MD to place orders to trend troponin. Pt asymptomatic, VSS. Will continue to monitor.   [name], Sunday Klos M
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
## 2015-07-16 NOTE — Progress Notes (Signed)
RN received critical troponin of 0.60. MD Anne HahnWillis notified. MD to overview chart and place orders if needed. Pt asymptomatic, vss. Will continue to monitor.

## 2015-07-16 NOTE — Progress Notes (Signed)
Per Dr. Allena KatzPatel, d/c neuro checks. Trudee KusterBrandi R Mansfield

## 2015-07-16 NOTE — Progress Notes (Signed)
CSW and RN discussed patient. Per RN [facility]. CSW is signing off. CSW is available if a CSW need were to arise.   Woodroe Modehristina Kamdin Follett, MSW, LCSW-A Clinical Social Work Department [phone]

## 2015-07-17 ENCOUNTER — Inpatient Hospital Stay
Admission: AD | Admit: 2015-07-17 | Discharge: 2015-07-25 | DRG: 885 | Disposition: A | Discharge status: Home / Self Care | Payer: Medicaid Other | Source: Intra-hospital | Attending: Psychiatry | Admitting: Psychiatry

## 2015-07-17 DIAGNOSIS — Z803 Family history of malignant neoplasm of breast: Secondary | ICD-10-CM

## 2015-07-17 DIAGNOSIS — H409 Unspecified glaucoma: Secondary | ICD-10-CM | POA: Diagnosis present

## 2015-07-17 DIAGNOSIS — Z89511 Acquired absence of right leg below knee: Secondary | ICD-10-CM

## 2015-07-17 DIAGNOSIS — Z888 Allergy status to other drugs, medicaments and biological substances status: Secondary | ICD-10-CM

## 2015-07-17 DIAGNOSIS — F332 Major depressive disorder, recurrent severe without psychotic features: Secondary | ICD-10-CM | POA: Diagnosis not present

## 2015-07-17 DIAGNOSIS — E271 Primary adrenocortical insufficiency: Secondary | ICD-10-CM | POA: Diagnosis present

## 2015-07-17 DIAGNOSIS — I10 Essential (primary) hypertension: Secondary | ICD-10-CM | POA: Diagnosis present

## 2015-07-17 DIAGNOSIS — E1143 Type 2 diabetes mellitus with diabetic autonomic (poly)neuropathy: Secondary | ICD-10-CM | POA: Diagnosis present

## 2015-07-17 DIAGNOSIS — F061 Catatonic disorder due to known physiological condition: Secondary | ICD-10-CM | POA: Diagnosis present

## 2015-07-17 DIAGNOSIS — I252 Old myocardial infarction: Secondary | ICD-10-CM | POA: Diagnosis not present

## 2015-07-17 DIAGNOSIS — Z66 Do not resuscitate: Secondary | ICD-10-CM | POA: Diagnosis present

## 2015-07-17 DIAGNOSIS — M109 Gout, unspecified: Secondary | ICD-10-CM | POA: Diagnosis present

## 2015-07-17 DIAGNOSIS — Z8711 Personal history of peptic ulcer disease: Secondary | ICD-10-CM

## 2015-07-17 DIAGNOSIS — Z818 Family history of other mental and behavioral disorders: Secondary | ICD-10-CM | POA: Diagnosis not present

## 2015-07-17 DIAGNOSIS — J449 Chronic obstructive pulmonary disease, unspecified: Secondary | ICD-10-CM | POA: Diagnosis present

## 2015-07-17 DIAGNOSIS — Z9889 Other specified postprocedural states: Secondary | ICD-10-CM | POA: Diagnosis not present

## 2015-07-17 DIAGNOSIS — Z89512 Acquired absence of left leg below knee: Secondary | ICD-10-CM

## 2015-07-17 DIAGNOSIS — G4733 Obstructive sleep apnea (adult) (pediatric): Secondary | ICD-10-CM | POA: Diagnosis present

## 2015-07-17 DIAGNOSIS — Z833 Family history of diabetes mellitus: Secondary | ICD-10-CM

## 2015-07-17 DIAGNOSIS — Z801 Family history of malignant neoplasm of trachea, bronchus and lung: Secondary | ICD-10-CM | POA: Diagnosis not present

## 2015-07-17 DIAGNOSIS — Z720 Tobacco use: Secondary | ICD-10-CM | POA: Diagnosis not present

## 2015-07-17 DIAGNOSIS — E1140 Type 2 diabetes mellitus with diabetic neuropathy, unspecified: Secondary | ICD-10-CM | POA: Diagnosis present

## 2015-07-17 DIAGNOSIS — K219 Gastro-esophageal reflux disease without esophagitis: Secondary | ICD-10-CM | POA: Diagnosis present

## 2015-07-17 DIAGNOSIS — I214 Non-ST elevation (NSTEMI) myocardial infarction: Secondary | ICD-10-CM | POA: Diagnosis present

## 2015-07-17 LAB — BASIC METABOLIC PANEL
Anion gap: 8 (ref 5–15)
BUN: 7 mg/dL (ref 6–20)
CHLORIDE: 95 mmol/L — AB (ref 101–111)
CO2: 27 mmol/L (ref 22–32)
Calcium: 8 mg/dL — ABNORMAL LOW (ref 8.9–10.3)
Creatinine, Ser: 0.67 mg/dL (ref 0.61–1.24)
GFR calc Af Amer: >60 mL/min (ref >60)
GFR calc non Af Amer: >60 mL/min (ref >60)
GLUCOSE: 49 mg/dL — AB (ref 65–99)
POTASSIUM: 3.2 mmol/L — AB (ref 3.5–5.1)
Sodium: 130 mmol/L — ABNORMAL LOW (ref 135–145)

## 2015-07-17 LAB — GLUCOSE, CAPILLARY
GLUCOSE-CAPILLARY: 141 mg/dL — AB (ref 65–99)
Glucose-Capillary: 120 mg/dL — ABNORMAL HIGH (ref 65–99)
Glucose-Capillary: 69 mg/dL (ref 65–99)

## 2015-07-17 MED ORDER — NITROGLYCERIN 0.4 MG SL SUBL: 0.4000 mg | SUBLINGUAL_TABLET | SUBLINGUAL | Status: AC | PRN

## 2015-07-17 MED ORDER — ACETAMINOPHEN 325 MG PO TABS
650.0000 mg | ORAL_TABLET | Freq: Four times a day (QID) | ORAL | Status: DC | PRN
  Filled 2015-07-17: qty 2

## 2015-07-17 MED ORDER — LORATADINE 10 MG PO TABS
10.0000 mg | ORAL_TABLET | Freq: Every day | ORAL | Status: DC
  Administered 2015-07-18 – 2015-07-25 (×8): 10 mg via ORAL
  Filled 2015-07-17 (×8): qty 1

## 2015-07-17 MED ORDER — SENNOSIDES-DOCUSATE SODIUM 8.6-50 MG PO TABS: 1.0000 | ORAL_TABLET | Freq: Every evening | ORAL | Status: DC | PRN

## 2015-07-17 MED ORDER — ISOSORBIDE MONONITRATE ER 30 MG PO TB24
30.0000 mg | ORAL_TABLET | Freq: Every day | ORAL | Status: DC
  Administered 2015-07-18 – 2015-07-25 (×8): 30 mg via ORAL
  Filled 2015-07-17 (×8): qty 1

## 2015-07-17 MED ORDER — CLONAZEPAM 0.5 MG PO TABS
ORAL_TABLET | ORAL | Status: AC
  Administered 2015-07-17: 0.5 mg via ORAL
  Filled 2015-07-17: qty 1

## 2015-07-17 MED ORDER — NICOTINE 14 MG/24HR TD PT24
14.0000 mg | MEDICATED_PATCH | Freq: Every day | TRANSDERMAL | Status: DC
  Administered 2015-07-18 – 2015-07-25 (×8): 14 mg via TRANSDERMAL
  Filled 2015-07-17 (×9): qty 1

## 2015-07-17 MED ORDER — FLUDROCORTISONE ACETATE 0.1 MG PO TABS
0.2000 mg | ORAL_TABLET | Freq: Every day | ORAL | Status: DC
  Administered 2015-07-18 – 2015-07-25 (×8): 0.2 mg via ORAL
  Filled 2015-07-17 (×8): qty 2

## 2015-07-17 MED ORDER — FLUTICASONE PROPIONATE 50 MCG/ACT NA SUSP
1.0000 | Freq: Every day | NASAL | Status: DC
  Administered 2015-07-19 – 2015-07-25 (×7): 1 via NASAL
  Filled 2015-07-17: qty 16

## 2015-07-17 MED ORDER — CYANOCOBALAMIN 1000 MCG/ML IJ SOLN: 1000.0000 ug | INTRAMUSCULAR | Status: DC

## 2015-07-17 MED ORDER — CLONAZEPAM 0.5 MG PO TABS
0.5000 mg | ORAL_TABLET | Freq: Three times a day (TID) | ORAL | Status: DC
  Administered 2015-07-17 – 2015-07-25 (×23): 0.5 mg via ORAL
  Filled 2015-07-17 (×23): qty 1

## 2015-07-17 MED ORDER — TRAZODONE HCL 50 MG PO TABS
50.0000 mg | ORAL_TABLET | Freq: Every day | ORAL | Status: DC
  Administered 2015-07-17 – 2015-07-19 (×3): 50 mg via ORAL
  Filled 2015-07-17 (×3): qty 1

## 2015-07-17 MED ORDER — METOPROLOL SUCCINATE ER 25 MG PO TB24: 25.0000 mg | ORAL_TABLET | Freq: Every day | ORAL | Status: DC

## 2015-07-17 MED ORDER — FLUOXETINE HCL 20 MG PO CAPS
60.0000 mg | ORAL_CAPSULE | Freq: Every day | ORAL | Status: DC
  Administered 2015-07-18 – 2015-07-25 (×8): 60 mg via ORAL
  Filled 2015-07-17 (×8): qty 3

## 2015-07-17 MED ORDER — POTASSIUM CHLORIDE CRYS ER 20 MEQ PO TBCR
40.0000 meq | EXTENDED_RELEASE_TABLET | Freq: Once | ORAL | Status: AC
  Administered 2015-07-17: 40 meq via ORAL
  Filled 2015-07-17: qty 2

## 2015-07-17 MED ORDER — PREGABALIN 75 MG PO CAPS
75.0000 mg | ORAL_CAPSULE | Freq: Three times a day (TID) | ORAL | Status: DC
  Administered 2015-07-17 – 2015-07-25 (×23): 75 mg via ORAL
  Filled 2015-07-17 (×24): qty 1

## 2015-07-17 MED ORDER — HYDRALAZINE HCL 50 MG PO TABS: 50.0000 mg | ORAL_TABLET | Freq: Three times a day (TID) | ORAL | Status: DC

## 2015-07-17 MED ORDER — PREDNISONE 10 MG PO TABS
10.0000 mg | ORAL_TABLET | Freq: Every day | ORAL | Status: DC
  Administered 2015-07-18 – 2015-07-25 (×8): 10 mg via ORAL
  Filled 2015-07-17 (×8): qty 1

## 2015-07-17 MED ORDER — PANTOPRAZOLE SODIUM 40 MG PO TBEC
40.0000 mg | DELAYED_RELEASE_TABLET | Freq: Every day | ORAL | Status: DC
  Administered 2015-07-18 – 2015-07-25 (×8): 40 mg via ORAL
  Filled 2015-07-17 (×8): qty 1

## 2015-07-17 MED ORDER — METOPROLOL SUCCINATE ER 25 MG PO TB24
25.0000 mg | ORAL_TABLET | Freq: Every day | ORAL | Status: DC
  Administered 2015-07-18 – 2015-07-19 (×2): 25 mg via ORAL
  Filled 2015-07-17 (×3): qty 1

## 2015-07-17 MED ORDER — CLOPIDOGREL BISULFATE 75 MG PO TABS: 75.0000 mg | ORAL_TABLET | Freq: Every day | ORAL | Status: DC

## 2015-07-17 MED ORDER — INSULIN DETEMIR 100 UNIT/ML ~~LOC~~ SOLN
12.0000 [IU] | Freq: Every day | SUBCUTANEOUS | Status: DC
  Filled 2015-07-17: qty 0.12

## 2015-07-17 MED ORDER — INSULIN DETEMIR 100 UNIT/ML ~~LOC~~ SOLN
12.0000 [IU] | Freq: Every day | SUBCUTANEOUS | Status: DC
  Administered 2015-07-17 – 2015-07-18 (×2): 12 [IU] via SUBCUTANEOUS
  Filled 2015-07-17 (×3): qty 0.12

## 2015-07-17 MED ORDER — TRIPLE ANTIBIOTIC 3.5-400-5000 EX OINT
TOPICAL_OINTMENT | Freq: Every day | CUTANEOUS | Status: DC
  Administered 2015-07-19 – 2015-07-25 (×5): 1 via TOPICAL
  Filled 2015-07-17 (×6): qty 1

## 2015-07-17 MED ORDER — ATORVASTATIN CALCIUM 20 MG PO TABS
40.0000 mg | ORAL_TABLET | Freq: Every day | ORAL | Status: DC
  Administered 2015-07-18 – 2015-07-24 (×7): 40 mg via ORAL
  Filled 2015-07-17 (×7): qty 2

## 2015-07-17 MED ORDER — CLOPIDOGREL BISULFATE 75 MG PO TABS
75.0000 mg | ORAL_TABLET | Freq: Every day | ORAL | Status: DC
  Administered 2015-07-18 – 2015-07-25 (×8): 75 mg via ORAL
  Filled 2015-07-17 (×8): qty 1

## 2015-07-17 MED ORDER — ATORVASTATIN CALCIUM 40 MG PO TABS: 40.0000 mg | ORAL_TABLET | Freq: Every day | ORAL | Status: DC

## 2015-07-17 MED ORDER — LURASIDONE HCL 40 MG PO TABS
160.0000 mg | ORAL_TABLET | Freq: Every day | ORAL | Status: DC
  Administered 2015-07-18 – 2015-07-20 (×3): 160 mg via ORAL
  Filled 2015-07-17 (×3): qty 4

## 2015-07-17 MED ORDER — ISOSORBIDE MONONITRATE ER 30 MG PO TB24: 30.0000 mg | ORAL_TABLET | Freq: Every day | ORAL | Status: DC

## 2015-07-17 MED ORDER — MAGNESIUM OXIDE 400 (241.3 MG) MG PO TABS
400.0000 mg | ORAL_TABLET | Freq: Two times a day (BID) | ORAL | Status: DC
  Administered 2015-07-17 – 2015-07-25 (×16): 400 mg via ORAL
  Filled 2015-07-17 (×16): qty 1

## 2015-07-17 MED ORDER — INSULIN DETEMIR 100 UNIT/ML ~~LOC~~ SOLN
12.0000 [IU] | Freq: Every day | SUBCUTANEOUS | Status: DC
Start: 2015-07-17 — End: 2015-07-25

## 2015-07-17 MED ORDER — ALUM & MAG HYDROXIDE-SIMETH 200-200-20 MG/5ML PO SUSP: 30.0000 mL | ORAL | Status: DC | PRN

## 2015-07-17 MED ORDER — HYDRALAZINE HCL 50 MG PO TABS
25.0000 mg | ORAL_TABLET | Freq: Three times a day (TID) | ORAL | Status: DC
  Administered 2015-07-17 – 2015-07-24 (×14): 25 mg via ORAL
  Administered 2015-07-24: 22:00:00 via ORAL
  Administered 2015-07-25: 25 mg via ORAL
  Filled 2015-07-17 (×4): qty 1
  Filled 2015-07-17: qty 2
  Filled 2015-07-17: qty 1
  Filled 2015-07-17: qty 2
  Filled 2015-07-17: qty 1
  Filled 2015-07-17: qty 2
  Filled 2015-07-17 (×10): qty 1

## 2015-07-17 MED ORDER — LISINOPRIL 10 MG PO TABS
10.0000 mg | ORAL_TABLET | Freq: Every day | ORAL | Status: DC
  Administered 2015-07-18 – 2015-07-25 (×8): 10 mg via ORAL
  Filled 2015-07-17 (×8): qty 1

## 2015-07-17 MED ORDER — MAGNESIUM HYDROXIDE 400 MG/5ML PO SUSP: 15.0000 mL | Freq: Every day | ORAL | Status: DC | PRN

## 2015-07-17 MED ORDER — MAGNESIUM HYDROXIDE 400 MG/5ML PO SUSP: 30.0000 mL | Freq: Every day | ORAL | Status: DC | PRN

## 2015-07-17 NOTE — Tx Team (Signed)
Initial Interdisciplinary Treatment Plan   PATIENT STRESSORS: Health problems   PATIENT STRENGTHS: General fund of knowledge Motivation for treatment/growth   PROBLEM LIST: Problem List/Patient Goals Date to be addressed Date deferred Reason deferred Estimated date of resolution  Depressed Mood      ECT treatments                                                 DISCHARGE CRITERIA:  Ability to meet basic life and health needs Improved stabilization in mood, thinking, and/or behavior Need for constant or close observation no longer present Reduction of life-threatening or endangering symptoms to within safe limits  PRELIMINARY DISCHARGE PLAN: Attend aftercare/continuing care group Outpatient therapy Return to previous living arrangement  PATIENT/FAMIILY INVOLVEMENT: This treatment plan has been presented to and reviewed with the patient, Junious DresserDouglas E Speece.  The patient and family have been given the opportunity to ask questions and make suggestions.  Lauris Poagndrea B Evanell Redlich 07/17/2015, 6:41 PM

## 2015-07-17 NOTE — Progress Notes (Signed)
Called and notified Dr. Toni Amendlapacs that patient does not have admission orders, per Dr. Lowry Bowllapacs-he will put in orders for the patient.

## 2015-07-17 NOTE — BH Assessment (Signed)
[facility]  Admitting Physician is Dr. Toni Amendlapacs.  Attending Physician will be Dr. Toni Amendlapacs.  Patient [facility] been [facility] to room 3235, [facility] Nurse Sue LushAndrea.  Call report to [phone].  2A Staff Perrin Maltese(Abigal, RN) made aware of acceptance. Patient Access Aleene Davidson(Ursla) has pre-admitted.
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
## 2015-07-17 NOTE — Progress Notes (Signed)
Inpatient Diabetes Program Recommendations  AACE/ADA: New Consensus Statement on Inpatient Glycemic Control (2015)  Target Ranges:  Prepandial:   less than 140 mg/dL      Peak postprandial:   less than 180 mg/dL (1-2 hours)      Critically ill patients:  140 - 180 mg/dL   Results for [name], [name] (MRN [id number]) as of 07/17/2015 09:52  Ref. Range 07/17/2015 07:36  Glucose-Capillary Latest Ref Range: 65-99 mg/dL 69    [facility] after ECT therapy for Depression, Catatonia.Hx of Schizophrenia, Depression, PTSD, DM, Addison's dz  Home DM Meds: Levemir 15 units QHS  Current Insulin Orders: Levemir 15 units QHS      Novolog Sensitive Correction Scale/ SSI (0-9 units) TID AC + HS      MD- Patient with Mild Hypoglycemia this AM (CBG 69 mg/dl).  AM CBG yesterday AM was 76 mg/dl.  Please consider slight reduction of Levemir to 12 units QHS.      --Will follow patient during hospitalization--  Ambrose FinlandJeannine Johnston Jossie Smoot RN, MSN, CDE Diabetes Coordinator Inpatient Glycemic Control Team Team Pager: [phone] (8a-5p)
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
## 2015-07-18 DIAGNOSIS — F332 Major depressive disorder, recurrent severe without psychotic features: Principal | ICD-10-CM

## 2015-07-18 NOTE — [facility] Group Notes (Signed)
[facility] Group Notes:  (Nursing/MHT/Case Management/Adjunct)  Date:  07/18/2015  Time:  9:49 AM  Type of Therapy:  goal setting  Participation Level:  Did Not Attend  Twanna Hymanda C Malikye Reppond 07/18/2015, 9:49 AM

## 2015-07-18 NOTE — Progress Notes (Signed)
[name] spent most of the shift resting in bed. He remains sad and flat on approach.  He remains depressed, he took medications on shift and was compliant. He appears to be asleep in bed at this time we will continue to monitor.

## 2015-07-18 NOTE — [facility] Group Notes (Signed)
[facility] LCSW Group Therapy  07/18/2015 1:55 PM  Type of Therapy:  Group Therapy  Participation Level:  Minimal  Participation Quality:  Attentive  Affect:  Flat  Cognitive:  Alert  Insight:  Limited  Engagement in Therapy:  Limited  Modes of Intervention:  Discussion, Education, Socialization and Support  Summary of Progress/Problems: Pt will identify unhealthy thoughts and how they impact their emotions and behavior. Pt will be encouraged to discuss these thoughts, emotions and behaviors with the group. Pt attended group and stayed the entire time. He sat quietly and listened to other group members share.   Chloe Bluett L Katelynn Heidler MSW, LCSWA  07/18/2015, 1:55 PM   

## 2015-07-18 NOTE — Progress Notes (Signed)
D: Patient is alert and oriented  To person and place on the unit this shift. Patient attended  Group  today. Patient denies suicidal ideation, homicidal ideation, auditory or visual hallucinations at the present time.  A: Scheduled medications are administered to patient as per MD orders. Emotional support and encouragement are provided. Patient is maintained on q.15 minute safety checks. Patient is informed to notify staff with questions or concerns. R: No adverse medication reactions are noted. Patient is cooperative with medication administration and treatment plan today. Patient is receptive, calm and cooperative on the unit at this time. Patient does not  Interact  with others on the unit this shift. Patient contracts for safety at this time. Patient remains safe at this time.
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
## 2015-07-18 NOTE — Plan of Care (Signed)
Problem: Coping: Goal: Ability to verbalize frustrations and anger appropriately will improve Outcome: Progressing Patient more verbal this shift then other days, when asked questions his responses have improved [place] RN

## 2015-07-18 NOTE — Progress Notes (Signed)
Affect flat, speech soft and slow.  Denies SI/HI/AVH.  In scrubs.  Poor hygiene.  Asked if he wanted assistance with bath and said maybe later.  Stay in bed until after lunch.  Then up in wheelchair.  Attended group.  Up to dayroom watching tv. No interaction with peers.  Support and encouragement offered. Safety maintained.

## 2015-07-18 NOTE — [facility] Suicide Risk Assessment (Signed)
[facility] Admission Suicide Risk Assessment   Nursing information obtained from:   review of nursing notes on all wards. Demographic factors:   middle-age man with minimal social support depression and multiple medical problems Current Mental Status:   depressed but not actively psychotic denies suicidal ideation Loss Factors:   multiple major losses including a family and health Historical Factors:   history of suicidality and depression in the past Risk Reduction Factors:   outpatient treatment  Total Time spent with patient: 1 hour Principal Problem: Severe recurrent major depression without psychotic features (HCC) Diagnosis:   Patient Active Problem List   Diagnosis Date Noted  . NSTEMI (non-ST elevated myocardial infarction) (HCC) [I21.4] 07/16/2015  . Severe recurrent major depression without psychotic features (HCC) [F33.2]   . ST segment depression [R94.31] 07/15/2015  . 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 Hypokalemia [E87.6] 05/29/2015  . S/P bilateral below knee amputation (HCC) [O96.295, Z89.511] 05/29/2015  . Severe recurrent major depression with psychotic features (HCC) [F33.3] 05/29/2015  . Noncompliance [Z91.19] 05/29/2015  . Sinus bradycardia [R00.1] 09/07/2014  . Diabetic foot ulcer associated with diabetes mellitus due to underlying condition (HCC) [M84.132, L97.509] 06/12/2014  . Status post below knee amputation of left lower extremity (HCC) [G40.102] 04/05/2014  . 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 Diabetic peripheral neuropathy (HCC) [E11.42] 02/14/2014  . Allergic rhinitis [J30.9] 02/14/2014  . GERD (gastroesophageal reflux disease) [K21.9] 02/14/2014  . Constipation [K59.00] 02/14/2014  . Paranoid schizophrenia, chronic condition (HCC) [F20.0] 12/10/2013  . 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 Foot osteomyelitis, left (HCC) [M86.9] 12/05/2013  . Phantom limb pain (HCC) [G54.6] 11/15/2013   . Depression [F32.9] 11/15/2013  . Generalized anxiety disorder [F41.1] 11/15/2013  . Diabetic foot ulcer with osteomyelitis (HCC) [V25.366, E11.69, L97.509, M86.9] 09/09/2013  . PTSD (post-traumatic stress disorder) [F43.10] 09/09/2013  . Addison's disease (HCC) [E27.1] 09/22/2011  . Hyponatremia [E87.1] 03/15/2011  . Headache(784.0) [R51] 03/15/2011  . Essential hypertension, benign [I10] 12/08/2010  . Diabetes mellitus with neuropathy (HCC) [E11.40] 12/08/2010  . Osteomyelitis of toe of left foot (HCC) [M86.9]    Subjective Data: Patient denies suicidal ideation today. He remains withdrawn but his affect is slightly brighter. He is eating better. No new physical complaints. Cooperative with treatment.  Continued Clinical Symptoms:  Alcohol Use Disorder Identification Test Final Score (AUDIT): 0 The "Alcohol Use Disorders Identification Test", Guidelines for Use in Primary Care, Second Edition.  World Science writer  [facility]). Score between 0-7:  no or low risk or alcohol related problems. Score between 8-15:  moderate risk of alcohol related problems. Score between 16-19:  high risk of alcohol related problems. Score 20 or above:  warrants further diagnostic evaluation for alcohol dependence and treatment.   CLINICAL FACTORS:   Depression:   Hopelessness   Musculoskeletal: Strength & Muscle Tone: decreased Gait & Station: unable to stand Patient leans: N/A  Psychiatric Specialty Exam: Physical Exam  ROS  Blood pressure 161/60, pulse 45, temperature 98.9 F (37.2 C), temperature source Oral, resp. rate 18, height  (1.778 m), weight 86.183 kg (190 lb), SpO2 96 %.Body mass index is 27.26 kg/(m^2).  General Appearance: Casual  Eye Contact:  Fair  Speech:  Slow  Volume:  Decreased  Mood:  Depressed  Affect:  Constricted  Thought Process:  Goal Directed  Orientation:  Full (Time, Place, and Person)  Thought Content:  Logical  Suicidal Thoughts:  No  Homicidal  Thoughts:  No  Memory:  Immediate;   Fair Recent;   Fair Remote;   Fair  Judgement:  Fair  Insight:  Good  Psychomotor Activity:  Decreased  Concentration:  Concentration: Fair  Recall:  [employer]iservFair  Fund of Knowledge:  Fair  Language:  Fair  Akathisia:  No  Handed:  Right  AIMS (if indicated):     Assets:  Desire for Improvement Resilience  ADL's:  Intact  Cognition:  WNL  Sleep:  Number of Hours: 4      COGNITIVE FEATURES THAT CONTRIBUTE TO RISK:  Thought constriction (tunnel vision)    SUICIDE RISK:   Mild:  Suicidal ideation of limited frequency, intensity, duration, and specificity.  There are no identifiable plans, no associated intent, mild dysphoria and related symptoms, good self-control (both objective and subjective assessment), few other risk factors, and identifiable protective factors, including available and accessible social support.  PLAN OF CARE: We admit to psychiatric ward. 15 minute checks. Medication for depression. Monitor daily his suicidal ideation. Engage in groups and activities on the unit. Work on appropriate outpatient treatment at discharge.  I certify that inpatient services furnished can reasonably be expected to improve the patient's condition.   Mordecai RasmussenJohn Tahmir Kleckner, MD 07/18/2015, 7:02 PM

## 2015-07-19 MED ORDER — INSULIN DETEMIR 100 UNIT/ML ~~LOC~~ SOLN
8.0000 [IU] | Freq: Every day | SUBCUTANEOUS | Status: DC
  Administered 2015-07-19 – 2015-07-24 (×6): 8 [IU] via SUBCUTANEOUS
  Filled 2015-07-19 (×7): qty 0.08

## 2015-07-19 NOTE — Progress Notes (Signed)
[place] pulse was 45 manually this morning hydralazine was held, Neurosurgeonadministrative coordinator notified of low pulse rate. She instructed writer to call MD on call and notify him to maybe consult the hospitalist for further asessment. Dr Toni Amendlapacs paged at 0725am passed on report to next nurse to follow up and take over care.

## 2015-07-19 NOTE — Progress Notes (Signed)
D: Patient appears sad and depressed. Calm and cooperative with assessment Patient has a low blood sugar (61) during AM checks.  Patient was given orange juice and breakfast meal tray.  Blood Glucose level was re-checked and was within normal limits at 91.  In addition, patient has a heart rate of 45, MD notified and stat EKG was ordered. Hrt rate increased to 52-54. Patient showing no signs of acute distress. . Denies SI/HI/AV and contract for safety.  Patient up out of bed and in the dayroom with peers. A: safety has been maintained with Q15 minutes observation. Support and encouragement provided  Scheduled medication given.  R: Patient remains safe.  he is complaint with medication and group programming. Safety has been maintained Q15 and continue current POC.

## 2015-07-19 NOTE — [facility] Group Notes (Signed)
[facility] LCSW Group Therapy  07/19/2015 3:15 PM  Type of Therapy:  Group Therapy  Participation Level:  Did Not Attend  Modes of Intervention:  Discussion, Education, Socialization and Support  Summary of Progress/Problems: Emotional Regulation: Patients will identify both negative and positive emotions. They will discuss emotions they have difficulty regulating and how they impact their lives. Patients will be asked to identify healthy coping skills to combat unhealthy reactions to negative emotions.     Kylina Vultaggio L [place] Witz MSW, LCSWA  07/19/2015, 3:15 PM    

## 2015-07-19 NOTE — Progress Notes (Signed)
VS taken before medication. Patient stated he felt lightheaded. HR 42 on dynamap. 44 manuel. BP 115/61. CBG 201. MD on call notified. MD stated to hold hydralazine and recheck HR in an hour.
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
## 2015-07-20 LAB — GLUCOSE, CAPILLARY
GLUCOSE-CAPILLARY: 175 mg/dL — AB (ref 65–99)
GLUCOSE-CAPILLARY: 109 mg/dL — AB (ref 65–99)
GLUCOSE-CAPILLARY: 216 mg/dL — AB (ref 65–99)
GLUCOSE-CAPILLARY: 179 mg/dL — AB (ref 65–99)
GLUCOSE-CAPILLARY: 61 mg/dL — AB (ref 65–99)
GLUCOSE-CAPILLARY: 169 mg/dL — AB (ref 65–99)
Glucose-Capillary: 167 mg/dL — ABNORMAL HIGH (ref 65–99)
Glucose-Capillary: 70 mg/dL (ref 65–99)
Glucose-Capillary: 98 mg/dL (ref 65–99)
Glucose-Capillary: 113 mg/dL — ABNORMAL HIGH (ref 65–99)
Glucose-Capillary: 201 mg/dL — ABNORMAL HIGH (ref 65–99)
Glucose-Capillary: 144 mg/dL — ABNORMAL HIGH (ref 65–99)

## 2015-07-20 MED ORDER — METOPROLOL SUCCINATE ER 25 MG PO TB24
12.5000 mg | ORAL_TABLET | Freq: Every day | ORAL | Status: DC
  Filled 2015-07-20: qty 1

## 2015-07-20 MED ORDER — LOPERAMIDE HCL 2 MG PO CAPS
2.0000 mg | ORAL_CAPSULE | ORAL | Status: DC | PRN
  Administered 2015-07-20: 2 mg via ORAL
  Filled 2015-07-20: qty 1

## 2015-07-20 NOTE — Progress Notes (Signed)
D: Patient appears flat. Exhibiting word blocking. Denies SI/HI/AVH. States he's still feeling lightheaded from low HR. Ate snack and was visible in the milieu.  A: Medication was given with education. Hydralazine was held according to MD order. Encouragement was provided. Order for hospitalist was put in according to MD orders.  R: Patient was compliant with medication. He remains calm and cooperative. Safety maintained with 15 min checks.

## 2015-07-20 NOTE — Progress Notes (Signed)
D: Patient is alert and oriented on the unit this shift. Patient attended groups today. Patient denies suicidal ideation, homicidal ideation, auditory or visual hallucinations at the present time.  A: Scheduled medications are administered to patient as per MD orders. Emotional support and encouragement are provided. Patient is maintained on q.15 minute safety checks. Patient is informed to notify staff with questions or concerns. R: No adverse medication reactions are noted. Patient is cooperative with medication administration and treatment plan today. Patient is  calm and cooperative on the unit at this time. Patient does not  interact   with others on the unit this shift. Patient contracts for safety at this time. Patient remains safe at this time.

## 2015-07-20 NOTE — Progress Notes (Signed)
[employer] - [place] at [facility]   PATIENT NAME: [name]    MR#:  [id number]  DATE OF BIRTH:  November 17, 1955  SUBJECTIVE:   Pt. Admitted to behavioral medicine due to severe depression.  Hospitalist services contacted for Bradycardia.  Had recent NSTEMI which is being medically managed.  No complaints presently.  Hemodynamically stable.    REVIEW OF SYSTEMS:    Review of Systems  Constitutional: Negative for fever and chills.  HENT: Negative for congestion and tinnitus.   Eyes: Negative for blurred vision and double vision.  Respiratory: Negative for cough, shortness of breath and wheezing.   Cardiovascular: Negative for chest pain, orthopnea and PND.  Gastrointestinal: Negative for nausea, vomiting, abdominal pain and diarrhea.  Genitourinary: Negative for dysuria and hematuria.  Neurological: Negative for dizziness, sensory change and focal weakness.  All other systems reviewed and are negative.   Nutrition: Carb modified/Heart Healthy Tolerating Diet: Yes Tolerating PT: bedbound at baseline.      DRUG ALLERGIES:   Allergies  Allergen Reactions  . Aspirin Nausea And Vomiting and Other (See Comments)    Only happens when pt takes high doses.      VITALS:  Blood pressure 143/62, pulse 47, temperature 98.3 F (36.8 C), temperature source Oral, resp. rate 16, height  (1.778 m), weight 86.183 kg (190 lb), SpO2 92 %.  PHYSICAL EXAMINATION:   Physical Exam  GENERAL:  60 y.o.-year-old patient lying in the bed with no acute distress.  EYES: Pupils equal, round, reactive to light and accommodation. No scleral icterus. Extraocular muscles intact.  HEENT: Head atraumatic, normocephalic. Oropharynx and nasopharynx clear.  NECK:  Supple, no jugular venous distention. No thyroid enlargement, no tenderness.  LUNGS: Normal breath sounds bilaterally, no wheezing, rales, rhonchi. No use of accessory muscles of respiration.  CARDIOVASCULAR: S1, S2  normal. No murmurs, rubs, or gallops.  ABDOMEN: Soft, nontender, nondistended. Bowel sounds present. No organomegaly or mass.  EXTREMITIES: No cyanosis, + clubbing b/l.  B/l BKA's b/l.    NEUROLOGIC: Cranial nerves II through XII are intact. No focal Motor or sensory deficits b/l.  Globally weak.  PSYCHIATRIC: The patient is alert and oriented x 3. Depressed.  SKIN: No obvious rash, lesion, or ulcer.    LABORATORY PANEL:   CBC  Recent Labs Lab 07/16/15 0017  WBC 11.6*  HGB 11.8*  HCT 35.3*  PLT 208   ------------------------------------------------------------------------------------------------------------------  Chemistries   Recent Labs Lab 07/15/15 1234  07/16/15 0017  07/17/15 0516  NA 126*  --  125*  < > 130*  K 3.5  --  3.3*  --  3.2*  CL 91*  --  90*  --  95*  CO2 25  --  28  --  27  GLUCOSE 201*  --  135*  --  49*  BUN 13  --  12  --  7  CREATININE 0.87  < > 0.76  --  0.67  CALCIUM 8.4*  --  8.4*  --  8.0*  MG 1.5*  --   --   --   --   AST  --   --  28  --   --   ALT  --   --  15*  --   --   ALKPHOS  --   --  52  --   --   BILITOT  --   --  0.8  --   --   < > = values in this interval  not displayed. ------------------------------------------------------------------------------------------------------------------  Cardiac Enzymes  Recent Labs Lab 07/16/15 0431  TROPONINI 3.20*   ------------------------------------------------------------------------------------------------------------------  RADIOLOGY:  No results found.   ASSESSMENT AND PLAN:   60 year old male with past medical history of hypertension, hyperlipidemia, diabetes, depression, recent history of non-Q-wave MI, diabetic neuropathy, GERD, COPD who was admitted to behavioral medicine due to severe depression and hospitalist services were contacted for bradycardia.   1. Sinus bradycardia-clinically asymptomatic. Hemodynamically stable. -EKG showing no evidence of AV nodal block. Beta  blocker has been discontinued. -Patient would likely benefit from being on a beta blocker since his recent non-Q-wave MI and we can reintroduce some low-dose Coreg once his heart rates are close to 60. I will discontinue trazodone which can contribute to his mild bradycardia.  2. History of recent non-Q-wave MI-currently chest pain-free. No acute EKG changes. -Continue Plavix, statin, Imdur.  3. Diabetes type 2 without complication-continue Levemir. Blood sugar stable.  4. History of depression-continue care as per psychiatry. Continue Latuda, Prozac.  5. Essential hypertension-continue hydralazine, Imdur, lisinopril.  6. Tobacco abuse-continue nicotine patch.  7. GERD-continue Protonix.  8. Diabetic neuropathy-continue Lyrica  I will sign off. Please call back if any further assistance needed. Discussed with Dr. Toni Amendlapacs.   All the records are reviewed and case discussed with Care Management/Social Workerr. Management plans discussed with the patient, family and they are in agreement.  CODE STATUS: Full  TOTAL TIME TAKING CARE OF THIS PATIENT: 30 minutes.   POSSIBLE D/C unclear, DEPENDING on Psychiatric Course.    Houston SirenSAINANI,Rion Catala J M.D on 07/20/2015 at 2:16 PM  Between 7am to 6pm - Pager - [phone]  After 6pm go to [email or web address] - password [email or web address] [facility]  [facility]  Office  [phone]  CC: Primary care physician; Presley RaddleAdrian Mancheno, MD

## 2015-07-20 NOTE — Plan of Care (Signed)
Problem: Education: Goal: Mental status will improve Outcome: Progressing Patient more alert this shift [place] adviser[place]own[place] RN

## 2015-07-20 NOTE — Plan of Care (Signed)
Problem: Health Behavior/Discharge Planning: Goal: Compliance with treatment plan for underlying cause of condition will improve Outcome: Progressing Patient has been compliant with medication regimen
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
## 2015-07-20 NOTE — Progress Notes (Signed)
Recreation Therapy Notes  Date: 06.05.17 Time: 9:30 am Location: Craft Room  Group Topic: Self-expression  Goal Area(s) Addresses:  Patient will identify one color per emotion listed on wheel. Patient will verbalize one emotion experienced during session. Patient will be educated on other forms of self-expression.  Behavioral Response: Did not attend  Intervention: Emotion Wheel  Activity: Patients were given an Emotion Wheel worksheet and instructed to pick a color for each emotion listed on the wheel.  Education: LRT educated patients on other forms of self-expression.  Education Outcome: Patient did not attend group.  Clinical Observations/Feedback: Patient did not attend group.  Jacquelynn CreeGreene,Peregrine Nolt M, LRT/CTRS 07/20/2015 10:14 AM
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
## 2015-07-20 NOTE — Plan of Care (Signed)
Problem: Coping: Goal: Ability to verbalize frustrations and anger appropriately will improve Outcome: Progressing Patient becoming more verbal this shift about his frustrations Tax adviserCTownsend RN

## 2015-07-20 NOTE — [facility] Counselor (Deleted)
[facility] LCSW Group Therapy   07/20/2015 9:30am Type of Therapy: Group Therapy   Participation Level: Active   Participation Quality: Attentive, Sharing and Supportive   Affect: Depressed and Flat   Cognitive: Alert and Oriented   Insight: Developing/Improving and Engaged   Engagement in Therapy: Developing/Improving and Engaged   Modes of Intervention: Clarification, Confrontation, Discussion, Education, Exploration,  Limit-setting, Orientation, Problem-solving, Rapport Building, Dance movement psychotherapisteality Testing, Socialization and Support   Summary of Progress/Problems: Pt identified obstacles faced currently and processed barriers involved in overcoming these obstacles. Pt identified steps necessary for overcoming these obstacles and explored motivation (internal and external) for facing these difficulties head on. Pt further identified one area of concern in their lives and chose a goal to focus on for today. Pt shared with the group the pt's desire to "ride in a fancy care".  When asked by the CSW the pt reported he would prefer a [place].  When other pt's named their favorite [place] the pt shared he preferred a town car..  The pt noted the other group member's suggestions as to how to achieve the pt.'s goal and reported he understood that this may not be possible.  Pt shared that the pt was was looking forward to achieving the pt.'s goal and shared perhaps the pt could continue to hope and that he also reported he hoped to return to his group home soon to see how the other residents are.  Pt was polite and cooperative with the CSW and other group members and focused and attentive to the topics discussed and the sharing of others.     Dorothe PeaJonathan F. Trevyn Lumpkin, LCSWA, LCAS  07/20/15

## 2015-07-20 NOTE — Progress Notes (Signed)
Pt up in wheelchair part of day. Several loose stools. Immodium given with good relief. Pt unable to clean self. Dependent with ADLs. Uses wheelchair and left leg prosthesis. Pt did attend group today. Endorses depression. Denies SI. Will continue to assess and monitor for safety.

## 2015-07-21 MED ORDER — MIRTAZAPINE 15 MG PO TABS
45.0000 mg | ORAL_TABLET | Freq: Every day | ORAL | Status: DC
  Administered 2015-07-21 – 2015-07-24 (×4): 45 mg via ORAL
  Filled 2015-07-21 (×4): qty 3

## 2015-07-21 MED ORDER — ARIPIPRAZOLE 10 MG PO TABS
10.0000 mg | ORAL_TABLET | Freq: Every day | ORAL | Status: DC
  Administered 2015-07-21 – 2015-07-24 (×4): 10 mg via ORAL
  Filled 2015-07-21 (×4): qty 1

## 2015-07-21 NOTE — Progress Notes (Signed)
Patient stated to writer that he was still depressed but not as before. He appears sad and depressed in mood and affect. He denied SI,AH/VH and contracted for safety. He laid in bed, assisted to turn/change lying positions by staff and encouraged to use the urinal by his bedside. He remained medication compliant. Has not had a BM thus far.  PT  consult was called in. No Falls noted and patient reminded to use the call button for assistance. Will continue to monitor patient on Q15 min checks and as needed to ensure safety.

## 2015-07-21 NOTE — Plan of Care (Signed)
Problem: Education: Goal: Mental status will improve Outcome: Progressing Patient states that he is still depressed but slightly better than when he came in.

## 2015-07-21 NOTE — Progress Notes (Signed)
Recreation Therapy Notes  Date: 06.06.17 Time: 9:30 am Location: Craft Room  Group Topic: Goal Setting  Goal Area(s) Addresses:  Patient will identify at least one goal. Patient will identify at least one obstacle.  Behavioral Response: Did not attend  Intervention: Recovery Goal Chart  Activity: Patients were instructed to make a Recovery Goal Chart including goals, obstacles, the date they started working on their goals, and the date they achieved their goals.  Education: LRT educated patients on ways they can celebrate in a healthy way.  Education Outcome: Patient did not attend group.  Clinical Observations/Feedback: Patient did not attend group.  Jacquelynn CreeGreene,Giselle Brutus M, LRT/CTRS 07/21/2015 10:18 AM

## 2015-07-21 NOTE — Evaluation (Signed)
Physical Therapy Evaluation Patient Details Name: [name] MRN: [id number] DOB: April 25, 1955 Today's Date: 07/21/2015   History of Present Illness  Patient with recurrent severe depression who was admitted to the psychiatric ward and then transferred upstairs because of what appears to of been a myocardial infarction during ECT. Patient has returned to baseline physical condition. He denies any current suicidal ideation but remains depressed and withdrawn. PT orders received. Pt denies any history of falls.   Clinical Impression  Pt demonstrate independence with AP scoot transfer from bed to wheelchair without an assistive device. Pt demonstrates excellent bilateral UE strength during transfers. He is able to perform stand pivot transfer with minA+1 and rolling walker with L BKA prosthesis donned. Unable to don RLE prosthesis at this time due to poorly healing wound on R stump. Would maintain NWB status on RLE. Pt reports he has utilized a wheelchair as his primary means of mobility for the last year due to feelings of instability with rolling walker. Medical record indicates that pt may not have started ambulating again following R BKA. [facility] to work on ambulation in order to encourage increased independence. Pt will benefit from skilled PT services to address deficits in strength, balance, and mobility in order to return to full function at home.     Follow Up Recommendations Home health PT    Equipment Recommendations  None recommended by PT    Recommendations for Other Services       Precautions / Restrictions Precautions Precautions: Fall Restrictions Weight Bearing Restrictions: Yes RLE Weight Bearing: Non weight bearing Other Position/Activity Restrictions: No formal WB orders. However pt has a diabetic wound on R stump which is being dressed. Would avoid WB through RLE. Do not don prosthesis      Mobility  Bed Mobility Overal bed mobility: Independent                Transfers Overall transfer level: Independent Equipment used: Rolling walker (2 wheeled) Transfers: Anterior-Posterior Transfer;Stand Pivot Transfers   Stand pivot transfers: Agricultural consultantMin assist   Anterior-Posterior transfers: Independent   General transfer comment: Pt able to perform AP transfer independently from bed to wheelchair demonstrates excellent UE strength and sitting balance. With LLE donned he is able to perform a stand pivot transfer with minA+1 for balance. Pt able to perform 1-2 hops forward with rolling walker but is too unsteady to attempt further ambulation  Ambulation/Gait                Stairs            Wheelchair Mobility    Modified Rankin (Stroke Patients Only)       Balance Overall balance assessment: Needs assistance Sitting-balance support: No upper extremity supported Sitting balance-Leahy Scale: Good     Standing balance support: Bilateral upper extremity supported Standing balance-Leahy Scale: Poor Standing balance comment: Pt requires heavy UE support in standing on LLE BKA prosthesis only. Unable to don R prosthesis due to stump wound                             Pertinent Vitals/Pain Pain Assessment: No/denies pain    Home Living Family/patient expects to be discharged to:: Group home                      Prior Function Level of Independence: Needs assistance   Gait / Transfers Assistance Needed: Pt reports  he has used a wheelchair for his primary means of mobility for at least the last year. Prior to that he was ambulating but felt too unsteady with the rolling walker. Pt reports he is independent with transfers to/from wheelchair  ADL's / Homemaking Assistance Needed: Pt reports he requires assist for bathing        Hand Dominance   Dominant Hand: Right    Extremity/Trunk Assessment   Upper Extremity Assessment: Overall WFL for tasks assessed (Pt with bilateral shoulder flexion to approx  110 degrees)           Lower Extremity Assessment: RLE deficits/detail;LLE deficits/detail RLE Deficits / Details: R BKA with wound on R stump. Hip flexion and R knee flex and extension at least 4 to 4+/5. Did not attempt weightbearing through R stump due to wound LLE Deficits / Details: Pt able to don/doff L BKA prosthesis independently. He demonstrates ability to bear weight in standing through LLE and hop with walker. At least 4+/5 L hip flexion as well as L knee flexion/extension.      Communication   Communication: Other (comment) (Pt is very soft spoken)  Cognition Arousal/Alertness: Lethargic Behavior During Therapy: Flat affect Overall Cognitive Status: Within Functional Limits for tasks assessed (AOx3)                      General Comments      Exercises        Assessment/Plan    PT Assessment Patient needs continued PT services  PT Diagnosis Difficulty walking;Generalized weakness   PT Problem List Decreased strength;Decreased activity tolerance;Decreased balance;Decreased mobility;Decreased knowledge of use of DME;Decreased safety awareness  PT Treatment Interventions DME instruction;Gait training;Functional mobility training;Therapeutic activities;Therapeutic exercise;Balance training;Neuromuscular re-education;Cognitive remediation;Patient/family education;Wheelchair mobility training   PT Goals (Current goals can be found in the Care Plan section) Acute Rehab PT Goals Patient Stated Goal: To be safe with ambulation PT Goal Formulation: With patient Time For Goal Achievement: 08/04/15 Potential to Achieve Goals: Fair    Frequency Min 2X/week   Barriers to discharge        Co-evaluation               End of Session Equipment Utilized During Treatment: Gait belt Activity Tolerance: Patient tolerated treatment well Patient left: in bed;with call bell/phone within reach           Time: 1430-1447 PT Time Calculation (min) (ACUTE ONLY):  17 min   Charges:   PT Evaluation $PT Eval Low Complexity: 1 Procedure     PT G Codes:       [name] [name] PT, [name]   [name] 07/21/2015, 3:31 PM

## 2015-07-21 NOTE — Progress Notes (Signed)
Would on Right Stump cleaned with soapy water, dried with gauze, Triple antibiotic cream applied and silicone adhesive dressing placed. Patient denied pain at the wound site and remained cooperative with treatment.
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
## 2015-07-22 NOTE — Progress Notes (Signed)
D: Observed pt in wheelchair in dayroom. Patient alert and oriented x4. Patient denies SI/HI/AVH. Pt affect is sad and depressed. Pt denied going to group earlier stating "I wasn't dressed in time." Pt rated depression 3/10 and anxiety 5/10. Pt stated his mood was "pretty good." Pt forwards little but did talk briefly about his interest in crossword puzzles and detective shows. Pt dressing was off when writer observed pt, and pt stated it fell off. A: Offered active listening and support. Provided therapeutic communication. Administered scheduled medications. Replaced dressing. Encouraged pt to reach out to staff when he needs help dressing, so he can attend group. R: Pt pleasant and cooperative. Pt medication compliant. Will continue Q15 min. checks. Safety maintained.

## 2015-07-22 NOTE — Progress Notes (Signed)
Recreation Therapy Notes  Date: 06.07.17 Time: 9:30 am Location: Craft Room  Group Topic: Self-esteem  Goal Area(s) Addresses:  Patient will write at least one positive trait about self. Patient will verbalize benefit of having a healthy self-esteem.  Behavioral Response: Did not attend  Intervention: I Am  Activity: Patients were given a worksheet with the letter I on it and instructed to write as many positive traits inside the letter.  Education: LRT educated patients on ways they can increase their self-esteem.  Education Outcome: Patient did not attend group.   Clinical Observations/Feedback: Patient did not attend group.  Abbigaile Rockman M, LRT/CTRS 07/22/2015 10:21 AM 

## 2015-07-22 NOTE — Progress Notes (Signed)
Physical Therapy Treatment Patient Details Name: Gregory DresserDouglas E Devincentis MRN: [id number] DOB: 11-27-1955 Today's Date: 07/22/2015    History of Present Illness Patient with recurrent severe depression who was admitted to the psychiatric ward and then transferred upstairs because of what appears to of been a myocardial infarction during ECT. Patient has returned to baseline physical condition. He denies any current suicidal ideation but remains depressed and withdrawn. PT orders received. Pt denies any history of falls.     PT Comments    Pt in group, but agreeable to PT. PT mobilizes self from group to room requiring only minimal directional cues; cues once in room to apply brakes. Pt participates well with seated exercises for Bilateral lower extremities, core and bilateral upper extremities. Pt encouraged to perform once again later today. Continue PT to promote continued participation for progress in strength to improve all wheelchair mobility and transfers with potential return to gait once right prosthesis allowed to be donned.   Follow Up Recommendations  Home health PT     Equipment Recommendations  None recommended by PT    Recommendations for Other Services       Precautions / Restrictions Precautions Precautions: Fall Restrictions Weight Bearing Restrictions: Yes RLE Weight Bearing: Non weight bearing Other Position/Activity Restrictions: No formal WB orders. However pt has a diabetic wound on R stump which is being dressed. Would avoid WB through RLE. Do not don prosthesis    Mobility  Bed Mobility               General bed mobility comments: Not tested; in w/c and wishes to remain so  Transfers                 General transfer comment: Not testes; in w/c and wishes to remain so  Ambulation/Gait             General Gait Details: Reports non ambulatory for 8 months   Stairs            Merchant navy officerWheelchair Mobility Wheelchair Mobility Wheelchair  mobility: Yes Wheelchair propulsion: Both upper extremities Wheelchair parts: Independent Distance: 135 Wheelchair Assistance Details (indicate cue type and reason): Cues only for locking brakes before beginning exercises; needs assist to apply all the way  Modified Rankin (Stroke Patients Only)       Balance   Sitting-balance support: No upper extremity supported (L foot support) Sitting balance-Leahy Scale: Good (supported by w/c )                              Cognition Arousal/Alertness: Awake/alert Behavior During Therapy: Flat affect Overall Cognitive Status: Within Functional Limits for tasks assessed                      Exercises General Exercises - Lower Extremity Long Arc Quad: Strengthening;Both;20 reps;Seated;AROM (3 second holds) Hip ABduction/ADduction: AROM;Strengthening;Both;20 reps;Seated Straight Leg Raises: Strengthening;Both;20 reps;Seated (with abdominal isolation) Hip Flexion/Marching: AROM;Strengthening;Both;20 reps;Seated (with abdominal isolation and 3 second holds) Other Exercises Other Exercises: UE exercises for chest press, cross punch and shoulder abd with abdominal isolation 2 sets of 10 each Other Exercises: w/c push up with 3 second hold 2 sets of 5    General Comments        Pertinent Vitals/Pain Pain Assessment:  (Reports moderate pain in BLEs)    Home Living  Prior Function            PT Goals (current goals can now be found in the care plan section) Progress towards PT goals: Progressing toward goals    Frequency  Min 2X/week    PT Plan Current plan remains appropriate    Co-evaluation             End of Session   Activity Tolerance: Patient tolerated treatment well Patient left: Other (comment) (in room in w/c)     Time: 1610-9604 PT Time Calculation (min) (ACUTE ONLY): 27 min  Charges:  $Therapeutic Exercise: 23-37 mins                    G Codes:       Kristeen Miss, PTA 07/22/2015, 4:12 PM

## 2015-07-22 NOTE — [facility] Group Notes (Signed)
[facility] LCSW Group Therapy  07/22/2015 2:09 PM  Type of Therapy:  Group Therapy  Participation Level:  Did Not Attend  Modes of Intervention:  Discussion, Education, Socialization and Support  Summary of Progress/Problems: Self esteem: Patients discussed self esteem and how it impacts them. They discussed what aspects in their lives has influenced their self esteem. They were challenged to identify changes that are needed in order to improve self esteem.    Shiree Altemus L Jaja Switalski MSW, LCSWA  07/22/2015, 2:09 PM   
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
## 2015-07-22 NOTE — Plan of Care (Signed)
Problem: Education: Goal: Emotional status will improve Outcome: Progressing Pt rated depression 3/10 and stated his mood was "pretty good."

## 2015-07-22 NOTE — Tx Team (Addendum)
Interdisciplinary Treatment Plan Update (Adult)        Date: 07/21/2015   Time Reviewed: 9:30 AM   Progress in Treatment: Improving  Attending groups: Intermittently  Participating in groups: Intermittently Taking medication as prescribed: Yes  Tolerating medication: Yes  Family/Significant other contact made: No, CSW assessing for appropriate contacts  Patient understands diagnosis: Yes  Discussing patient identified problems/goals with staff: Yes  Medical problems stabilized or resolved: Yes  Denies suicidal/homicidal ideation: Yes  Issues/concerns per patient self-inventory: Yes  Other:   New problem(s) identified: N/A   Discharge Plan or Barriers: Pt will discharge to an assisted living facility for long-term residential care and will follow up with [facility] for for medication management and therapy  Reason for Continuation of Hospitalization:   Depression   Anxiety   Medication Stabilization   Comments: N/A   Estimated length of stay: 3-5 days     Patient is a 60 year old man with a history of recurrent severe depression with psychotic features who comes into the hospital initially referred from his group home because of weakness. Patient was seen in the emergency room and evaluated by emergency room physicians who could find no acute cause for it. On examination the patient demonstrates waxy flexibility. Flat affect. Very withdrawn speech very limited thinking. Patient is vague about whether he is depressed. Vague about suicidal ideation. Denies any hallucinations. Says he's been compliant with his medicine. On the other hand he says that his weakness has been getting worse and worse ever since he got out of [place] more hopeless and withdrawn. He claims he is compliant with medicine. No evidence of substance abusePatient lives in [place], [place]. Patient will benefit from crisis stabilization, medication evaluation, group therapy, and psycho  education in addition to case management for discharge planning. Patient and CSW reviewed pt's identified goals and treatment plan. Pt verbalized understanding and agreed to treatment plan.    Review of initial/current patient goals per problem list:  1. Goal(s): Patient will participate in aftercare plan   Met: Yes  Target date: 3-5 days post admission date   As evidenced by: Patient will participate within aftercare plan AEB aftercare provider and housing plan at discharge being identified.   5/26: Pt will discharge to his group home, [facility] and will follow up with [facility] for for medication management and therapy  6/6: Pt will discharge to an assisted living facility for long-term residential care and will follow up with [place] [facility] for for medication management and therapy    2. Goal (s): Patient will exhibit decreased depressive symptoms and suicidal ideations.   Met: No  Target date: 3-5 days post admission date   As evidenced by: Patient will utilize self-rating of depression at 3 or below and demonstrate decreased signs of depression or be deemed stable for discharge by MD.   5/26: Goal progressing.   5/30: Goal progressing.   6/6: Goal progressing.      3. Goal(s): Patient will demonstrate decreased signs and symptoms of anxiety.   Met: No  Target date: 3-5 days post admission date   As evidenced by: Patient will utilize self-rating of anxiety at 3 or below and demonstrated decreased signs of anxiety, or be deemed stable for discharge by MD   5/26: Goal progressing.  5/30: Goal progressing.  6/6: Goal progressing.     4. Goal(s): Patient will demonstrate decreased signs of psychosis  * Met: No * Target date: 3-5 days  post admission date  * As evidenced by: Patient will demonstrate decreased frequency of AVH or return to baseline function   5/26: Pt denies AVH  5/30: Goal progressing.  6/6: Goal progressing.      Attendees:  Patient:   Family:  Physician: Dr. Jerilee Hoh, MD 07/21/2015 9:30 AM  Nursing: , RN   07/21/2015 9:30 AM  Clinical Social Worker: Marylou Flesher, [place] 07/21/2015 9:30 AM  Nursing: Elige Radon, RN 07/21/2015 9:30 AM  Nursing: Abran Cantor, RN  07/21/2015 9:30 AM  Recreational Therapist: Everitt Amber, LRT  07/21/2015 9:30 AM  Clinical Social Worker: Dossie Arbour, LCSW 07/21/2015 9:30 AM   Marylou Flesher Elsie, LCAS  07/21/15

## 2015-07-22 NOTE — Progress Notes (Signed)
Denies SI/HI/AVH.  Affect blank.  Forwards little.  Answers yes and no questions.  Poor hygiene.  Patient was able to place on prothesis and transferred himself from bed to wheelchair on his own.  No incontinent episodes this shift.  Support and encouragement offered.   Safety maintained.  Medication compliant.

## 2015-07-23 NOTE — [facility] Group Notes (Signed)
[facility] Group Notes:  (Nursing/MHT/Case Management/Adjunct)  Date:  07/23/2015  Time:  11:28 PM  Type of Therapy:  Psychoeducational Skills  Participation Level:  Active  Participation Quality:  Appropriate  Affect:  Appropriate  Cognitive:  Appropriate  Insight:  Appropriate and Good  Engagement in Group:  Engaged  Modes of Intervention:  Discussion, Socialization and Support  Summary of Progress/Problems:  Chancy MilroyLaquanda Y Ryn Peine 07/23/2015, 11:28 PM

## 2015-07-23 NOTE — Progress Notes (Signed)
D: Pt denies SI/HI/AVH. Pt is pleasant and cooperative, affect flat and sad but brightens upon approach. Pt appears less anxious and he is interacting with peers and staff appropriately.  A: Pt was offered support and encouragement. Pt was given scheduled medications. Pt was encouraged to attend groups. Q 15 minute checks were done for safety.  R:Pt attends groups and interacts well with peers and staff. Pt is taking medication. Pt has no complaints.Pt receptive to treatment and safety maintained on unit.   

## 2015-07-23 NOTE — [facility] Group Notes (Signed)
[facility] LCSW Group Therapy  07/23/2015 3:00 PM  Type of Therapy:  Group Therapy  Participation Level:  Did Not Attend  Modes of Intervention:  Discussion, Education, Socialization and Support  Summary of Progress/Problems: Balance in life: Patients will discuss the concept of balance and how it looks and feels to be unbalanced. Pt will identify areas in their life that is unbalanced and ways to become more balanced.    Allyah Heather L Octavius Shin MSW, LCSWA  07/23/2015, 3:00 PM   

## 2015-07-23 NOTE — Progress Notes (Signed)
Recreation Therapy Notes  Date: 06.08.17 Time: 9:30 am Location: Craft Room  Group Topic: Leisure Education  Goal Area(s) Addresses:  Patient will identify things they are grateful for. Patient will verbalize why it is important to be grateful.  Behavioral Response: Did not attend  Intervention: Grateful Wheel  Activity: Patients were given an I Am Grateful For worksheet and instructed to write things they are grateful for under each category.  Education: LRT educated patients on why it is important to be grateful.  Education Outcome: Patient did not attend group.  Clinical Observations/Feedback: Patient did not attend group.  Luceal Hollibaugh M, LRT/CTRS 07/23/2015 10:27 AM 
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
## 2015-07-23 NOTE — Progress Notes (Signed)
Denies SI/HI/AVH.  Affect a little brighter.  Noted smiling occasionally.  Utilizes wheelchair to get around unit.  Continues to transfer from bed to the wheelchair without assistance.  Support and encouragement offered.  Safety maintained. Medication and group compliant.

## 2015-07-24 MED ORDER — LISINOPRIL 10 MG PO TABS: 10.0000 mg | ORAL_TABLET | Freq: Every day | ORAL | Status: AC

## 2015-07-24 MED ORDER — INSULIN DETEMIR 100 UNIT/ML ~~LOC~~ SOLN: 8.0000 [IU] | Freq: Every day | SUBCUTANEOUS | Status: AC

## 2015-07-24 MED ORDER — MAGNESIUM OXIDE 400 (241.3 MG) MG PO TABS
400.0000 mg | ORAL_TABLET | Freq: Two times a day (BID) | ORAL | Status: AC
Start: 2015-07-24 — End: ?

## 2015-07-24 MED ORDER — MIRTAZAPINE 45 MG PO TABS: 45.0000 mg | ORAL_TABLET | Freq: Every day | ORAL | Status: AC

## 2015-07-24 MED ORDER — HYDRALAZINE HCL 25 MG PO TABS: 25.0000 mg | ORAL_TABLET | Freq: Three times a day (TID) | ORAL | Status: AC

## 2015-07-24 MED ORDER — NICOTINE 14 MG/24HR TD PT24: 14.0000 mg | MEDICATED_PATCH | Freq: Every day | TRANSDERMAL | Status: AC

## 2015-07-24 MED ORDER — FLUOXETINE HCL 20 MG PO CAPS: 60.0000 mg | ORAL_CAPSULE | Freq: Every day | ORAL | Status: AC

## 2015-07-24 MED ORDER — PANTOPRAZOLE SODIUM 40 MG PO TBEC
40.0000 mg | DELAYED_RELEASE_TABLET | Freq: Every day | ORAL | Status: AC
Start: 2015-07-24 — End: ?

## 2015-07-24 MED ORDER — SENNOSIDES-DOCUSATE SODIUM 8.6-50 MG PO TABS: 1.0000 | ORAL_TABLET | Freq: Every evening | ORAL | Status: AC | PRN

## 2015-07-24 MED ORDER — PREDNISONE 10 MG PO TABS
10.0000 mg | ORAL_TABLET | Freq: Every day | ORAL | Status: AC
Start: 2015-07-24 — End: ?

## 2015-07-24 MED ORDER — ATORVASTATIN CALCIUM 40 MG PO TABS: 40.0000 mg | ORAL_TABLET | Freq: Every day | ORAL | Status: AC

## 2015-07-24 MED ORDER — CLONAZEPAM 0.5 MG PO TABS: 0.5000 mg | ORAL_TABLET | Freq: Three times a day (TID) | ORAL | Status: AC

## 2015-07-24 MED ORDER — LORATADINE 10 MG PO TABS: 10.0000 mg | ORAL_TABLET | Freq: Every day | ORAL | Status: DC

## 2015-07-24 MED ORDER — FLUDROCORTISONE ACETATE 0.1 MG PO TABS
0.2000 mg | ORAL_TABLET | Freq: Every day | ORAL | Status: AC
Start: 2015-07-24 — End: ?

## 2015-07-24 MED ORDER — CLOPIDOGREL BISULFATE 75 MG PO TABS
75.0000 mg | ORAL_TABLET | Freq: Every day | ORAL | Status: AC
Start: 2015-07-24 — End: ?

## 2015-07-24 MED ORDER — ISOSORBIDE MONONITRATE ER 30 MG PO TB24: 30.0000 mg | ORAL_TABLET | Freq: Every day | ORAL | Status: AC

## 2015-07-24 MED ORDER — NITROGLYCERIN 0.4 MG SL SUBL: 0.4000 mg | SUBLINGUAL_TABLET | SUBLINGUAL | Status: DC | PRN

## 2015-07-24 MED ORDER — FLUTICASONE PROPIONATE 50 MCG/ACT NA SUSP: 1.0000 | Freq: Every day | NASAL | Status: AC

## 2015-07-24 MED ORDER — PREGABALIN 75 MG PO CAPS: 75.0000 mg | ORAL_CAPSULE | Freq: Three times a day (TID) | ORAL | Status: AC

## 2015-07-24 MED ORDER — CYANOCOBALAMIN 1000 MCG/ML IJ SOLN: 1000.0000 ug | INTRAMUSCULAR | Status: AC
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
## 2015-07-24 NOTE — BHH Group Notes (Signed)
[facility] LCSW Group Therapy   07/24/2015 1pm  Type of Therapy: Group Therapy   Participation Level: Did Not Attend. Patient invited to participate but declined.    Morgane Joerger F. Arora Coakley, MSW, LCSWA, LCAS   

## 2015-07-24 NOTE — Progress Notes (Signed)
Recreation Therapy Notes  Date: 06.09.17 Time: 9:30 am Location: Craft Room  Group Topic: Coping Skills  Goal Area(s) Addresses:  Patient will participate in coping skill. Patient will verbalize benefit of art as a coping skill.  Behavioral Response: Did not attend  Intervention: Coloring  Activity: Patients were given coloring sheets to color and were asked to think about the emotions they were feeling and what their mind was focused on.  Education: LRT educated patients on healthy coping skills.   Education Outcome: Patient did not attend group.   Clinical Observations/Feedback: Patient did not attend group.  Jacquelynn CreeGreene,Torry Istre M, LRT/CTRS 07/24/2015 10:23 AM

## 2015-07-24 NOTE — BHH Group Notes (Addendum)
[name], LCSW Social Worker Signed Psychiatry [facility] Notes 07/24/2015 11:38 PM    Expand All Collapse All   [facility] LCSW Group Therapy   07/24/2015 1:15 PM   Type of Therapy: Group Therapy  Participation Level: Active   Participation Quality: Attentive, Sharing and Supportive   Affect: Depressed and Flat   Cognitive: Alert and Oriented   Insight: Developing/Improving and Engaged   Engagement in Therapy: Developing/Improving and Engaged   Modes of Intervention: Clarification, Confrontation, Discussion, Education, Exploration, Limit-setting, Orientation, Problem-solving, Rapport Building, Dance movement psychotherapisteality Testing, Socialization and Support   Summary of Progress/Problems: The topic for today was feelings about relapse. Pt was to discuss what relapse prevention is to them and identified triggers that they are on the path to relapse. Pt was to their feeling towards relapse and was able to relate to peers. Pt was to discuss coping skills that can be used for relapse prevention.  Pt was being assessed during group time and thus was not able to attend the group through no fault of his own     [name], LCSWA, LCAS  07/24/15

## 2015-07-24 NOTE — B[facility] Suicide Risk Assessment (Signed)
[facility] Discharge Suicide Risk Assessment   Principal Problem: Severe recurrent major depression without psychotic features [facility]) Discharge Diagnoses:  Patient Active Problem List   Diagnosis Date Noted  . NSTEMI (non-ST elevated myocardial infarction) (HCC) [I21.4] 07/16/2015  . Severe recurrent major depression without psychotic features (HCC) [F33.2]   . ST segment depression [R94.31] 07/15/2015  . 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 Hypokalemia [E87.6] 05/29/2015  . S/P bilateral below knee amputation (HCC) [Z61.096, Z89.511] 05/29/2015  . Severe recurrent major depression with psychotic features (HCC) [F33.3] 05/29/2015  . Noncompliance [Z91.19] 05/29/2015  . Sinus bradycardia [R00.1] 09/07/2014  . Diabetic foot ulcer associated with diabetes mellitus due to underlying condition (HCC) [E45.409, L97.509] 06/12/2014  . Status post below knee amputation of left lower extremity (HCC) [W11.914] 04/05/2014  . 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 Diabetic peripheral neuropathy (HCC) [E11.42] 02/14/2014  . Allergic rhinitis [J30.9] 02/14/2014  . GERD (gastroesophageal reflux disease) [K21.9] 02/14/2014  . Constipation [K59.00] 02/14/2014  . Paranoid schizophrenia, chronic condition (HCC) [F20.0] 12/10/2013  . 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 Foot osteomyelitis, left (HCC) [M86.9] 12/05/2013  . Phantom limb pain (HCC) [G54.6] 11/15/2013  . Depression [F32.9] 11/15/2013  . Generalized anxiety disorder [F41.1] 11/15/2013  . Diabetic foot ulcer with osteomyelitis (HCC) [N82.956, E11.69, L97.509, M86.9] 09/09/2013  . PTSD (post-traumatic stress disorder) [F43.10] 09/09/2013  . Addison's disease (HCC) [E27.1] 09/22/2011  . Hyponatremia [E87.1] 03/15/2011  . Headache(784.0) [R51] 03/15/2011  . Essential hypertension, benign [I10] 12/08/2010  . Diabetes mellitus with neuropathy (HCC) [E11.40] 12/08/2010  .  Osteomyelitis of toe of left foot (HCC) [M86.9]     Total Time spent with patient: 30 minutes  Musculoskeletal: Strength & Muscle Tone: decreased Gait & Station: unable to stand Patient leans: N/A  Psychiatric Specialty Exam: ROS  Blood pressure 149/70, pulse 46, temperature 97.8 F (36.6 C), temperature source Oral, resp. rate 16, height  (1.778 m), weight 86.183 kg (190 lb), SpO2 95 %.Body mass index is 27.26 kg/(m^2).  General Appearance: Disheveled  Eye Contact::  Minimal  Speech:  Slow409  Volume:  Decreased  Mood:  Euthymic  Affect:  Constricted  Thought Process:  Goal Directed  Orientation:  Full (Time, Place, and Person)  Thought Content:  Logical  Suicidal Thoughts:  No  Homicidal Thoughts:  No  Memory:  Immediate;   Fair Recent;   Fair Remote;   Fair  Judgement:  Fair  Insight:  Fair  Psychomotor Activity:  Decreased  Concentration:  Fair  Recall:  [employer] of Knowledge:Fair  Language: Fair  Akathisia:  No  Handed:  Right  AIMS (if indicated):     Assets:  Desire for Improvement Financial Resources/Insurance Housing Resilience  Sleep:  Number of Hours: 7.15  Cognition: Impaired,  Mild  ADL's:  Impaired   Mental Status Per Nursing Assessment::   On Admission:     Demographic Factors:  Male, Divorced or widowed, Caucasian, Low socioeconomic status and Living alone  Loss Factors: Decline in physical health  Historical Factors: Prior suicide attempts  Risk Reduction Factors:   Positive therapeutic relationship  Continued Clinical Symptoms:  Depression:   Anhedonia Hopelessness  Cognitive Features That Contribute To Risk:  Loss of executive function    Suicide Risk:  Mild:  Suicidal ideation of limited frequency, intensity, duration, and specificity.  There are no identifiable plans, no associated intent, mild dysphoria and related symptoms, good self-control (both objective and subjective assessment),  few other risk factors, and  identifiable protective factors, including available and accessible social support.    Plan Of Care/Follow-up recommendations:  Activity:  Activity as tolerated with encouragement to improve his strength his transfer and his self-care Diet:  Carbohydrate controlled diet controlled diabetes Other:  Patient will be given arrangements to follow-up with [facility] although social work is also looking into an active team.  Mordecai RasmussenJohn Yona Stansbury, MD 07/24/2015, 12:07 PM

## 2015-07-24 NOTE — [facility] Group Notes (Signed)
Mercy RidingJonathan F Jobina Maita, LCSW Social Worker Signed Psychiatry [facility] 07/20/2015 4:16 PM    Expand All Collapse All   [facility] LCSW Group Therapy   07/20/2015 9:30am Type of Therapy: Group Therapy   Participation Level: Active   Participation Quality: Attentive, Sharing and Supportive   Affect: Depressed and Flat   Cognitive: Alert and Oriented   Insight: Developing/Improving and Engaged   Engagement in Therapy: Developing/Improving and Engaged   Modes of Intervention: Clarification, Confrontation, Discussion, Education, Exploration,  Limit-setting, Orientation, Problem-solving, Rapport Building, Dance movement psychotherapisteality Testing, Socialization and Support   Summary of Progress/Problems: Pt identified obstacles faced currently and processed barriers involved in overcoming these obstacles. Pt identified steps necessary for overcoming these obstacles and explored motivation (internal and external) for facing these difficulties head on. Pt further identified one area of concern in their lives and chose a goal to focus on for today. Pt shared with the group the pt's desire to "ride in a fancy care". When asked by the CSW the pt reported he would prefer a [place]. When other pt's named their favorite [place] the pt shared he preferred a town car.. The pt noted the other group member's suggestions as to how to achieve the pt.'s goal and reported he understood that this may not be possible. Pt shared that the pt was was looking forward to achieving the pt.'s goal and shared perhaps the pt could continue to hope and that he also reported he hoped to return to his group home soon to see how the other residents are. Pt was polite and cooperative with the CSW and other group members and focused and attentive to the topics discussed and the sharing of others.    Dorothe PeaJonathan F. Riyanna Crutchley, LCSWA, LCAS  07/20/15

## 2015-07-24 NOTE — Progress Notes (Signed)
D: Pt denies SI/HI/AVH. Pt is pleasant and cooperative., affect is flat and sad. Pt appears less anxious and he is interacting with peers and staff appropriately. Patient is able to transfer self from chair to bed vice vasa. No acute distress noted, A: Pt was offered support and encouragement. Pt was given scheduled medications. Pt was encouraged to attend groups. Q 15 minute checks were done for safety.  R:Pt attends groups and interacts well with peers and staff. Pt is taking medication. Pt has no complaints.Pt receptive to treatment and safety maintained on unit.

## 2015-07-24 NOTE — Tx Team (Signed)
Interdisciplinary Treatment Plan Update (Adult)        Date: 07/21/2015   Time Reviewed: 9:30 AM   Progress in Treatment: Improving  Attending groups: Intermittently  Participating in groups: Intermittently Taking medication as prescribed: Yes  Tolerating medication: Yes  Family/Significant other contact made: Yes, CSW spoke to the pt's group home manager Patient understands diagnosis: Yes  Discussing patient identified problems/goals with staff: Yes  Medical problems stabilized or resolved: Yes  Denies suicidal/homicidal ideation: Yes  Issues/concerns per patient self-inventory: Yes  Other:   New problem(s) identified: N/A   Discharge Plan or Barriers: Pt will [facility] for [facility] residential care and will follow up with [facility] Team for for medication management and therapy  Reason for Continuation of Hospitalization:   Depression   Anxiety   Medication Stabilization   Comments: N/A   Estimated length of stay: 3-5 days     Patient is a 60 year old man with a history of recurrent severe depression with psychotic features who comes into the hospital initially referred from his group home because of weakness. Patient was seen in the emergency room and evaluated by emergency room physicians who could find no acute cause for it. On examination the patient demonstrates waxy flexibility. Flat affect. Very withdrawn speech very limited thinking. Patient is vague about whether he is depressed. Vague about suicidal ideation. Denies any hallucinations. Says he's been compliant with his medicine. On the other hand he says that his weakness has been getting worse and worse ever since he got out of [place] more hopeless and withdrawn. He claims he is compliant with medicine. No evidence of substance abusePatient lives in [place], [place]. Patient will benefit from crisis stabilization, medication evaluation,  group therapy, and psycho education in addition to case management for discharge planning. Patient and CSW reviewed pt's identified goals and treatment plan. Pt verbalized understanding and agreed to treatment plan.    Review of initial/current patient goals per problem list:  1. Goal(s): Patient will participate in aftercare plan   Met: Yes  Target date: 3-5 days post admission date   As evidenced by: Patient will participate within aftercare plan AEB aftercare provider and housing plan at discharge being identified.   5/26: Pt will discharge to his group home, [place] and will follow up with [facility] for for medication management and therapy  6/6: Pt will discharge to an assisted living facility for long-term residential care and will follow up with [facility] for for medication management and therapy  6/9: Pt will discharge to his group home, [place], for long-term residential care and will follow up with [facility] Team for for medication management and therapy    2. Goal (s): Patient will exhibit decreased depressive symptoms and suicidal ideations.   Met: Adequate for discharge per MD.  Target date: 3-5 days post admission date   As evidenced by: Patient will utilize self-rating of depression at 3 or below and demonstrate decreased signs of depression or be deemed stable for discharge by MD.   5/26: Goal progressing.   5/30: Goal progressing.   6/6: Goal progressing.  6/9: Adequate for discharge per MD. Pt denies SI/HI.  Pt reports he is safe for discharge.      3. Goal(s): Patient will demonstrate decreased signs and symptoms of anxiety.   Met: No  Target date: 3-5 days post admission date   As evidenced by: Patient will  utilize self-rating of anxiety at 3 or below and demonstrated decreased signs of anxiety, or be deemed stable for discharge by MD   5/26: Goal progressing.  5/30: Goal progressing.  6/6: Goal  progressing.  6/9: Adequate for discharge per MD.  Pt reports baseline symptoms of anxiety     4. Goal(s): Patient will demonstrate decreased signs of psychosis  * Met: Adequate for discharge per MD. * Target date: 3-5 days post admission date  * As evidenced by: Patient will demonstrate decreased frequency of AVH or return to baseline function   5/26: Pt denies AVH  5/30: Goal progressing.  6/6: Goal progressing.  6/9: Adequate for discharge per MD. Pt denies AVH.   Attendees:  Patient: Family:  Physician: Dr. Algie Coffer , MD  07/24/2015 9:30 AM  Nursing: Elige Radon, RN  07/24/2015 9:30 AM  Clinical Social Worker: Marylou Flesher, Wink 07/24/2015 9:30 AM        07/24/2015 9:30 AM  Other: 07/24/2015 9:30 AM  Other: 07/24/2015 9:30 AM  Other: 07/24/2015 9:30 AM   Alphonse Guild. Jaime Dome, LCSWA, LCAS  07/24/15

## 2015-07-24 NOTE — Progress Notes (Signed)
Affect brighter today.  Up out of bed right after breakfast today.  Attended all groups except for one due to fact he was meeting with the ACT team.  Denies SI/HI/AVH.  Up to dayroom watching TV with peers.  Medication compliant.  Support and encouragement offered.  Safety maintained.

## 2015-07-24 NOTE — [facility] Group Notes (Signed)
[facility] Group Notes:  (Nursing/MHT/Case Management/Adjunct)  Date:  07/24/2015  Time:  4:15 PM  Type of Therapy:  Psychoeducational Skills  Participation Level:  Active  Participation Quality:  Appropriate  Affect:  Appropriate  Cognitive:  Appropriate  Insight:  Appropriate  Engagement in Group:  Engaged  Modes of Intervention:  Discussion and Education  Summary of Progress/Problems:  [name] Ellison 07/24/2015, 4:15 PM

## 2015-07-24 NOTE — [place] FL2 (Signed)
[place] MEDICAID FL2 LEVEL OF CARE SCREENING TOOL     IDENTIFICATION  Patient Name: [name] Birthdate: 19-Aug-1955 Sex: male Admission Date (Current Location): 07/17/2015  [place] and [place] Number:  [name] [id number] [facility] Facility and Address:  [facility], [address] [place], [name] [address]      Provider Number: [id number]  Attending Physician Name and Address:  Audery Amel, MD  Relative Name and Phone Number:  [name] [phone]    Current Level of Care: Hospital Recommended Level of Care: Family Care Home Prior Approval Number:    Date Approved/Denied:   PASRR Number:    Discharge Plan: Other (Comment) [facility])    Current Diagnoses: Patient Active Problem List   Diagnosis Date Noted  . NSTEMI (non-ST elevated myocardial infarction) (HCC) 07/16/2015  . Severe recurrent major depression without psychotic features (HCC)   . ST segment depression 07/15/2015  . Catatonia (HCC) 07/08/2015  . Hypokalemia 05/29/2015  . S/P bilateral below knee amputation (HCC) 05/29/2015  . Severe recurrent major depression with psychotic features (HCC) 05/29/2015  . Noncompliance 05/29/2015  . Sinus bradycardia 09/07/2014  . Diabetic foot ulcer associated with diabetes mellitus due to underlying condition (HCC) 06/12/2014  . 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 Diabetic peripheral neuropathy (HCC) 02/14/2014  . Allergic rhinitis 02/14/2014  . GERD (gastroesophageal reflux disease) 02/14/2014  . Constipation 02/14/2014  . Paranoid schizophrenia, chronic condition (HCC) 12/10/2013  . S/P Chopart's amputation (HCC) 12/10/2013  . Foot osteomyelitis, left (HCC) 12/05/2013  . Phantom limb pain (HCC) 11/15/2013  . Depression 11/15/2013  . Generalized anxiety disorder 11/15/2013  . Diabetic foot  ulcer with osteomyelitis (HCC) 09/09/2013  . PTSD (post-traumatic stress disorder) 09/09/2013  . Addison's disease (HCC) 09/22/2011  . Hyponatremia 03/15/2011  . Headache(784.0) 03/15/2011  . Essential hypertension, benign 12/08/2010  . Diabetes mellitus with neuropathy (HCC) 12/08/2010  . Osteomyelitis of toe of left foot (HCC)     Orientation RESPIRATION BLADDER Height & Weight     Self, Time, Situation, Place  Normal Continent Weight: 190 lb (86.183 kg) Height:   (177.8 cm)  BEHAVIORAL SYMPTOMS/MOOD NEUROLOGICAL BOWEL NUTRITION STATUS      Continent (Pt has problems at times with continence)    AMBULATORY STATUS COMMUNICATION OF NEEDS Skin   Supervision Verbally Normal                       Personal Care Assistance Level of Assistance  Dressing     Dressing Assistance: Limited assistance     Functional Limitations Info             SPECIAL CARE FACTORS FREQUENCY                       Contractures Contractures Info: Not present    Additional Factors Info                  Current Medications (07/24/2015):  This is the current hospital active medication list Current Facility-Administered Medications  Medication Dose Route Frequency Provider Last Rate Last Dose  . acetaminophen (TYLENOL) tablet 650 mg  650 mg Oral Q6H PRN Audery Amel, MD      . alum & mag hydroxide-simeth (MAALOX/MYLANTA) 200-200-20 MG/5ML suspension 30 mL  30 mL Oral Q4H PRN Audery Amel, MD      .  ARIPiprazole (ABILIFY) tablet 10 mg  10 mg Oral QHS Audery Amel, MD   10 mg at 07/23/15 2232  . atorvastatin (LIPITOR) tablet 40 mg  40 mg Oral q1800 Audery Amel, MD   40 mg at 07/23/15 1752  . clonazePAM (KLONOPIN) tablet 0.5 mg  0.5 mg Oral TID Audery Amel, MD   0.5 mg at 07/24/15 0940  . clopidogrel (PLAVIX) tablet 75 mg  75 mg Oral Daily Audery Amel, MD   75 mg at 07/24/15 0940  . cyanocobalamin ((VITAMIN B-12)) injection 1,000 mcg  1,000 mcg Intramuscular Q30  days Audery Amel, MD      . fludrocortisone (FLORINEF) tablet 0.2 mg  0.2 mg Oral Daily Audery Amel, MD   0.2 mg at 07/24/15 0940  . FLUoxetine (PROZAC) capsule 60 mg  60 mg Oral Daily Audery Amel, MD   60 mg at 07/24/15 0940  . fluticasone (FLONASE) 50 MCG/ACT nasal spray 1 spray  1 spray Each Nare Daily Audery Amel, MD   1 spray at 07/24/15 0940  . hydrALAZINE (APRESOLINE) tablet 25 mg  25 mg Oral Q8H Audery Amel, MD   25 mg at 07/24/15 1410  . insulin detemir (LEVEMIR) injection 8 Units  8 Units Subcutaneous QHS Audery Amel, MD   8 Units at 07/23/15 2233  . isosorbide mononitrate (IMDUR) 24 hr tablet 30 mg  30 mg Oral Daily Audery Amel, MD   30 mg at 07/24/15 0940  . lisinopril (PRINIVIL,ZESTRIL) tablet 10 mg  10 mg Oral Daily Audery Amel, MD   10 mg at 07/24/15 0940  . loperamide (IMODIUM) capsule 2 mg  2 mg Oral PRN Audery Amel, MD   2 mg at 07/20/15 1424  . loratadine (CLARITIN) tablet 10 mg  10 mg Oral Daily Audery Amel, MD   10 mg at 07/24/15 0940  . magnesium hydroxide (MILK OF MAGNESIA) suspension 15 mL  15 mL Oral Daily PRN Audery Amel, MD      . magnesium hydroxide (MILK OF MAGNESIA) suspension 30 mL  30 mL Oral Daily PRN Audery Amel, MD      . magnesium oxide (MAG-OX) tablet 400 mg  400 mg Oral BID Audery Amel, MD   400 mg at 07/24/15 0940  . mirtazapine (REMERON) tablet 45 mg  45 mg Oral QHS Audery Amel, MD   45 mg at 07/23/15 2231  . nicotine (NICODERM CQ - dosed in mg/24 hours) patch 14 mg  14 mg Transdermal Daily Audery Amel, MD   14 mg at 07/24/15 0949  . pantoprazole (PROTONIX) EC tablet 40 mg  40 mg Oral Daily Audery Amel, MD   40 mg at 07/24/15 0940  . predniSONE (DELTASONE) tablet 10 mg  10 mg Oral Q breakfast Audery Amel, MD   10 mg at 07/24/15 0940  . pregabalin (LYRICA) capsule 75 mg  75 mg Oral TID Audery Amel, MD   75 mg at 07/24/15 0940  . senna-docusate (Senokot-S) tablet 1 tablet  1 tablet Oral QHS PRN Audery Amel, MD      . TRIPLE ANTIBIOTIC [phone] OINT   Topical Daily Audery Amel, MD   1 application at 07/24/15 0940     Discharge Medications: Please see discharge summary for a list of discharge medications.  Relevant Imaging Results:  Relevant Lab Results:   Additional Information Slight wound on right stump, but  is healing well.   Dorothe PeaJonathan F Dorien Mayotte, LCSW

## 2015-07-25 NOTE — Progress Notes (Signed)
Patient's discharge instructions and discharge medications discussed with patient. He verbalized understanding. His belongings given to patient and he did not voice any concerns. Patient was wheeled to the lobby where Group Home staff was waiting for him.

## 2015-07-25 NOTE — Progress Notes (Signed)
  [facility] Discharge [facility] :  Will you be returning to the same living situation after discharge:  Yes,  group home ] At discharge, do you have transportation home?: Yes,  group home  Do you have the ability to pay for your medications: Yes,  insurance, income  Release of information consent forms completed and in the chart;  Patient's signature needed at discharge.  Patient to Follow up at: Follow-up Information    Go to [facility].   Why:  [facility] will contact you by Wednesday 07/29/15 in regard to future services with their ACT Team for your hospital follow up for medication managment, and therapy   Contact information:   [address] [place], [place] [address] Ph: [phone] Crisis Line: [phone] Fax: [phone]      Follow up with [facility].   Why:  Please arrive to the walk-in clinic at 8am on Monday June 12th, 2017 for your hospital follow for medication management and therapy   Contact information:   [address] [place] [address] Ph: [phone] Fax: [phone]      [facility] .   Why:  Please arrive between the hours of 9am-5pm on the day of discharge to be admitted for long-term residential treatment   Contact information:   [address]  [place], [place] [address] [phone]  Fax: [phone]       Next level of care provider has access to [facility] Link:no  Safety Planning and Suicide Prevention discussed: Yes,  with patient and group home   Have you used any form of tobacco in the last 30 days? (Cigarettes, Smokeless Tobacco, Cigars, and/or Pipes): Yes  Has patient been referred to the Quitline?: Patient refused referral  Patient has been referred for addiction treatment: N/A  Rondall Allegraandace L Angelena Sand MSW, LCSWA  07/25/2015, 9:45 AM

## 2015-07-25 NOTE — [place] FL2 (Signed)
[place] MEDICAID FL2 LEVEL OF CARE SCREENING TOOL     IDENTIFICATION  Patient Name: [name] Birthdate: 12/19/1955 Sex: male Admission Date (Current Location): 07/17/2015  [place]  [name] [id number] [facility] Facility and Address:  [facility], [address], [place], [place] [address]      Provider Number: [id number]  Attending Physician Name and Address:  Audery AmelJohn T Clapacs, MD  Relative Name and Phone Number:  Francesca JewettLori Kelley [phone]    Current Level of Care: Hospital Recommended Level of Care: Family Care Home Prior Approval Number:    Date Approved/Denied:   PASRR Number:    Discharge Plan: Other (Comment) [name])    Current Diagnoses: Patient Active Problem List   Diagnosis Date Noted  . NSTEMI (non-ST elevated myocardial infarction) (HCC) 07/16/2015  . Severe recurrent major depression without psychotic features (HCC)   . ST segment depression 07/15/2015  . Catatonia (HCC) 07/08/2015  . Hypokalemia 05/29/2015  . S/P bilateral below knee amputation (HCC) 05/29/2015  . Severe recurrent major depression with psychotic features (HCC) 05/29/2015  . Noncompliance 05/29/2015  . Sinus bradycardia 09/07/2014  . Diabetic foot ulcer associated with diabetes mellitus due to underlying condition (HCC) 06/12/2014  . 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 Diabetic peripheral neuropathy (HCC) 02/14/2014  . Allergic rhinitis 02/14/2014  . GERD (gastroesophageal reflux disease) 02/14/2014  . Constipation 02/14/2014  . Paranoid schizophrenia, chronic condition (HCC) 12/10/2013  . S/P Chopart's amputation (HCC) 12/10/2013  . Foot osteomyelitis, left (HCC) 12/05/2013  . Phantom limb pain (HCC) 11/15/2013  . Depression 11/15/2013  . Generalized anxiety disorder 11/15/2013  . Diabetic foot  ulcer with osteomyelitis (HCC) 09/09/2013  . PTSD (post-traumatic stress disorder) 09/09/2013  . Addison's disease (HCC) 09/22/2011  . Hyponatremia 03/15/2011  . Headache(784.0) 03/15/2011  . Essential hypertension, benign 12/08/2010  . Diabetes mellitus with neuropathy (HCC) 12/08/2010  . Osteomyelitis of toe of left foot (HCC)     Orientation RESPIRATION BLADDER Height & Weight     Self, Time, Situation, Place  Normal Continent Weight: 190 lb (86.183 kg) Height:  5\' 10"  (177.8 cm)  BEHAVIORAL SYMPTOMS/MOOD NEUROLOGICAL BOWEL NUTRITION STATUS      Continent (Pt has problems at times with continence)    AMBULATORY STATUS COMMUNICATION OF NEEDS Skin   Supervision Verbally Normal                       Personal Care Assistance Level of Assistance  Dressing     Dressing Assistance: Limited assistance     Functional Limitations Info             SPECIAL CARE FACTORS FREQUENCY   (Wound care, [facility] will be calling [phone])                    Contractures Contractures Info: Not present    Additional Factors Info                  Current Medications (07/25/2015):  This is the current hospital active medication list Current Facility-Administered Medications  Medication Dose Route Frequency Provider Last Rate Last Dose  . acetaminophen (TYLENOL) tablet 650 mg  650 mg Oral Q6H PRN Audery AmelJohn T Clapacs, MD      . alum & mag hydroxide-simeth (MAALOX/MYLANTA) 200-200-20 MG/5ML suspension 30 mL  30 mL Oral  Q4H PRN Audery Amel, MD      . ARIPiprazole (ABILIFY) tablet 10 mg  10 mg Oral QHS Audery Amel, MD   10 mg at 07/24/15 2216  . atorvastatin (LIPITOR) tablet 40 mg  40 mg Oral q1800 Audery Amel, MD   40 mg at 07/24/15 1728  . clonazePAM (KLONOPIN) tablet 0.5 mg  0.5 mg Oral TID Audery Amel, MD   0.5 mg at 07/25/15 0925  . clopidogrel (PLAVIX) tablet 75 mg  75 mg Oral Daily Audery Amel, MD   75 mg at 07/25/15 0925  . cyanocobalamin  ((VITAMIN B-12)) injection 1,000 mcg  1,000 mcg Intramuscular Q30 days Audery Amel, MD      . fludrocortisone (FLORINEF) tablet 0.2 mg  0.2 mg Oral Daily Audery Amel, MD   0.2 mg at 07/25/15 0927  . FLUoxetine (PROZAC) capsule 60 mg  60 mg Oral Daily Audery Amel, MD   60 mg at 07/25/15 0926  . fluticasone (FLONASE) 50 MCG/ACT nasal spray 1 spray  1 spray Each Nare Daily Audery Amel, MD   1 spray at 07/25/15 0929  . hydrALAZINE (APRESOLINE) tablet 25 mg  25 mg Oral Q8H Audery Amel, MD   25 mg at 07/25/15 0656  . insulin detemir (LEVEMIR) injection 8 Units  8 Units Subcutaneous QHS Audery Amel, MD   8 Units at 07/24/15 2219  . isosorbide mononitrate (IMDUR) 24 hr tablet 30 mg  30 mg Oral Daily Audery Amel, MD   30 mg at 07/25/15 0925  . lisinopril (PRINIVIL,ZESTRIL) tablet 10 mg  10 mg Oral Daily Audery Amel, MD   10 mg at 07/25/15 0927  . loperamide (IMODIUM) capsule 2 mg  2 mg Oral PRN Audery Amel, MD   2 mg at 07/20/15 1424  . loratadine (CLARITIN) tablet 10 mg  10 mg Oral Daily Audery Amel, MD   10 mg at 07/25/15 0926  . magnesium hydroxide (MILK OF MAGNESIA) suspension 15 mL  15 mL Oral Daily PRN Audery Amel, MD      . magnesium hydroxide (MILK OF MAGNESIA) suspension 30 mL  30 mL Oral Daily PRN Audery Amel, MD      . magnesium oxide (MAG-OX) tablet 400 mg  400 mg Oral BID Audery Amel, MD   400 mg at 07/25/15 0926  . mirtazapine (REMERON) tablet 45 mg  45 mg Oral QHS Audery Amel, MD   45 mg at 07/24/15 2215  . nicotine (NICODERM CQ - dosed in mg/24 hours) patch 14 mg  14 mg Transdermal Daily Audery Amel, MD   14 mg at 07/25/15 0927  . pantoprazole (PROTONIX) EC tablet 40 mg  40 mg Oral Daily Audery Amel, MD   40 mg at 07/25/15 0926  . predniSONE (DELTASONE) tablet 10 mg  10 mg Oral Q breakfast Audery Amel, MD   10 mg at 07/25/15 0926  . pregabalin (LYRICA) capsule 75 mg  75 mg Oral TID Audery Amel, MD   75 mg at 07/25/15 0926  .  senna-docusate (Senokot-S) tablet 1 tablet  1 tablet Oral QHS PRN Audery Amel, MD      . TRIPLE ANTIBIOTIC [phone] OINT   Topical Daily Audery Amel, MD   1 application at 07/25/15 1610     Discharge Medications: Please see discharge summary for a list of discharge medications.  Relevant Imaging Results:  Relevant  Lab Results:   Additional Information Slight wound on right stump, but is healing well.   Candace L Hyatt, LCSW

## 2015-07-25 NOTE — Plan of Care (Signed)
Problem: Coping: Goal: Ability to verbalize frustrations and anger appropriately will improve Outcome: Progressing Patient has begun to become more alert and responsive  And able to verbalize anger or frustrations at this time [facility]
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
## 2015-07-25 NOTE — Progress Notes (Signed)
D: Patient is alert and oriented on the unit this shift. Patient attended  Group today. Patient denies suicidal ideation, homicidal ideation, auditory or visual hallucinations at the present time.  A: Scheduled medications are administered to patient as per MD orders. Emotional support and encouragement are provided. Patient is maintained on q.15 minute safety checks. Patient is informed to notify staff with questions or concerns. R: No adverse medication reactions are noted. Patient is cooperative with medication administration and treatment plan today. Patient is receptive, calm and cooperative on the unit at this time. Patient does not interact  with others on the unit this shift. Patient contracts for safety at this time. Patient remains safe at this time.

## 2015-07-25 NOTE — [facility] Group Notes (Signed)
[facility] Group Notes:  (Nursing/MHT/Case Management/Adjunct)  Date:  07/25/2015  Time:  4:56 AM  Type of Therapy:  Psychoeducational Skills  Participation Level:  Active  Participation Quality:  Appropriate  Affect:  Appropriate  Cognitive:  Appropriate  Insight:  Good  Engagement in Group:  Engaged  Modes of Intervention:  Discussion, Socialization and Support  Summary of Progress/Problems: Patient was very excited about going home, it seems his participation in group has enhanced during his time here.  Chancy MilroyLaquanda Y Laurissa Cowper 07/25/2015, 4:56 AM

## 2015-07-27 LAB — GLUCOSE, CAPILLARY
GLUCOSE-CAPILLARY: 172 mg/dL — AB (ref 65–99)
GLUCOSE-CAPILLARY: 134 mg/dL — AB (ref 65–99)
GLUCOSE-CAPILLARY: 162 mg/dL — AB (ref 65–99)
GLUCOSE-CAPILLARY: 153 mg/dL — AB (ref 65–99)
GLUCOSE-CAPILLARY: 219 mg/dL — AB (ref 65–99)
GLUCOSE-CAPILLARY: 301 mg/dL — AB (ref 65–99)
GLUCOSE-CAPILLARY: 193 mg/dL — AB (ref 65–99)
GLUCOSE-CAPILLARY: 253 mg/dL — AB (ref 65–99)
GLUCOSE-CAPILLARY: 245 mg/dL — AB (ref 65–99)
GLUCOSE-CAPILLARY: 196 mg/dL — AB (ref 65–99)
GLUCOSE-CAPILLARY: 200 mg/dL — AB (ref 65–99)
Glucose-Capillary: 209 mg/dL — ABNORMAL HIGH (ref 65–99)
Glucose-Capillary: 169 mg/dL — ABNORMAL HIGH (ref 65–99)
Glucose-Capillary: 179 mg/dL — ABNORMAL HIGH (ref 65–99)
Glucose-Capillary: 217 mg/dL — ABNORMAL HIGH (ref 65–99)
Glucose-Capillary: 170 mg/dL — ABNORMAL HIGH (ref 65–99)
Glucose-Capillary: 137 mg/dL — ABNORMAL HIGH (ref 65–99)
Glucose-Capillary: 251 mg/dL — ABNORMAL HIGH (ref 65–99)
Glucose-Capillary: 235 mg/dL — ABNORMAL HIGH (ref 65–99)

## 2015-08-12 ENCOUNTER — Telehealth: Payer: Self-pay

## 2015-08-12 NOTE — Telephone Encounter (Signed)
left message that she needs to speak with dr. Toni Amendclapacs about orders

## 2015-08-12 NOTE — Telephone Encounter (Signed)
Returned the call. Left a voicemail asking her to call back.

## 2015-09-15 ENCOUNTER — Inpatient Hospital Stay
Admission: EM | Admit: 2015-09-15 | Discharge: 2015-09-18 | DRG: 247 | Disposition: A | Discharge status: Home / Self Care | Payer: Medicaid Other | Attending: Internal Medicine | Admitting: Internal Medicine

## 2015-09-15 ENCOUNTER — Encounter: Payer: Self-pay | Admitting: Emergency Medicine

## 2015-09-15 ENCOUNTER — Emergency Department: Payer: Medicaid Other

## 2015-09-15 DIAGNOSIS — Z7902 Long term (current) use of antithrombotics/antiplatelets: Secondary | ICD-10-CM

## 2015-09-15 DIAGNOSIS — Z886 Allergy status to analgesic agent status: Secondary | ICD-10-CM

## 2015-09-15 DIAGNOSIS — Z7951 Long term (current) use of inhaled steroids: Secondary | ICD-10-CM

## 2015-09-15 DIAGNOSIS — I252 Old myocardial infarction: Secondary | ICD-10-CM

## 2015-09-15 DIAGNOSIS — G43909 Migraine, unspecified, not intractable, without status migrainosus: Secondary | ICD-10-CM | POA: Diagnosis present

## 2015-09-15 DIAGNOSIS — E876 Hypokalemia: Secondary | ICD-10-CM | POA: Diagnosis present

## 2015-09-15 DIAGNOSIS — F209 Schizophrenia, unspecified: Secondary | ICD-10-CM | POA: Diagnosis present

## 2015-09-15 DIAGNOSIS — Z6826 Body mass index (BMI) 26.0-26.9, adult: Secondary | ICD-10-CM

## 2015-09-15 DIAGNOSIS — F319 Bipolar disorder, unspecified: Secondary | ICD-10-CM | POA: Diagnosis present

## 2015-09-15 DIAGNOSIS — G8929 Other chronic pain: Secondary | ICD-10-CM | POA: Diagnosis present

## 2015-09-15 DIAGNOSIS — Z79899 Other long term (current) drug therapy: Secondary | ICD-10-CM

## 2015-09-15 DIAGNOSIS — H409 Unspecified glaucoma: Secondary | ICD-10-CM | POA: Diagnosis present

## 2015-09-15 DIAGNOSIS — R079 Chest pain, unspecified: Secondary | ICD-10-CM | POA: Diagnosis present

## 2015-09-15 DIAGNOSIS — I25110 Atherosclerotic heart disease of native coronary artery with unstable angina pectoris: Principal | ICD-10-CM | POA: Diagnosis present

## 2015-09-15 DIAGNOSIS — M549 Dorsalgia, unspecified: Secondary | ICD-10-CM | POA: Diagnosis present

## 2015-09-15 DIAGNOSIS — F411 Generalized anxiety disorder: Secondary | ICD-10-CM

## 2015-09-15 DIAGNOSIS — I10 Essential (primary) hypertension: Secondary | ICD-10-CM | POA: Diagnosis present

## 2015-09-15 DIAGNOSIS — E271 Primary adrenocortical insufficiency: Secondary | ICD-10-CM | POA: Diagnosis present

## 2015-09-15 DIAGNOSIS — F419 Anxiety disorder, unspecified: Secondary | ICD-10-CM | POA: Diagnosis present

## 2015-09-15 DIAGNOSIS — Z89511 Acquired absence of right leg below knee: Secondary | ICD-10-CM

## 2015-09-15 DIAGNOSIS — G4733 Obstructive sleep apnea (adult) (pediatric): Secondary | ICD-10-CM | POA: Diagnosis present

## 2015-09-15 DIAGNOSIS — F4310 Post-traumatic stress disorder, unspecified: Secondary | ICD-10-CM | POA: Diagnosis present

## 2015-09-15 DIAGNOSIS — J449 Chronic obstructive pulmonary disease, unspecified: Secondary | ICD-10-CM | POA: Diagnosis present

## 2015-09-15 DIAGNOSIS — M1990 Unspecified osteoarthritis, unspecified site: Secondary | ICD-10-CM | POA: Diagnosis present

## 2015-09-15 DIAGNOSIS — Z87820 Personal history of traumatic brain injury: Secondary | ICD-10-CM

## 2015-09-15 DIAGNOSIS — E1142 Type 2 diabetes mellitus with diabetic polyneuropathy: Secondary | ICD-10-CM | POA: Diagnosis present

## 2015-09-15 DIAGNOSIS — K219 Gastro-esophageal reflux disease without esophagitis: Secondary | ICD-10-CM | POA: Diagnosis present

## 2015-09-15 DIAGNOSIS — I248 Other forms of acute ischemic heart disease: Secondary | ICD-10-CM | POA: Diagnosis present

## 2015-09-15 DIAGNOSIS — Z794 Long term (current) use of insulin: Secondary | ICD-10-CM

## 2015-09-15 DIAGNOSIS — Z89512 Acquired absence of left leg below knee: Secondary | ICD-10-CM

## 2015-09-15 DIAGNOSIS — Z87891 Personal history of nicotine dependence: Secondary | ICD-10-CM

## 2015-09-15 DIAGNOSIS — E785 Hyperlipidemia, unspecified: Secondary | ICD-10-CM | POA: Diagnosis present

## 2015-09-15 DIAGNOSIS — F329 Major depressive disorder, single episode, unspecified: Secondary | ICD-10-CM | POA: Diagnosis present

## 2015-09-15 DIAGNOSIS — Z7952 Long term (current) use of systemic steroids: Secondary | ICD-10-CM

## 2015-09-15 LAB — BASIC METABOLIC PANEL
ANION GAP: 6 (ref 5–15)
BUN: 9 mg/dL (ref 6–20)
CALCIUM: 8.6 mg/dL — AB (ref 8.9–10.3)
CO2: 30 mmol/L (ref 22–32)
CREATININE: 0.71 mg/dL (ref 0.61–1.24)
Chloride: 99 mmol/L — ABNORMAL LOW (ref 101–111)
GFR calc Af Amer: >60 mL/min (ref >60)
GLUCOSE: 204 mg/dL — AB (ref 65–99)
Potassium: 3 mmol/L — ABNORMAL LOW (ref 3.5–5.1)
Sodium: 135 mmol/L (ref 135–145)

## 2015-09-15 LAB — HEMOGLOBIN A1C: Hgb A1c MFr Bld: 9 % — ABNORMAL HIGH (ref 4.0–6.0)

## 2015-09-15 LAB — TROPONIN I
Troponin I: 0.14 ng/mL (ref <0.03)
Troponin I: 0.39 ng/mL (ref <0.03)
Troponin I: 0.33 ng/mL (ref <0.03)
Troponin I: 0.29 ng/mL (ref <0.03)

## 2015-09-15 LAB — CBC
HCT: 37 % — ABNORMAL LOW (ref 40.0–52.0)
HEMOGLOBIN: 12.8 g/dL — AB (ref 13.0–18.0)
MCH: 28.6 pg (ref 26.0–34.0)
MCHC: 34.5 g/dL (ref 32.0–36.0)
MCV: 82.9 fL (ref 80.0–100.0)
PLATELETS: 151 10*3/uL (ref 150–440)
RBC: 4.46 MIL/uL (ref 4.40–5.90)
RDW: 17.2 % — AB (ref 11.5–14.5)
WBC: 8.8 10*3/uL (ref 3.8–10.6)

## 2015-09-15 LAB — GLUCOSE, CAPILLARY
Glucose-Capillary: 160 mg/dL — ABNORMAL HIGH (ref 65–99)
Glucose-Capillary: 112 mg/dL — ABNORMAL HIGH (ref 65–99)
Glucose-Capillary: 156 mg/dL — ABNORMAL HIGH (ref 65–99)
Glucose-Capillary: 192 mg/dL — ABNORMAL HIGH (ref 65–99)
Glucose-Capillary: 196 mg/dL — ABNORMAL HIGH (ref 65–99)

## 2015-09-15 LAB — TSH: TSH: 1.836 u[IU]/mL (ref 0.350–4.500)

## 2015-09-15 LAB — MRSA PCR SCREENING: MRSA by PCR: NEGATIVE

## 2015-09-15 MED ORDER — FLUOXETINE HCL 20 MG PO CAPS
60.0000 mg | ORAL_CAPSULE | Freq: Every day | ORAL | Status: DC
  Administered 2015-09-15 – 2015-09-18 (×4): 60 mg via ORAL
  Filled 2015-09-15 (×4): qty 3

## 2015-09-15 MED ORDER — OXYCODONE HCL 5 MG PO TABS: 5.0000 mg | ORAL_TABLET | ORAL | Status: DC | PRN

## 2015-09-15 MED ORDER — MORPHINE SULFATE (PF) 4 MG/ML IV SOLN: 4.0000 mg | INTRAVENOUS | Status: DC | PRN

## 2015-09-15 MED ORDER — GUAIFENESIN 100 MG/5ML PO SYRP
200.0000 mg | ORAL_SOLUTION | Freq: Three times a day (TID) | ORAL | Status: DC | PRN
Start: 2015-09-15 — End: 2015-09-18
  Filled 2015-09-15: qty 10

## 2015-09-15 MED ORDER — POTASSIUM CHLORIDE CRYS ER 20 MEQ PO TBCR
40.0000 meq | EXTENDED_RELEASE_TABLET | Freq: Once | ORAL | Status: AC
  Administered 2015-09-15: 40 meq via ORAL
  Filled 2015-09-15: qty 2

## 2015-09-15 MED ORDER — NICOTINE 14 MG/24HR TD PT24
14.0000 mg | MEDICATED_PATCH | Freq: Every day | TRANSDERMAL | Status: DC
  Administered 2015-09-15 – 2015-09-18 (×4): 14 mg via TRANSDERMAL
  Filled 2015-09-15 (×4): qty 1

## 2015-09-15 MED ORDER — SODIUM CHLORIDE 0.9% FLUSH
3.0000 mL | Freq: Two times a day (BID) | INTRAVENOUS | Status: DC
  Administered 2015-09-15 – 2015-09-18 (×5): 3 mL via INTRAVENOUS

## 2015-09-15 MED ORDER — INSULIN DETEMIR 100 UNIT/ML ~~LOC~~ SOLN
6.0000 [IU] | Freq: Every day | SUBCUTANEOUS | Status: DC
  Administered 2015-09-15 – 2015-09-17 (×3): 6 [IU] via SUBCUTANEOUS
  Filled 2015-09-15 (×4): qty 0.06

## 2015-09-15 MED ORDER — ASPIRIN EC 325 MG PO TBEC: 325.0000 mg | DELAYED_RELEASE_TABLET | Freq: Every day | ORAL | Status: DC

## 2015-09-15 MED ORDER — FLUDROCORTISONE ACETATE 0.1 MG PO TABS
0.2000 mg | ORAL_TABLET | Freq: Every day | ORAL | Status: DC
  Administered 2015-09-15 – 2015-09-18 (×4): 0.2 mg via ORAL
  Filled 2015-09-15 (×4): qty 2

## 2015-09-15 MED ORDER — FLUTICASONE PROPIONATE 50 MCG/ACT NA SUSP
1.0000 | Freq: Every day | NASAL | Status: DC
  Administered 2015-09-15 – 2015-09-18 (×3): 1 via NASAL
  Filled 2015-09-15 (×2): qty 16

## 2015-09-15 MED ORDER — MIRTAZAPINE 15 MG PO TABS
45.0000 mg | ORAL_TABLET | Freq: Every day | ORAL | Status: DC
  Administered 2015-09-15 – 2015-09-17 (×3): 45 mg via ORAL
  Filled 2015-09-15 (×3): qty 3

## 2015-09-15 MED ORDER — CLONAZEPAM 0.5 MG PO TABS
0.5000 mg | ORAL_TABLET | Freq: Three times a day (TID) | ORAL | Status: DC
  Administered 2015-09-15 – 2015-09-18 (×9): 0.5 mg via ORAL
  Filled 2015-09-15 (×9): qty 1

## 2015-09-15 MED ORDER — HYDRALAZINE HCL 25 MG PO TABS
25.0000 mg | ORAL_TABLET | Freq: Three times a day (TID) | ORAL | Status: DC
  Administered 2015-09-15 – 2015-09-18 (×9): 25 mg via ORAL
  Filled 2015-09-15 (×9): qty 1

## 2015-09-15 MED ORDER — LISINOPRIL 10 MG PO TABS
10.0000 mg | ORAL_TABLET | Freq: Every day | ORAL | Status: DC
  Administered 2015-09-15 – 2015-09-18 (×4): 10 mg via ORAL
  Filled 2015-09-15 (×4): qty 1

## 2015-09-15 MED ORDER — PREDNISONE 10 MG PO TABS
10.0000 mg | ORAL_TABLET | Freq: Every day | ORAL | Status: DC
  Administered 2015-09-15 – 2015-09-18 (×4): 10 mg via ORAL
  Filled 2015-09-15 (×4): qty 1

## 2015-09-15 MED ORDER — ACETAMINOPHEN 325 MG PO TABS
650.0000 mg | ORAL_TABLET | Freq: Four times a day (QID) | ORAL | Status: DC | PRN
  Administered 2015-09-15: 650 mg via ORAL
  Filled 2015-09-15: qty 2

## 2015-09-15 MED ORDER — ALUM & MAG HYDROXIDE-SIMETH 200-200-20 MG/5ML PO SUSP
30.0000 mL | Freq: Four times a day (QID) | ORAL | Status: DC | PRN
  Filled 2015-09-15: qty 30

## 2015-09-15 MED ORDER — ATORVASTATIN CALCIUM 20 MG PO TABS
40.0000 mg | ORAL_TABLET | Freq: Every day | ORAL | Status: DC
  Administered 2015-09-15 – 2015-09-17 (×3): 40 mg via ORAL
  Filled 2015-09-15 (×3): qty 2

## 2015-09-15 MED ORDER — INSULIN ASPART 100 UNIT/ML ~~LOC~~ SOLN
0.0000 [IU] | Freq: Three times a day (TID) | SUBCUTANEOUS | Status: DC
  Administered 2015-09-15 (×2): 3 [IU] via SUBCUTANEOUS
  Administered 2015-09-16: 2 [IU] via SUBCUTANEOUS
  Administered 2015-09-16: 5 [IU] via SUBCUTANEOUS
  Administered 2015-09-16: 2 [IU] via SUBCUTANEOUS
  Administered 2015-09-17 (×2): 3 [IU] via SUBCUTANEOUS
  Administered 2015-09-18: 2 [IU] via SUBCUTANEOUS
  Administered 2015-09-18: 3 [IU] via SUBCUTANEOUS
  Filled 2015-09-15: qty 2
  Filled 2015-09-15: qty 3
  Filled 2015-09-15: qty 2
  Filled 2015-09-15: qty 5
  Filled 2015-09-15 (×2): qty 3
  Filled 2015-09-15: qty 2
  Filled 2015-09-15 (×2): qty 3

## 2015-09-15 MED ORDER — ENOXAPARIN SODIUM 100 MG/ML ~~LOC~~ SOLN
1.0000 mg/kg | Freq: Two times a day (BID) | SUBCUTANEOUS | Status: DC
  Administered 2015-09-15 – 2015-09-18 (×7): 85 mg via SUBCUTANEOUS
  Filled 2015-09-15 (×7): qty 1

## 2015-09-15 MED ORDER — ONDANSETRON HCL 4 MG/2ML IJ SOLN: 4.0000 mg | Freq: Four times a day (QID) | INTRAMUSCULAR | Status: DC | PRN

## 2015-09-15 MED ORDER — BACITRACIN-NEOMYCIN-POLYMYXIN 400-5-5000 EX OINT
1.0000 "application " | TOPICAL_OINTMENT | CUTANEOUS | Status: DC | PRN
  Filled 2015-09-15: qty 1

## 2015-09-15 MED ORDER — ACETAMINOPHEN 650 MG RE SUPP: 650.0000 mg | Freq: Four times a day (QID) | RECTAL | Status: DC | PRN

## 2015-09-15 MED ORDER — DOCUSATE SODIUM 100 MG PO CAPS
100.0000 mg | ORAL_CAPSULE | Freq: Two times a day (BID) | ORAL | Status: DC
  Administered 2015-09-15 – 2015-09-18 (×6): 100 mg via ORAL
  Filled 2015-09-15 (×7): qty 1

## 2015-09-15 MED ORDER — ISOSORBIDE MONONITRATE ER 30 MG PO TB24
30.0000 mg | ORAL_TABLET | Freq: Every day | ORAL | Status: DC
  Administered 2015-09-15 – 2015-09-18 (×4): 30 mg via ORAL
  Filled 2015-09-15 (×4): qty 1

## 2015-09-15 MED ORDER — ONDANSETRON HCL 4 MG PO TABS: 4.0000 mg | ORAL_TABLET | Freq: Four times a day (QID) | ORAL | Status: DC | PRN

## 2015-09-15 MED ORDER — MAGNESIUM OXIDE 400 (241.3 MG) MG PO TABS
400.0000 mg | ORAL_TABLET | Freq: Two times a day (BID) | ORAL | Status: DC
  Administered 2015-09-15 – 2015-09-18 (×7): 400 mg via ORAL
  Filled 2015-09-15 (×7): qty 1

## 2015-09-15 MED ORDER — NITROGLYCERIN 0.4 MG SL SUBL
0.4000 mg | SUBLINGUAL_TABLET | SUBLINGUAL | Status: DC | PRN
  Administered 2015-09-15 – 2015-09-16 (×3): 0.4 mg via SUBLINGUAL
  Filled 2015-09-15 (×3): qty 1

## 2015-09-15 MED ORDER — CYANOCOBALAMIN 1000 MCG/ML IJ SOLN
1000.0000 ug | INTRAMUSCULAR | Status: DC
  Filled 2015-09-15: qty 1

## 2015-09-15 MED ORDER — MAGNESIUM HYDROXIDE 400 MG/5ML PO SUSP: 30.0000 mL | Freq: Every day | ORAL | Status: DC | PRN

## 2015-09-15 MED ORDER — LOPERAMIDE HCL 2 MG PO CAPS: 2.0000 mg | ORAL_CAPSULE | Freq: Four times a day (QID) | ORAL | Status: DC | PRN

## 2015-09-15 MED ORDER — SENNOSIDES-DOCUSATE SODIUM 8.6-50 MG PO TABS: 1.0000 | ORAL_TABLET | Freq: Every evening | ORAL | Status: DC | PRN

## 2015-09-15 MED ORDER — ENOXAPARIN SODIUM 40 MG/0.4ML ~~LOC~~ SOLN
40.0000 mg | SUBCUTANEOUS | Status: DC
  Administered 2015-09-15: 40 mg via SUBCUTANEOUS
  Filled 2015-09-15: qty 0.4

## 2015-09-15 MED ORDER — BISMUTH SUBSALICYLATE 262 MG/15ML PO SUSP
30.0000 mL | Freq: Four times a day (QID) | ORAL | Status: DC | PRN
  Filled 2015-09-15: qty 118

## 2015-09-15 MED ORDER — ARIPIPRAZOLE 10 MG PO TABS
10.0000 mg | ORAL_TABLET | Freq: Every day | ORAL | Status: DC
  Administered 2015-09-15 – 2015-09-17 (×3): 10 mg via ORAL
  Filled 2015-09-15 (×4): qty 1

## 2015-09-15 MED ORDER — PREGABALIN 75 MG PO CAPS
75.0000 mg | ORAL_CAPSULE | Freq: Three times a day (TID) | ORAL | Status: DC
  Administered 2015-09-15 – 2015-09-18 (×9): 75 mg via ORAL
  Filled 2015-09-15 (×9): qty 1

## 2015-09-15 MED ORDER — PANTOPRAZOLE SODIUM 40 MG PO TBEC
40.0000 mg | DELAYED_RELEASE_TABLET | Freq: Every day | ORAL | Status: DC
  Administered 2015-09-15 – 2015-09-18 (×4): 40 mg via ORAL
  Filled 2015-09-15 (×4): qty 1

## 2015-09-15 MED ORDER — CLOPIDOGREL BISULFATE 75 MG PO TABS
75.0000 mg | ORAL_TABLET | Freq: Every day | ORAL | Status: DC
  Administered 2015-09-15 – 2015-09-18 (×4): 75 mg via ORAL
  Filled 2015-09-15 (×4): qty 1
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
## 2015-09-15 NOTE — Progress Notes (Signed)
C/O chest pain 5/10. Nitro 0.4mg  given SLG, will continue to monitor
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
## 2015-09-15 NOTE — Progress Notes (Signed)
[employer] - [place] [facility]   PATIENT NAME: [name]    MR#:  [id number]  DATE OF BIRTH:  18-Nov-1955  SUBJECTIVE:  CHIEF COMPLAINT:   Chief Complaint  Patient presents with  . Chest Pain  Continue having chest pain 5 out of 10  REVIEW OF SYSTEMS:  Review of Systems  Constitutional: Negative for chills, fever and weight loss.  HENT: Negative for nosebleeds and sore throat.   Eyes: Negative for blurred vision.  Respiratory: Negative for cough, shortness of breath and wheezing.   Cardiovascular: Positive for chest pain. Negative for orthopnea, leg swelling and PND.  Gastrointestinal: Negative for abdominal pain, constipation, diarrhea, heartburn, nausea and vomiting.  Genitourinary: Negative for dysuria and urgency.  Musculoskeletal: Negative for back pain.  Skin: Negative for rash.  Neurological: Negative for dizziness, speech change, focal weakness and headaches.  Endo/Heme/Allergies: Does not bruise/bleed easily.  Psychiatric/Behavioral: Negative for depression.   DRUG ALLERGIES:   Allergies  Allergen Reactions  . Aspirin Nausea And Vomiting and Other (See Comments)    Only happens when pt takes high doses.     VITALS:  Blood pressure (!) 151/66, pulse (!) 58, temperature 98.6 F (37 C), temperature source Oral, resp. rate 16, weight 83.6 kg (184 lb 3.2 oz), SpO2 97 %. PHYSICAL EXAMINATION:  Physical Exam  Constitutional: He is oriented to person, place, and time and well-developed, well-nourished, and in no distress.  HENT:  Head: Normocephalic and atraumatic.  Eyes: Conjunctivae and EOM are normal. Pupils are equal, round, and reactive to light.  Neck: Normal range of motion. Neck supple. No tracheal deviation present. No thyromegaly present.  Cardiovascular: Normal rate, regular rhythm and normal heart sounds.   Pulmonary/Chest: Effort normal and breath sounds normal. No respiratory distress. He has no wheezes. He exhibits no  tenderness.  Abdominal: Soft. Bowel sounds are normal. He exhibits no distension. There is no tenderness.  Musculoskeletal: Normal range of motion.  BKA bilaterally  Neurological: He is alert and oriented to person, place, and time. No cranial nerve deficit.  Skin: Skin is warm and dry. No rash noted.  Psychiatric: Mood and affect normal.  Nursing note and vitals reviewed.  LABORATORY PANEL:   CBC  Recent Labs Lab 09/15/15 0024  WBC 8.8  HGB 12.8*  HCT 37.0*  PLT 151   ------------------------------------------------------------------------------------------------------------------ Chemistries   Recent Labs Lab 09/15/15 0024  NA 135  K 3.0*  CL 99*  CO2 30  GLUCOSE 204*  BUN 9  CREATININE 0.71  CALCIUM 8.6*   RADIOLOGY:  Dg Chest Port 1 View  Result Date: 09/15/2015 CLINICAL DATA:  Chest pain, and recent heart attack. History of diabetes, chronic bronchitis, COPD. EXAM: PORTABLE CHEST 1 VIEW COMPARISON:  Chest radiograph Jul 08, 2015 FINDINGS: Cardiomediastinal silhouette is normal. The lungs are clear without pleural effusions or focal consolidations. Trachea projects midline and there is no pneumothorax. Soft tissue planes and included osseous structures are non-suspicious. Partially imaged loose body LEFT shoulder. IMPRESSION: No active disease. Electronically Signed   By: Awilda Metro M.D.   On: 09/15/2015 00:52   ASSESSMENT AND PLAN:  This is a 60 year old male admitted for chest pain.  1. Non-STEMI  - Troponins trending up - Monitor on telemetry - Continue Lipitor, Imdur, lisinopril and plavix - Full dose Lovenox - May need cardiac catheterization versus stress test.  Depending on the troponins tomorrow  2. Coronary artery disease: Continue Plavix. Cardio input appreciated  3. Diabetes mellitus type 2:  Continue basal insulin reduced for hospital diet. continue sliding scale insulin.  4.  Hypokalemia - Replete and recheck - Check magnesium  5.  Hyperlipidemia: Continue statin therapy  6. Depression: Continue Abilify and fluoxetine as well as Klonopin and Remeron  7. Essential hypertension: Controlled; Continue hydralazine, lisinopril.  8. DVT prophylaxis: on Therapeutic anticoagulation   9. Continue gabapentin and Lyrica for neuropathy  10. GI prophylaxis: Pantoprazole per home regimen     All the records are reviewed and case discussed with Care Management/Social Worker. Management plans discussed with the patient, family and they are in agreement.  CODE STATUS: Full code  TOTAL TIME TAKING CARE OF THIS PATIENT: 35 minutes.   More than 50% of the time was spent in counseling/coordination of care: YES  POSSIBLE D/C IN 1-2 DAYS, DEPENDING ON CLINICAL CONDITION.  And cardiology evaluation   [facility], Carolle Ishii M.D on 09/15/2015 at 2:55 PM  Between 7am to 6pm - Pager - [phone]  After 6pm go to [email or web address] - Social research officer, government  [facility]  Office  [phone]  CC: Primary care physician; Presley Raddle, MD  Note: This dictation was prepared with Dragon dictation along with smaller phrase technology. Any transcriptional errors that result from this process are unintentional.
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
## 2015-09-15 NOTE — Clinical Social Work Note (Signed)
MSW met with patient to complete assessment.  Patient states he is from adult family home called  [facility] [address]  [place], [place] [address]  [phone]  Fax: [phone]  Patient stated his home coordinator is named Melida Quitter [phone] who is the main contact to speak with in regards to discharge planning.  Patient stated he has been at group home for just over a year and he has been pleased with the care that is being provided.  Patient was talkative and alert and oriented x4.  Patient requests that MSW add his home coordinator to the contact list.  Patient states he plans to return back to group home once he is medically ready for discharge and orders have been received.  CSW to continue to follow patient's progress.  Formal assessment to follow.  Jones Broom. Norval Morton, MSW [phone] 09/15/2015 4:28 PM

## 2015-09-15 NOTE — Progress Notes (Signed)
Patient complaining of chest pain. Given nitro x1. Relief given. Troponin reported to MD. Acknowledged. New orders given.
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
## 2015-09-15 NOTE — Care Management (Signed)
Patient lives at : [facility] [address]  [place], [name] [address]  [phone]  Fax: [phone] Spoke with Stanton Kidney at facility. They assist patient with his medications and bath. He is in a wheelchair. No home health no home O2.  CSW updated.

## 2015-09-16 ENCOUNTER — Observation Stay: Payer: Medicaid Other

## 2015-09-16 DIAGNOSIS — Z7951 Long term (current) use of inhaled steroids: Secondary | ICD-10-CM | POA: Diagnosis not present

## 2015-09-16 DIAGNOSIS — H409 Unspecified glaucoma: Secondary | ICD-10-CM | POA: Diagnosis present

## 2015-09-16 DIAGNOSIS — J449 Chronic obstructive pulmonary disease, unspecified: Secondary | ICD-10-CM | POA: Diagnosis present

## 2015-09-16 DIAGNOSIS — I252 Old myocardial infarction: Secondary | ICD-10-CM | POA: Diagnosis not present

## 2015-09-16 DIAGNOSIS — I248 Other forms of acute ischemic heart disease: Secondary | ICD-10-CM | POA: Diagnosis present

## 2015-09-16 DIAGNOSIS — G43909 Migraine, unspecified, not intractable, without status migrainosus: Secondary | ICD-10-CM | POA: Diagnosis present

## 2015-09-16 DIAGNOSIS — Z794 Long term (current) use of insulin: Secondary | ICD-10-CM | POA: Diagnosis not present

## 2015-09-16 DIAGNOSIS — Z89511 Acquired absence of right leg below knee: Secondary | ICD-10-CM | POA: Diagnosis not present

## 2015-09-16 DIAGNOSIS — K219 Gastro-esophageal reflux disease without esophagitis: Secondary | ICD-10-CM | POA: Diagnosis present

## 2015-09-16 DIAGNOSIS — F419 Anxiety disorder, unspecified: Secondary | ICD-10-CM | POA: Diagnosis present

## 2015-09-16 DIAGNOSIS — I25110 Atherosclerotic heart disease of native coronary artery with unstable angina pectoris: Secondary | ICD-10-CM | POA: Diagnosis present

## 2015-09-16 DIAGNOSIS — Z87820 Personal history of traumatic brain injury: Secondary | ICD-10-CM | POA: Diagnosis not present

## 2015-09-16 DIAGNOSIS — E1142 Type 2 diabetes mellitus with diabetic polyneuropathy: Secondary | ICD-10-CM | POA: Diagnosis present

## 2015-09-16 DIAGNOSIS — Z7902 Long term (current) use of antithrombotics/antiplatelets: Secondary | ICD-10-CM | POA: Diagnosis not present

## 2015-09-16 DIAGNOSIS — Z79899 Other long term (current) drug therapy: Secondary | ICD-10-CM | POA: Diagnosis not present

## 2015-09-16 DIAGNOSIS — F4310 Post-traumatic stress disorder, unspecified: Secondary | ICD-10-CM | POA: Diagnosis present

## 2015-09-16 DIAGNOSIS — I10 Essential (primary) hypertension: Secondary | ICD-10-CM | POA: Diagnosis present

## 2015-09-16 DIAGNOSIS — Z7952 Long term (current) use of systemic steroids: Secondary | ICD-10-CM | POA: Diagnosis not present

## 2015-09-16 DIAGNOSIS — E271 Primary adrenocortical insufficiency: Secondary | ICD-10-CM | POA: Diagnosis present

## 2015-09-16 DIAGNOSIS — F319 Bipolar disorder, unspecified: Secondary | ICD-10-CM | POA: Diagnosis present

## 2015-09-16 DIAGNOSIS — F209 Schizophrenia, unspecified: Secondary | ICD-10-CM | POA: Diagnosis present

## 2015-09-16 DIAGNOSIS — Z87891 Personal history of nicotine dependence: Secondary | ICD-10-CM | POA: Diagnosis not present

## 2015-09-16 DIAGNOSIS — M1990 Unspecified osteoarthritis, unspecified site: Secondary | ICD-10-CM | POA: Diagnosis present

## 2015-09-16 DIAGNOSIS — R079 Chest pain, unspecified: Secondary | ICD-10-CM | POA: Diagnosis present

## 2015-09-16 DIAGNOSIS — Z89512 Acquired absence of left leg below knee: Secondary | ICD-10-CM | POA: Diagnosis not present

## 2015-09-16 LAB — NM MYOCAR MULTI W/SPECT W/WALL MOTION / EF
CHL CUP NUCLEAR SRS: 3
CHL CUP NUCLEAR SSS: 16
CHL CUP RESTING HR STRESS: 49 {beats}/min
CSEPED: 1 min
CSEPEDS: 0 s
CSEPEW: 1 METS
CSEPHR: 41 %
CSEPPHR: 67 {beats}/min
LV dias vol: 159 mL (ref 62–150)
LVSYSVOL: 72 mL
MPHR: 163 {beats}/min
SDS: 13
TID: 1.17

## 2015-09-16 LAB — GLUCOSE, CAPILLARY
Glucose-Capillary: 137 mg/dL — ABNORMAL HIGH (ref 65–99)
Glucose-Capillary: 150 mg/dL — ABNORMAL HIGH (ref 65–99)
Glucose-Capillary: 212 mg/dL — ABNORMAL HIGH (ref 65–99)
Glucose-Capillary: 225 mg/dL — ABNORMAL HIGH (ref 65–99)

## 2015-09-16 LAB — MAGNESIUM: MAGNESIUM: 1.8 mg/dL (ref 1.7–2.4)

## 2015-09-16 LAB — BASIC METABOLIC PANEL
Anion gap: 6 (ref 5–15)
BUN: 9 mg/dL (ref 6–20)
CALCIUM: 8.9 mg/dL (ref 8.9–10.3)
CO2: 31 mmol/L (ref 22–32)
CREATININE: 0.75 mg/dL (ref 0.61–1.24)
Chloride: 105 mmol/L (ref 101–111)
GFR calc Af Amer: >60 mL/min (ref >60)
GLUCOSE: 133 mg/dL — AB (ref 65–99)
POTASSIUM: 3.7 mmol/L (ref 3.5–5.1)
SODIUM: 142 mmol/L (ref 135–145)

## 2015-09-16 LAB — CBC
HCT: 37.2 % — ABNORMAL LOW (ref 40.0–52.0)
Hemoglobin: 12.5 g/dL — ABNORMAL LOW (ref 13.0–18.0)
MCH: 28.4 pg (ref 26.0–34.0)
MCHC: 33.7 g/dL (ref 32.0–36.0)
MCV: 84.3 fL (ref 80.0–100.0)
PLATELETS: 133 10*3/uL — AB (ref 150–440)
RBC: 4.42 MIL/uL (ref 4.40–5.90)
RDW: 17.1 % — AB (ref 11.5–14.5)
WBC: 7.5 10*3/uL (ref 3.8–10.6)

## 2015-09-16 LAB — TROPONIN I: Troponin I: 0.24 ng/mL (ref <0.03)

## 2015-09-16 MED ORDER — TECHNETIUM TC 99M TETROFOSMIN IV KIT
30.0000 | PACK | Freq: Once | INTRAVENOUS | Status: AC | PRN
  Administered 2015-09-16: 30.42 via INTRAVENOUS

## 2015-09-16 MED ORDER — REGADENOSON 0.4 MG/5ML IV SOLN
0.4000 mg | Freq: Once | INTRAVENOUS | Status: AC
  Administered 2015-09-16: 0.4 mg via INTRAVENOUS

## 2015-09-16 MED ORDER — SODIUM CHLORIDE 0.9% FLUSH: 3.0000 mL | INTRAVENOUS | Status: DC | PRN

## 2015-09-16 MED ORDER — SODIUM CHLORIDE 0.9 % IV SOLN: 250.0000 mL | INTRAVENOUS | Status: DC | PRN

## 2015-09-16 MED ORDER — SODIUM CHLORIDE 0.9% FLUSH
3.0000 mL | Freq: Two times a day (BID) | INTRAVENOUS | Status: DC
  Administered 2015-09-16 – 2015-09-17 (×3): 3 mL via INTRAVENOUS

## 2015-09-16 MED ORDER — SODIUM CHLORIDE 0.9 % WEIGHT BASED INFUSION
3.0000 mL/kg/h | INTRAVENOUS | Status: AC
  Administered 2015-09-17: 12:00:00 via INTRAVENOUS

## 2015-09-16 MED ORDER — SODIUM CHLORIDE 0.9 % WEIGHT BASED INFUSION: 1.0000 mL/kg/h | INTRAVENOUS | Status: DC

## 2015-09-16 MED ORDER — ASPIRIN 81 MG PO CHEW: 81.0000 mg | CHEWABLE_TABLET | ORAL | Status: DC

## 2015-09-16 MED ORDER — TECHNETIUM TC 99M TETROFOSMIN IV KIT
13.0000 | PACK | Freq: Once | INTRAVENOUS | Status: AC | PRN
  Administered 2015-09-16: 13.35 via INTRAVENOUS

## 2015-09-16 MED ORDER — ENSURE ENLIVE PO LIQD
237.0000 mL | Freq: Two times a day (BID) | ORAL | Status: DC
  Administered 2015-09-16: 237 mL via ORAL
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
## 2015-09-16 NOTE — Progress Notes (Signed)
Patient returned to unit, stress test completed

## 2015-09-16 NOTE — Progress Notes (Signed)
Patient NPO after MN for cardiac catherization tomorrow, consent sign in the chart

## 2015-09-16 NOTE — Progress Notes (Signed)
[facility]  Patient Status: Active  [facility] is providing the following services: SN  If patient discharges after hours, please call [phone].   Dimple Casey 09/16/2015, 9:24 AM

## 2015-09-16 NOTE — Progress Notes (Signed)
Patient off the unit to nuclear medicine for stress test

## 2015-09-16 NOTE — Clinical Social Work Note (Signed)
Clinical Social Work Assessment  Patient Details  Name: [name] MRN: [id number] Date of Birth: 03/12/1955  Date of referral:  09/15/15               Reason for consult:  Facility Placement                Permission sought to share information with:  Facility Medical sales representative Permission granted to share information::  Yes, Verbal Permission Granted  Name::     [name] [phone] caregiver at group home  Agency::  [facility]  Relationship::     Contact Information:     Housing/Transportation Living arrangements for the past 2 months:  Group Home Source of Information:  Patient Patient Interpreter Needed:  None Criminal Activity/Legal Involvement Pertinent to Current Situation/Hospitalization:  No - Comment as needed Significant Relationships:  Other(Comment), Siblings (Patient is in a group home and has caregivers) Lives with:  Other (Comment) (Resident of group home) Do you feel safe going back to the place where you live?  Yes Need for family participation in patient care:  No (Coment)  Care giving concerns:  Patient does not have any concerns about the care being provided at his group home.   Social Worker assessment / plan:  Patient is a 60 year old male who lives in a group home.  Patient is alert and oriented x4 and able to express his concerns.  Patient stated that he has been living at the group home for just over a year and does not have any issues with it.  Patient states they assist him with his meds, making meals, and assistance in bathing.  Patient states that he has a care coordinator named [name] [phone] who assists with his appointments and managing his care at group home.  Patient stated that he plans to return back to group home and will need EMS transport to get there.  Patient did not express any other concerns or issues with care that has been provided.   Employment status:  Disabled (Comment on whether or not currently  receiving Disability) Insurance information:  Medicaid [place] PT Recommendations:    Information / Referral to community resources:     Patient/Family's Response to care:  Patient is in agreement to returning back to group home.  Patient/Family's Understanding of and Emotional Response to Diagnosis, Current Treatment, and Prognosis:  Patient is aware of current treatment plan and prognosis, patient expressed that he is concerned about the heart attacks that he has had, and he is hoping the physician can figure out what is causing the problem.  Emotional Assessment Appearance:  Appears stated age Attitude/Demeanor/Rapport:    Affect (typically observed):  Appropriate, Calm, Pleasant Orientation:  Oriented to Self, Oriented to Place, Oriented to  Time, Oriented to Situation Alcohol / Substance use:  Not Applicable Psych involvement (Current and /or in the community):  Outpatient Provider  Discharge Needs  Concerns to be addressed:  No discharge needs identified Readmission within the last 30 days:  No Current discharge risk:  None Barriers to Discharge:  No Barriers Identified   Darleene Cleaver, LCSWA 09/16/2015, 9:58 AM

## 2015-09-16 NOTE — Progress Notes (Signed)
[employer] [facility] [facility] [place] at [facility]   PATIENT NAME: [name]    MR#:  [id number]  DATE OF BIRTH:  30-Dec-1955  SUBJECTIVE:  CHIEF COMPLAINT:   Chief Complaint  Patient presents with  . Chest Pain  Just returned from stress test, getting cardiac catheterization tomorrow as per patient REVIEW OF SYSTEMS:  Review of Systems  Constitutional: Negative for chills, fever and weight loss.  HENT: Negative for nosebleeds and sore throat.   Eyes: Negative for blurred vision.  Respiratory: Negative for cough, shortness of breath and wheezing.   Cardiovascular: Positive for chest pain. Negative for orthopnea, leg swelling and PND.  Gastrointestinal: Negative for abdominal pain, constipation, diarrhea, heartburn, nausea and vomiting.  Genitourinary: Negative for dysuria and urgency.  Musculoskeletal: Negative for back pain.  Skin: Negative for rash.  Neurological: Negative for dizziness, speech change, focal weakness and headaches.  Endo/Heme/Allergies: Does not bruise/bleed easily.  Psychiatric/Behavioral: Negative for depression.   DRUG ALLERGIES:   Allergies  Allergen Reactions  . Aspirin Nausea And Vomiting and Other (See Comments)    Only happens when pt takes high doses.     VITALS:  Blood pressure (!) 156/69, pulse (!) 55, temperature 98.5 F (36.9 C), temperature source Oral, resp. rate 18, weight 82.7 kg (182 lb 4.8 oz), SpO2 97 %. PHYSICAL EXAMINATION:  Physical Exam  Constitutional: He is oriented to person, place, and time and well-developed, well-nourished, and in no distress.  HENT:  Head: Normocephalic and atraumatic.  Eyes: Conjunctivae and EOM are normal. Pupils are equal, round, and reactive to light.  Neck: Normal range of motion. Neck supple. No tracheal deviation present. No thyromegaly present.  Cardiovascular: Normal rate, regular rhythm and normal heart sounds.   Pulmonary/Chest: Effort normal and breath sounds normal. No respiratory  distress. He has no wheezes. He exhibits no tenderness.  Abdominal: Soft. Bowel sounds are normal. He exhibits no distension. There is no tenderness.  Musculoskeletal: Normal range of motion.  BKA bilaterally  Neurological: He is alert and oriented to person, place, and time. No cranial nerve deficit.  Skin: Skin is warm and dry. No rash noted.  Psychiatric: Mood and affect normal.  Nursing note and vitals reviewed.  LABORATORY PANEL:   CBC  Recent Labs Lab 09/16/15 0427  WBC 7.5  HGB 12.5*  HCT 37.2*  PLT 133*   ------------------------------------------------------------------------------------------------------------------ Chemistries   Recent Labs Lab 09/16/15 0427  NA 142  K 3.7  CL 105  CO2 31  GLUCOSE 133*  BUN 9  CREATININE 0.75  CALCIUM 8.9  MG 1.8   RADIOLOGY:  Nm Myocar Multi W/spect W/wall Motion / Ef  Result Date: 09/16/2015  Blood pressure demonstrated a normal response to exercise.  There was no ST segment deviation noted during stress.  Defect 1: There is a medium defect of moderate severity present in the apical anterior and apical septal location.  Findings consistent with ischemia.  This is an intermediate risk study.  The left ventricular ejection fraction is moderately decreased (30-44%).    ASSESSMENT AND PLAN:  This is a 60 year old male admitted for chest pain.  1. Non-STEMI - Ruled out   - Monitor on telemetry - Continue Lipitor, Imdur, lisinopril and plavix - Full dose Lovenox - Stress test came back positive showing reversible ischemia, planned for cardiac catheterization tomorrow  2. Coronary artery disease: Continue Plavix. Cardio input appreciated  3. Diabetes mellitus type 2: Continue basal insulin reduced for hospital diet. continue sliding scale insulin.  4.  Hypokalemia - Replete and resolved  5. Hyperlipidemia: Continue statin therapy  6. Depression: Continue Abilify and fluoxetine as well as Klonopin and  Remeron  7. Essential hypertension: Controlled; Continue hydralazine, lisinopril.  8. DVT prophylaxis: on Therapeutic anticoagulation   9. Continue gabapentin and Lyrica for neuropathy  10. GI prophylaxis: Pantoprazole per home regimen     All the records are reviewed and case discussed with Care Management/Social Worker. Management plans discussed with the patient, nursing and they are in agreement.  CODE STATUS: Full code  TOTAL TIME TAKING CARE OF THIS PATIENT: 35 minutes.   More than 50% of the time was spent in counseling/coordination of care: YES  POSSIBLE D/C IN 1-2 DAYS, DEPENDING ON CLINICAL CONDITION.  And cardiology evaluation - getting cardiac catheterization tomorrow   [facility], Lovie Zarling M.D on 09/16/2015 at 2:58 PM  Between 7am to 6pm - Pager - [phone]  After 6pm go to [email or web address] - Social research officer, government  [facility] [place] [facility]  Office  [phone]  CC: Primary care physician; Presley Raddle, MD  Note: This dictation was prepared with Dragon dictation along with smaller phrase technology. Any transcriptional errors that result from this process are unintentional.

## 2015-09-16 NOTE — Care Management (Signed)
Patient active with [facility] for [facility]

## 2015-09-17 ENCOUNTER — Encounter
Admission: EM | Disposition: A | Discharge status: Home / Self Care | Payer: Self-pay | Source: Home / Self Care | Attending: Internal Medicine

## 2015-09-17 HISTORY — PX: CARDIAC CATHETERIZATION: SHX172

## 2015-09-17 LAB — GLUCOSE, CAPILLARY
Glucose-Capillary: 169 mg/dL — ABNORMAL HIGH (ref 65–99)
Glucose-Capillary: 179 mg/dL — ABNORMAL HIGH (ref 65–99)
Glucose-Capillary: 194 mg/dL — ABNORMAL HIGH (ref 65–99)

## 2015-09-17 LAB — PROTIME-INR
INR: 1.11
PROTHROMBIN TIME: 14.3 s (ref 11.4–15.2)

## 2015-09-17 SURGERY — LEFT HEART CATH AND CORONARY ANGIOGRAPHY
Anesthesia: Moderate Sedation

## 2015-09-17 MED ORDER — NITROGLYCERIN 1 MG/10 ML FOR IR/CATH LAB
INTRA_ARTERIAL | Status: DC | PRN
  Administered 2015-09-17: 200 ug via INTRACORONARY

## 2015-09-17 MED ORDER — FENTANYL CITRATE (PF) 100 MCG/2ML IJ SOLN
INTRAMUSCULAR | Status: AC
  Filled 2015-09-17: qty 2

## 2015-09-17 MED ORDER — HEPARIN (PORCINE) IN NACL 2-0.9 UNIT/ML-% IJ SOLN
INTRAMUSCULAR | Status: AC
  Filled 2015-09-17: qty 500

## 2015-09-17 MED ORDER — BIVALIRUDIN 250 MG IV SOLR
INTRAVENOUS | Status: AC
  Filled 2015-09-17: qty 250

## 2015-09-17 MED ORDER — CLOPIDOGREL BISULFATE 75 MG PO TABS
ORAL_TABLET | ORAL | Status: AC
  Filled 2015-09-17: qty 3

## 2015-09-17 MED ORDER — ASPIRIN 81 MG PO CHEW
CHEWABLE_TABLET | ORAL | Status: DC | PRN
  Administered 2015-09-17: 81 mg via ORAL

## 2015-09-17 MED ORDER — MIDAZOLAM HCL 2 MG/2ML IJ SOLN
INTRAMUSCULAR | Status: AC
  Filled 2015-09-17: qty 2

## 2015-09-17 MED ORDER — HEPARIN SODIUM (PORCINE) 1000 UNIT/ML IJ SOLN
INTRAMUSCULAR | Status: DC | PRN
  Administered 2015-09-17: 4000 [IU] via INTRAVENOUS

## 2015-09-17 MED ORDER — MIDAZOLAM HCL 2 MG/2ML IJ SOLN
INTRAMUSCULAR | Status: DC | PRN
  Administered 2015-09-17: 1 mg via INTRAVENOUS

## 2015-09-17 MED ORDER — VERAPAMIL HCL 2.5 MG/ML IV SOLN
INTRAVENOUS | Status: AC
  Filled 2015-09-17: qty 2

## 2015-09-17 MED ORDER — SODIUM CHLORIDE 0.9 % IV SOLN
INTRAVENOUS | Status: DC | PRN
  Administered 2015-09-17: 1.75 mg/kg/h via INTRAVENOUS

## 2015-09-17 MED ORDER — IOPAMIDOL (ISOVUE-300) INJECTION 61%
INTRAVENOUS | Status: DC | PRN
  Administered 2015-09-17: 220 mL via INTRA_ARTERIAL

## 2015-09-17 MED ORDER — BIVALIRUDIN BOLUS VIA INFUSION - CUPID
INTRAVENOUS | Status: DC | PRN
  Administered 2015-09-17: 61.2 mg via INTRAVENOUS

## 2015-09-17 MED ORDER — FENTANYL CITRATE (PF) 100 MCG/2ML IJ SOLN
INTRAMUSCULAR | Status: DC | PRN
  Administered 2015-09-17: 25 ug via INTRAVENOUS

## 2015-09-17 MED ORDER — HEPARIN SODIUM (PORCINE) 1000 UNIT/ML IJ SOLN
INTRAMUSCULAR | Status: AC
  Filled 2015-09-17: qty 1

## 2015-09-17 MED ORDER — HYDRALAZINE HCL 20 MG/ML IJ SOLN
10.0000 mg | Freq: Four times a day (QID) | INTRAMUSCULAR | Status: DC | PRN
  Administered 2015-09-17 – 2015-09-18 (×2): 10 mg via INTRAVENOUS
  Filled 2015-09-17 (×2): qty 1

## 2015-09-17 MED ORDER — VERAPAMIL HCL 2.5 MG/ML IV SOLN
INTRAVENOUS | Status: DC | PRN
  Administered 2015-09-17: 2.5 mg via INTRA_ARTERIAL

## 2015-09-17 MED ORDER — NITROGLYCERIN 5 MG/ML IV SOLN
INTRAVENOUS | Status: AC
  Filled 2015-09-17: qty 10

## 2015-09-17 MED ORDER — ASPIRIN 81 MG PO CHEW
CHEWABLE_TABLET | ORAL | Status: AC
  Filled 2015-09-17: qty 1

## 2015-09-17 MED ORDER — CLOPIDOGREL BISULFATE 75 MG PO TABS
ORAL_TABLET | ORAL | Status: DC | PRN
  Administered 2015-09-17: 225 mg via ORAL

## 2015-09-17 SURGICAL SUPPLY — 14 items
BALLN TREK RX 2.5X15 (BALLOONS) ×3
BALLOON TREK RX 2.5X15 (BALLOONS) IMPLANT
CATH 5F 110X4 TIG (CATHETERS) ×2 IMPLANT
CATH HEARTRAIL 6F IL3.5 (CATHETERS) ×2 IMPLANT
CATH INFINITI 5FR ANG PIGTAIL (CATHETERS) ×2 IMPLANT
CATH INFINITI JR4 5F (CATHETERS) ×2 IMPLANT
DEVICE INFLAT 30 PLUS (MISCELLANEOUS) ×2 IMPLANT
DEVICE RAD TR BAND REGULAR (VASCULAR PRODUCTS) ×2 IMPLANT
GLIDESHEATH SLEND A-KIT 6F 22G (SHEATH) ×2 IMPLANT
KIT MANI 3VAL PERCEP (MISCELLANEOUS) ×3 IMPLANT
PACK CARDIAC CATH (CUSTOM PROCEDURE TRAY) ×3 IMPLANT
STENT XIENCE ALPINE RX 3.25X18 (Permanent Stent) ×2 IMPLANT
WIRE ASAHI PROWATER 180CM (WIRE) ×2 IMPLANT
WIRE SAFE-T 1.5MM-J .035X260CM (WIRE) ×4 IMPLANT

## 2015-09-17 NOTE — [place] FL2 (Signed)
[place] MEDICAID FL2 LEVEL OF CARE SCREENING TOOL     IDENTIFICATION  Patient Name: [name] Birthdate: July 21, 1955 Sex: male Admission Date (Current Location): 09/15/2015  [place] and [place] Number:  [address] [id number] [facility] Facility and Address:  [facility], [address] [place], [name] [address]      Provider Number: [id number]  Attending Physician Name and Address:  Delfino Lovett, MD  Relative Name and Phone Number:  [name] [phone]    Current Level of Care: Hospital Recommended Level of Care: [facility] Prior Approval Number:    Date Approved/Denied:   PASRR Number: [id number]  Discharge Plan: Other (Comment) [facility])    Current Diagnoses: Patient Active Problem List   Diagnosis Date Noted  . Chest pain 09/15/2015  . NSTEMI (non-ST elevated myocardial infarction) (HCC) 07/16/2015  . Severe recurrent major depression without psychotic features (HCC)   . ST segment depression 07/15/2015  . Catatonia (HCC) 07/08/2015  . Hypokalemia 05/29/2015  . S/P bilateral below knee amputation (HCC) 05/29/2015  . Severe recurrent major depression with psychotic features (HCC) 05/29/2015  . Noncompliance 05/29/2015  . Sinus bradycardia 09/07/2014  . Diabetic foot ulcer associated with diabetes mellitus due to underlying condition (HCC) 06/12/2014  . 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 Diabetic peripheral neuropathy (HCC) 02/14/2014  . Allergic rhinitis 02/14/2014  . GERD (gastroesophageal reflux disease) 02/14/2014  . Constipation 02/14/2014  . Paranoid schizophrenia, chronic condition (HCC) 12/10/2013  . S/P Chopart's amputation (HCC) 12/10/2013  . Foot osteomyelitis, left (HCC) 12/05/2013  . Phantom limb pain (HCC) 11/15/2013  . Depression 11/15/2013  . Generalized anxiety disorder  11/15/2013  . Diabetic foot ulcer with osteomyelitis (HCC) 09/09/2013  . PTSD (post-traumatic stress disorder) 09/09/2013  . Addison's disease (HCC) 09/22/2011  . Hyponatremia 03/15/2011  . Headache(784.0) 03/15/2011  . Essential hypertension, benign 12/08/2010  . Diabetes mellitus with neuropathy (HCC) 12/08/2010  . Osteomyelitis of toe of left foot (HCC)     Orientation RESPIRATION BLADDER Height & Weight     Self, Time, Situation, Place  Normal Continent Weight: 179 lb 12.8 oz (81.6 kg) Height:     BEHAVIORAL SYMPTOMS/MOOD NEUROLOGICAL BOWEL NUTRITION STATUS      Continent Diet (Carb Modified)  AMBULATORY STATUS COMMUNICATION OF NEEDS Skin   Limited Assist Verbally Normal                       Personal Care Assistance Level of Assistance  Dressing, Bathing Bathing Assistance: Limited assistance   Dressing Assistance: Limited assistance     Functional Limitations Info  Sight, Hearing, Speech Sight Info: Adequate Hearing Info: Adequate Speech Info: Adequate    SPECIAL CARE FACTORS FREQUENCY                       Contractures Contractures Info: Not present    Additional Factors Info  Code Status, Allergies, Psychotropic, Insulin Sliding Scale Code Status Info: Full Code Allergies Info: ASPIRIN Psychotropic Info: clonazePAM (KLONOPIN) tablet 0.5 mg, ARIPiprazole (ABILIFY) tablet 10 mg, FLUoxetine (PROZAC) capsule 60 mg, mirtazapine (REMERON) tablet 45 mg,  Insulin Sliding Scale Info: 3x a day       Current Medications (09/17/2015):  This is the current hospital active medication list Current Facility-Administered Medications  Medication Dose Route Frequency Provider Last Rate Last Dose  . 0.9 %  sodium  chloride infusion  250 mL Intravenous PRN Marcina Millard, MD      . 0.9% sodium chloride infusion  1 mL/kg/hr Intravenous Continuous Marcina Millard, MD      . acetaminophen (TYLENOL) tablet 650 mg  650 mg Oral Q6H PRN Arnaldo Natal, MD   650  mg at 09/15/15 1610   Or  . acetaminophen (TYLENOL) suppository 650 mg  650 mg Rectal Q6H PRN Arnaldo Natal, MD      . alum & mag hydroxide-simeth (MAALOX/MYLANTA) 200-200-20 MG/5ML suspension 30 mL  30 mL Oral Q6H PRN Arnaldo Natal, MD      . ARIPiprazole (ABILIFY) tablet 10 mg  10 mg Oral QHS Arnaldo Natal, MD   10 mg at 09/16/15 2149  . aspirin chewable tablet 81 mg  81 mg Oral Pre-Cath Alexander Darrold Junker, MD      . atorvastatin (LIPITOR) tablet 40 mg  40 mg Oral q1800 Arnaldo Natal, MD   40 mg at 09/16/15 1742  . bismuth subsalicylate (PEPTO BISMOL) 262 MG/15ML suspension 30 mL  30 mL Oral Q6H PRN Arnaldo Natal, MD      . clonazePAM Scarlette Calico) tablet 0.5 mg  0.5 mg Oral TID Arnaldo Natal, MD   0.5 mg at 09/16/15 2149  . clopidogrel (PLAVIX) tablet 75 mg  75 mg Oral Daily Arnaldo Natal, MD   75 mg at 09/17/15 0556  . cyanocobalamin ((VITAMIN B-12)) injection 1,000 mcg  1,000 mcg Intramuscular Q30 days Arnaldo Natal, MD      . docusate sodium (COLACE) capsule 100 mg  100 mg Oral BID Arnaldo Natal, MD   100 mg at 09/16/15 2149  . enoxaparin (LOVENOX) injection 85 mg  1 mg/kg Subcutaneous Q12H Delfino Lovett, MD   85 mg at 09/16/15 2150  . feeding supplement (ENSURE ENLIVE) (ENSURE ENLIVE) liquid 237 mL  237 mL Oral BID BM Vipul Shah, MD   237 mL at 09/16/15 1742  . fludrocortisone (FLORINEF) tablet 0.2 mg  0.2 mg Oral Daily Arnaldo Natal, MD   0.2 mg at 09/16/15 1256  . FLUoxetine (PROZAC) capsule 60 mg  60 mg Oral Daily Arnaldo Natal, MD   60 mg at 09/16/15 1255  . fluticasone (FLONASE) 50 MCG/ACT nasal spray 1 spray  1 spray Each Nare Daily Arnaldo Natal, MD   1 spray at 09/15/15 1212  . guaifenesin (ROBITUSSIN) 100 MG/5ML syrup 200 mg  200 mg Oral TID PRN Arnaldo Natal, MD      . hydrALAZINE (APRESOLINE) injection 10 mg  10 mg Intravenous Q6H PRN Delfino Lovett, MD   10 mg at 09/17/15 0825  . hydrALAZINE (APRESOLINE) tablet 25 mg  25 mg Oral Q8H  Arnaldo Natal, MD   25 mg at 09/17/15 9604  . insulin aspart (novoLOG) injection 0-15 Units  0-15 Units Subcutaneous TID WC Arnaldo Natal, MD   3 Units at 09/17/15 0825  . insulin detemir (LEVEMIR) injection 6 Units  6 Units Subcutaneous QHS Arnaldo Natal, MD   6 Units at 09/16/15 2149  . isosorbide mononitrate (IMDUR) 24 hr tablet 30 mg  30 mg Oral Daily Arnaldo Natal, MD   30 mg at 09/16/15 1255  . lisinopril (PRINIVIL,ZESTRIL) tablet 10 mg  10 mg Oral Daily Arnaldo Natal, MD   10 mg at 09/16/15 1256  . loperamide (IMODIUM) capsule 2 mg  2 mg Oral Q6H PRN Arnaldo Natal, MD      .  magnesium hydroxide (MILK OF MAGNESIA) suspension 30 mL  30 mL Oral Daily PRN Arnaldo Natal, MD      . magnesium oxide (MAG-OX) tablet 400 mg  400 mg Oral BID Arnaldo Natal, MD   400 mg at 09/16/15 2149  . mirtazapine (REMERON) tablet 45 mg  45 mg Oral QHS Arnaldo Natal, MD   45 mg at 09/16/15 2149  . morphine 4 MG/ML injection 4 mg  4 mg Intravenous Q4H PRN Arnaldo Natal, MD      . neomycin-bacitracin-polymyxin (NEOSPORIN) ointment 1 application  1 application Topical PRN Arnaldo Natal, MD      . nicotine (NICODERM CQ - dosed in mg/24 hours) patch 14 mg  14 mg Transdermal Daily Arnaldo Natal, MD   14 mg at 09/16/15 1258  . nitroGLYCERIN (NITROSTAT) SL tablet 0.4 mg  0.4 mg Sublingual Q5 min PRN Arnaldo Natal, MD   0.4 mg at 09/16/15 2035  . ondansetron (ZOFRAN) tablet 4 mg  4 mg Oral Q6H PRN Arnaldo Natal, MD       Or  . ondansetron [facility]) injection 4 mg  4 mg Intravenous Q6H PRN Arnaldo Natal, MD      . oxyCODONE (Oxy IR/ROXICODONE) immediate release tablet 5 mg  5 mg Oral Q4H PRN Arnaldo Natal, MD      . pantoprazole (PROTONIX) EC tablet 40 mg  40 mg Oral QAC breakfast Arnaldo Natal, MD   40 mg at 09/17/15 0825  . predniSONE (DELTASONE) tablet 10 mg  10 mg Oral Q breakfast Arnaldo Natal, MD   10 mg at 09/17/15 0825  . pregabalin (LYRICA)  capsule 75 mg  75 mg Oral TID Arnaldo Natal, MD   75 mg at 09/17/15 0825  . senna-docusate (Senokot-S) tablet 1 tablet  1 tablet Oral QHS PRN Arnaldo Natal, MD      . sodium chloride flush (NS) 0.9 % injection 3 mL  3 mL Intravenous Q12H Arnaldo Natal, MD   3 mL at 09/16/15 1258  . sodium chloride flush (NS) 0.9 % injection 3 mL  3 mL Intravenous Q12H Marcina Millard, MD   3 mL at 09/17/15 1610  . sodium chloride flush (NS) 0.9 % injection 3 mL  3 mL Intravenous PRN Marcina Millard, MD         Discharge Medications: Please see discharge summary for a list of discharge medications.  Relevant Imaging Results:  Relevant Lab Results:   Additional Information SSN [id number]  Darleene Cleaver, [place]

## 2015-09-17 NOTE — Progress Notes (Signed)
[employer] [facility] [place] at [facility]   PATIENT NAME: [name]    MR#:  [id number]  DATE OF BIRTH:  [birth year]  SUBJECTIVE:  CHIEF COMPLAINT:   Chief Complaint  Patient presents with  . Chest Pain  Waiting for cardiac catheterization later today REVIEW OF SYSTEMS:  Review of Systems  Constitutional: Negative for chills, fever and weight loss.  HENT: Negative for nosebleeds and sore throat.   Eyes: Negative for blurred vision.  Respiratory: Negative for cough, shortness of breath and wheezing.   Cardiovascular: Negative for orthopnea, leg swelling and PND.  Gastrointestinal: Negative for abdominal pain, constipation, diarrhea, heartburn, nausea and vomiting.  Genitourinary: Negative for dysuria and urgency.  Musculoskeletal: Negative for back pain.  Skin: Negative for rash.  Neurological: Negative for dizziness, speech change, focal weakness and headaches.  Endo/Heme/Allergies: Does not bruise/bleed easily.  Psychiatric/Behavioral: Negative for depression.   DRUG ALLERGIES:   Allergies  Allergen Reactions  . Aspirin Nausea And Vomiting and Other (See Comments)    Only happens when pt takes high doses.     VITALS:  Blood pressure 134/66, pulse (!) 56, temperature 97.8 F (36.6 C), resp. rate 16, height  (1.778 m), weight 81.6 kg (180 lb), SpO2 96 %. PHYSICAL EXAMINATION:  Physical Exam  Constitutional: He is oriented to person, place, and time and well-developed, well-nourished, and in no distress.  HENT:  Head: Normocephalic and atraumatic.  Eyes: Conjunctivae and EOM are normal. Pupils are equal, round, and reactive to light.  Neck: Normal range of motion. Neck supple. No tracheal deviation present. No thyromegaly present.  Cardiovascular: Normal rate, regular rhythm and normal heart sounds.   Pulmonary/Chest: Effort normal and breath sounds normal. No respiratory distress. He has no wheezes. He exhibits no tenderness.  Abdominal: Soft.  Bowel sounds are normal. He exhibits no distension. There is no tenderness.  Musculoskeletal: Normal range of motion.  BKA bilaterally  Neurological: He is alert and oriented to person, place, and time. No cranial nerve deficit.  Skin: Skin is warm and dry. No rash noted.  Psychiatric: Mood and affect normal.  Nursing note and vitals reviewed.  LABORATORY PANEL:   CBC  Recent Labs Lab 09/16/15 0427  WBC 7.5  HGB 12.5*  HCT 37.2*  PLT 133*   ------------------------------------------------------------------------------------------------------------------ Chemistries   Recent Labs Lab 09/16/15 0427  NA 142  K 3.7  CL 105  CO2 31  GLUCOSE 133*  BUN 9  CREATININE 0.75  CALCIUM 8.9  MG 1.8   RADIOLOGY:  No results found. ASSESSMENT AND PLAN:  This is a 60 year old male admitted for chest pain.  1. Chest pain/unstable angina - Non-STEMI Ruled out   - Monitor on telemetry - Continue Lipitor, Imdur, lisinopril and plavix - Full dose Lovenox - Stress test came back positive showing reversible ischemia, planned for cardiac catheterization today  2. Coronary artery disease: Continue Plavix. Cardio input appreciated  3. Diabetes mellitus type 2: Continue basal insulin reduced for hospital diet. continue sliding scale insulin.  4.  Hypokalemia - Replete and resolved  5. Hyperlipidemia: Continue statin therapy  6. Depression: Continue Abilify and fluoxetine as well as Klonopin and Remeron  7. Essential hypertension: Controlled; Continue hydralazine, lisinopril.  8. DVT prophylaxis: on Therapeutic anticoagulation   9. Continue gabapentin and Lyrica for neuropathy  10. GI prophylaxis: Pantoprazole per home regimen     All the records are reviewed and case discussed with Care Management/Social Worker. Management plans discussed with the patient, nursing  and they are in agreement.  CODE STATUS: Full code  TOTAL TIME TAKING CARE OF THIS PATIENT: 35 minutes.    More than 50% of the time was spent in counseling/coordination of care: YES  POSSIBLE D/C IN AM, DEPENDING ON CLINICAL CONDITION.  And cardiology evaluation - getting cardiac catheterization today   [facility], Semaya Vida M.D on 09/17/2015 at 12:51 PM  Between 7am to 6pm - Pager - [phone]  After 6pm go to [email or web address] - Social research officer, government  [facility] [place] [facility]  Office  [phone]  CC: Primary care physician; Presley Raddle, MD  Note: This dictation was prepared with Dragon dictation along with smaller phrase technology. Any transcriptional errors that result from this process are unintentional.

## 2015-09-17 NOTE — Progress Notes (Signed)
Inpatient Diabetes Program Recommendations  AACE/ADA: New Consensus Statement on Inpatient Glycemic Control (2015)  Target Ranges:  Prepandial:   less than 140 mg/dL      Peak postprandial:   less than 180 mg/dL (1-2 hours)      Critically ill patients:  140 - 180 mg/dL   Results for [name], [name] (MRN [id number]) as of 09/17/2015 12:43  Ref. Range 09/16/2015 07:24 09/16/2015 12:46 09/16/2015 16:47 09/16/2015 20:41 09/17/2015 07:36  Glucose-Capillary Latest Ref Range: 65 - 99 mg/dL 588 (H) 502 (H) 774 (H) 225 (H) 169 (H)   Review of Glycemic Control  Diabetes history: DM2 Outpatient Diabetes medications: Levemir 20 units QHS Current orders for Inpatient glycemic control: Levemir 6 units QHS, Novolog 0-15 units TID with meals  Inpatient Diabetes Program Recommendations: Correction (SSI): Please consider ordering Novolog bedtime correction scale. Insulin - Meal Coverage: Please consider ordering Novolog 3 units TID with meals for meal coverage if patient eats at least 50% of meals.  Thanks, [name], RN, MSN, CDE Diabetes Coordinator Inpatient Diabetes Program [phone] (Team Pager from 8am to 5pm) [phone] ([facility] office) [phone] [facility] office) [phone] [facility] office)

## 2015-09-17 NOTE — Progress Notes (Signed)
Pt resting without complaints band off and not bleeding, dressing dry and intact. Taking pos without difficulty, sr per monitor, report called to JEssica RN on telemetry with orders viewed, pt received aspirin and plavix in cath lab, finished angiomax gtt as requested per md.

## 2015-09-17 NOTE — Clinical Social Work Note (Signed)
MSW received phone call back from patient's group home coordinator [name] [phone].  Group home coordinator requested that discharge summary and FL2 be faxed to him at [phone] once patient is ready for discharge.  Patient will need EMS transport back to group home (see address below).   [facility] [address]  [place], [name] [address]  Ph   [phone]  Fax: [phone]   MSW to continue to follow patient's progress throughout discharge planning.  Ervin Knack. Corrado Hymon, MSW [phone]  Mon-Fri 8a-4:30p 09/17/2015 9:53 AM

## 2015-09-18 ENCOUNTER — Encounter: Payer: Self-pay | Admitting: Cardiology

## 2015-09-18 LAB — BASIC METABOLIC PANEL
Anion gap: 6 (ref 5–15)
BUN: 15 mg/dL (ref 6–20)
CALCIUM: 8.3 mg/dL — AB (ref 8.9–10.3)
CO2: 28 mmol/L (ref 22–32)
CREATININE: 0.85 mg/dL (ref 0.61–1.24)
Chloride: 107 mmol/L (ref 101–111)
GFR calc Af Amer: >60 mL/min (ref >60)
GLUCOSE: 109 mg/dL — AB (ref 65–99)
Potassium: 3.3 mmol/L — ABNORMAL LOW (ref 3.5–5.1)
SODIUM: 141 mmol/L (ref 135–145)

## 2015-09-18 LAB — GLUCOSE, CAPILLARY
Glucose-Capillary: 121 mg/dL — ABNORMAL HIGH (ref 65–99)
Glucose-Capillary: 173 mg/dL — ABNORMAL HIGH (ref 65–99)

## 2015-09-18 LAB — CBC
HCT: 36.2 % — ABNORMAL LOW (ref 40.0–52.0)
Hemoglobin: 12.5 g/dL — ABNORMAL LOW (ref 13.0–18.0)
MCH: 29.2 pg (ref 26.0–34.0)
MCHC: 34.5 g/dL (ref 32.0–36.0)
MCV: 84.7 fL (ref 80.0–100.0)
PLATELETS: 138 10*3/uL — AB (ref 150–440)
RBC: 4.27 MIL/uL — AB (ref 4.40–5.90)
RDW: 16.9 % — AB (ref 11.5–14.5)
WBC: 9.3 10*3/uL (ref 3.8–10.6)

## 2015-09-18 MED ORDER — ASPIRIN EC 81 MG PO TBEC
81.0000 mg | DELAYED_RELEASE_TABLET | Freq: Every day | ORAL | Status: DC
  Administered 2015-09-18: 81 mg via ORAL
  Filled 2015-09-18: qty 1

## 2015-09-18 MED ORDER — ASPIRIN 81 MG PO TBEC: 81.0000 mg | DELAYED_RELEASE_TABLET | Freq: Every day | ORAL | 0 refills | Status: AC

## 2015-09-18 MED ORDER — POTASSIUM CHLORIDE CRYS ER 20 MEQ PO TBCR
40.0000 meq | EXTENDED_RELEASE_TABLET | Freq: Once | ORAL | Status: AC
Start: 2015-09-18 — End: 2015-09-18
  Administered 2015-09-18: 40 meq via ORAL
  Filled 2015-09-18: qty 2

## 2015-09-18 MED ORDER — METOPROLOL SUCCINATE ER 25 MG PO TB24: 25.0000 mg | ORAL_TABLET | Freq: Every day | ORAL | 0 refills | Status: AC

## 2015-09-18 MED ORDER — ENSURE ENLIVE PO LIQD: 237.0000 mL | Freq: Two times a day (BID) | ORAL | 12 refills | Status: AC

## 2015-09-18 NOTE — Care Management (Signed)
Barbara Cower with [facility] notified of discharge and resumption of care.

## 2015-09-18 NOTE — Discharge Instructions (Signed)
Coronary Angiogram With Stent, Care After °Refer to this sheet in the next few weeks. These instructions provide you with information about caring for yourself after your procedure. Your health care provider may also give you more specific instructions. Your treatment has been planned according to current medical practices, but problems sometimes occur. Call your health care provider if you have any problems or questions after your procedure. °WHAT TO EXPECT AFTER THE PROCEDURE  °After your procedure, it is typical to have the following: °· Bruising at the catheter insertion site that usually fades within 1-2 weeks. °· Blood collecting in the tissue (hematoma) that may be painful to the touch. It should usually decrease in size and tenderness within 1-2 weeks. °HOME CARE INSTRUCTIONS °· Take medicines only as directed by your health care provider. Blood thinners may be prescribed after your procedure to improve blood flow through the stent. °· You may shower 24-48 hours after the procedure or as directed by your health care provider. Remove the bandage (dressing) and gently wash the catheter insertion site with plain soap and water. Pat the area dry with a clean towel. Do not rub the site, because this may cause bleeding. °· Do not take baths, swim, or use a hot tub until your health care provider approves. °· Check your catheter insertion site every day for redness, swelling, or drainage. °· Do not apply powder or lotion to the site. °· Do not lift over 10 lb (4.5 kg) for 5 days after your procedure or as directed by your health care provider. °· Ask your health care provider when it is okay to: °¨ Return to work or school. °¨ Resume usual physical activities or sports. °¨ Resume sexual activity. °· Eat a heart-healthy diet. This should include plenty of fresh fruits and vegetables. Meat should be lean cuts. Avoid the following types of food: °¨ Food that is high in salt. °¨ Canned or highly processed food. °¨ Food  that is high in saturated fat or sugar. °¨ Fried food. °· Make any other lifestyle changes as recommended by your health care provider. These may include: °¨ Not using any tobacco products, including cigarettes, chewing tobacco, or electronic cigarettes. If you need help quitting, ask your health care provider. °¨ Managing your weight. °¨ Getting regular exercise. °¨ Managing your blood pressure. °¨ Limiting your alcohol intake. °¨ Managing other health problems, such as diabetes. °· If you need an MRI after your heart stent has been placed, be sure to tell the health care provider who orders the MRI that you have a heart stent. °· Keep all follow-up visits as directed by your health care provider. This is important. °SEEK MEDICAL CARE IF: °· You have a fever. °· You have chills. °· You have increased bleeding from the catheter insertion site. Hold pressure on the site. °SEEK IMMEDIATE MEDICAL CARE IF: °· You develop chest pain or shortness of breath, feel faint, or pass out. °· You have unusual pain at the catheter insertion site. °· You have redness, warmth, or swelling at the catheter insertion site. °· You have drainage (other than a small amount of blood on the dressing) from the catheter insertion site. °· The catheter insertion site is bleeding, and the bleeding does not stop after 30 minutes of holding steady pressure on the site. °· You develop bleeding from any other place, such as from your rectum. There may be bright red blood in your urine or stool, or it may appear as black, tarry stool. °  °  This information is not intended to replace advice given to you by your health care provider. Make sure you discuss any questions you have with your health care provider. °  °Document Released: 08/20/2004 Document Revised: 02/21/2014 Document Reviewed: 06/25/2012 °Elsevier Interactive Patient Education ©2016 [employer] ° °

## 2015-09-18 NOTE — Progress Notes (Signed)
Discharge instructions given. New medications explained and education given on after care from catheterization. Questions answered and patient verbalized understanding.

## 2015-09-18 NOTE — Clinical Social Work Note (Signed)
Patient to be d/c'ed today to [facility].  Patient and caregiver agreeable to plans will transport via care giver's car around 1pm.  MSW updated patient, his caregiver, and bedside nurse.  Windell Moulding, MSW Mon-Fri 8a-4:30p [phone]
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
## 2015-09-21 ENCOUNTER — Encounter: Payer: Self-pay | Admitting: Emergency Medicine

## 2015-09-21 ENCOUNTER — Emergency Department
Admission: EM | Admit: 2015-09-21 | Discharge: 2015-09-22 | Disposition: A | Discharge status: Home / Self Care | Payer: Medicaid Other | Attending: Emergency Medicine | Admitting: Emergency Medicine

## 2015-09-21 DIAGNOSIS — R319 Hematuria, unspecified: Secondary | ICD-10-CM | POA: Diagnosis not present

## 2015-09-21 DIAGNOSIS — Z87891 Personal history of nicotine dependence: Secondary | ICD-10-CM | POA: Insufficient documentation

## 2015-09-21 DIAGNOSIS — J449 Chronic obstructive pulmonary disease, unspecified: Secondary | ICD-10-CM | POA: Insufficient documentation

## 2015-09-21 DIAGNOSIS — E119 Type 2 diabetes mellitus without complications: Secondary | ICD-10-CM | POA: Insufficient documentation

## 2015-09-21 DIAGNOSIS — N4889 Other specified disorders of penis: Secondary | ICD-10-CM | POA: Diagnosis present

## 2015-09-21 DIAGNOSIS — I10 Essential (primary) hypertension: Secondary | ICD-10-CM | POA: Diagnosis not present

## 2015-09-21 DIAGNOSIS — Z79899 Other long term (current) drug therapy: Secondary | ICD-10-CM | POA: Diagnosis not present

## 2015-09-21 DIAGNOSIS — Z7951 Long term (current) use of inhaled steroids: Secondary | ICD-10-CM | POA: Diagnosis not present

## 2015-09-21 DIAGNOSIS — N368 Other specified disorders of urethra: Secondary | ICD-10-CM | POA: Diagnosis not present

## 2015-09-21 DIAGNOSIS — Z7982 Long term (current) use of aspirin: Secondary | ICD-10-CM | POA: Diagnosis not present

## 2015-09-21 DIAGNOSIS — R001 Bradycardia, unspecified: Secondary | ICD-10-CM | POA: Insufficient documentation

## 2015-09-21 LAB — CBC WITH DIFFERENTIAL/PLATELET
BASOS ABS: 0.1 10*3/uL (ref 0–0.1)
Basophils Relative: 1 %
Eosinophils Absolute: 0.3 10*3/uL (ref 0–0.7)
Eosinophils Relative: 3 %
HCT: 35.4 % — ABNORMAL LOW (ref 40.0–52.0)
HEMOGLOBIN: 12.2 g/dL — AB (ref 13.0–18.0)
LYMPHS PCT: 19 %
Lymphs Abs: 1.6 10*3/uL (ref 1.0–3.6)
MCH: 28.8 pg (ref 26.0–34.0)
MCHC: 34.4 g/dL (ref 32.0–36.0)
MCV: 83.7 fL (ref 80.0–100.0)
MONO ABS: 0.5 10*3/uL (ref 0.2–1.0)
MONOS PCT: 6 %
NEUTROS ABS: 6 10*3/uL (ref 1.4–6.5)
NEUTROS PCT: 71 %
PLATELETS: 131 10*3/uL — AB (ref 150–440)
RBC: 4.23 MIL/uL — ABNORMAL LOW (ref 4.40–5.90)
RDW: 16.8 % — AB (ref 11.5–14.5)
WBC: 8.5 10*3/uL (ref 3.8–10.6)

## 2015-09-21 NOTE — ED Triage Notes (Addendum)
Pt presents to ED with c/o penile bleed. States noticed blood on underwear, also spots of blood when urinating. Pt reports mild penile pain. Pt states he was discharged from this hospital last Friday for MI.

## 2015-09-22 ENCOUNTER — Emergency Department: Payer: Medicaid Other

## 2015-09-22 LAB — URINALYSIS COMPLETE WITH MICROSCOPIC (ARMC ONLY)
Bacteria, UA: NONE SEEN
Bilirubin Urine: NEGATIVE
Glucose, UA: >500 mg/dL — AB
HGB URINE DIPSTICK: NEGATIVE
KETONES UR: NEGATIVE mg/dL
LEUKOCYTES UA: NEGATIVE
Nitrite: NEGATIVE
PH: 5 (ref 5.0–8.0)
PROTEIN: NEGATIVE mg/dL
SQUAMOUS EPITHELIAL / LPF: NONE SEEN
Specific Gravity, Urine: 1.02 (ref 1.005–1.030)

## 2015-09-22 LAB — BASIC METABOLIC PANEL
ANION GAP: 6 (ref 5–15)
BUN: 17 mg/dL (ref 6–20)
CHLORIDE: 101 mmol/L (ref 101–111)
CO2: 29 mmol/L (ref 22–32)
Calcium: 8.6 mg/dL — ABNORMAL LOW (ref 8.9–10.3)
Creatinine, Ser: 0.89 mg/dL (ref 0.61–1.24)
Glucose, Bld: 301 mg/dL — ABNORMAL HIGH (ref 65–99)
POTASSIUM: 3.6 mmol/L (ref 3.5–5.1)
Sodium: 136 mmol/L (ref 135–145)

## 2015-09-22 LAB — PROTIME-INR
INR: 1.02
PROTHROMBIN TIME: 13.4 s (ref 11.4–15.2)

## 2015-09-22 NOTE — ED Notes (Signed)
Patient transported to CT 

## 2015-09-22 NOTE — ED Notes (Signed)
[facility] "Laney Potashana" from the facility stated they were unable to provide transportation for the pt at this time and would not be able to pick the pt up until later in the AM. Report on pts discharge condition reported to "[place]."
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
## 2015-09-22 NOTE — Discharge Instructions (Signed)
1. Your heart rate was low without symptoms. Please take 1/2 metoprolol tablet daily (instead of whole tablet). 2. You may continue to take Plavix daily. 3. Return to the ER for worsening symptoms, persistent vomiting, feeling faint or other concerns.
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
## 2016-01-06 ENCOUNTER — Encounter (HOSPITAL_BASED_OUTPATIENT_CLINIC_OR_DEPARTMENT_OTHER): Payer: Self-pay

## 2016-01-15 DEATH — deceased
